# Patient Record
Sex: Female | Born: 1950 | Race: White | Hispanic: No | Marital: Single | Smoking: Never smoker
Health system: Southern US, Community
[De-identification: ages and names within clinical notes are randomized; demographics above are authoritative.]

## PROBLEM LIST (undated history)

## (undated) DIAGNOSIS — G43909 Migraine, unspecified, not intractable, without status migrainosus: Secondary | ICD-10-CM

## (undated) DIAGNOSIS — IMO0002 Reserved for concepts with insufficient information to code with codable children: Secondary | ICD-10-CM

## (undated) DIAGNOSIS — M1990 Unspecified osteoarthritis, unspecified site: Secondary | ICD-10-CM

## (undated) DIAGNOSIS — E282 Polycystic ovarian syndrome: Secondary | ICD-10-CM

## (undated) DIAGNOSIS — T148XXA Other injury of unspecified body region, initial encounter: Secondary | ICD-10-CM

## (undated) DIAGNOSIS — I10 Essential (primary) hypertension: Secondary | ICD-10-CM

## (undated) DIAGNOSIS — M797 Fibromyalgia: Secondary | ICD-10-CM

## (undated) DIAGNOSIS — S022XXA Fracture of nasal bones, initial encounter for closed fracture: Secondary | ICD-10-CM

## (undated) DIAGNOSIS — A0472 Enterocolitis due to Clostridium difficile, not specified as recurrent: Secondary | ICD-10-CM

## (undated) DIAGNOSIS — K5792 Diverticulitis of intestine, part unspecified, without perforation or abscess without bleeding: Secondary | ICD-10-CM

## (undated) DIAGNOSIS — K589 Irritable bowel syndrome without diarrhea: Secondary | ICD-10-CM

## (undated) DIAGNOSIS — E7800 Pure hypercholesterolemia, unspecified: Secondary | ICD-10-CM

## (undated) HISTORY — DX: Reserved for concepts with insufficient information to code with codable children: IMO0002

## (undated) HISTORY — DX: Fibromyalgia: M79.7

## (undated) HISTORY — PX: ROTATOR CUFF REPAIR: SHX139

## (undated) HISTORY — PX: SHOULDER SURGERY: SHX246

## (undated) HISTORY — DX: Unspecified osteoarthritis, unspecified site: M19.90

## (undated) HISTORY — DX: Enterocolitis due to Clostridium difficile, not specified as recurrent: A04.72

## (undated) HISTORY — DX: Other injury of unspecified body region, initial encounter: T14.8XXA

## (undated) HISTORY — DX: Fracture of nasal bones, initial encounter for closed fracture: S02.2XXA

---
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
## 1996-02-05 HISTORY — PX: CHOLECYSTECTOMY: SHX55

## 2010-09-27 ENCOUNTER — Encounter (HOSPITAL_BASED_OUTPATIENT_CLINIC_OR_DEPARTMENT_OTHER)
Admission: RE | Admit: 2010-09-27 | Discharge: 2010-09-27 | Disposition: A | Discharge status: Home / Self Care | Payer: Self-pay | Source: Ambulatory Visit | Attending: Orthopedic Surgery | Admitting: Orthopedic Surgery

## 2010-09-27 DIAGNOSIS — Z01812 Encounter for preprocedural laboratory examination: Secondary | ICD-10-CM | POA: Insufficient documentation

## 2010-09-27 DIAGNOSIS — Z01818 Encounter for other preprocedural examination: Secondary | ICD-10-CM | POA: Insufficient documentation

## 2010-09-27 LAB — BASIC METABOLIC PANEL
BUN: 15 mg/dL (ref 6–23)
CO2: 30 mEq/L (ref 19–32)
Calcium: 10.2 mg/dL (ref 8.4–10.5)
GFR calc non Af Amer: >60 mL/min (ref >60)
Glucose, Bld: 81 mg/dL (ref 70–99)
Potassium: 3.9 mEq/L (ref 3.5–5.1)
Sodium: 139 mEq/L (ref 135–145)

## 2010-10-01 ENCOUNTER — Ambulatory Visit (HOSPITAL_BASED_OUTPATIENT_CLINIC_OR_DEPARTMENT_OTHER)
Admission: RE | Admit: 2010-10-01 | Discharge: 2010-10-01 | Disposition: A | Discharge status: Home / Self Care | Payer: Worker's Compensation | Source: Ambulatory Visit | Attending: Orthopedic Surgery | Admitting: Orthopedic Surgery

## 2010-10-01 DIAGNOSIS — M719 Bursopathy, unspecified: Secondary | ICD-10-CM | POA: Insufficient documentation

## 2010-10-01 DIAGNOSIS — G43909 Migraine, unspecified, not intractable, without status migrainosus: Secondary | ICD-10-CM | POA: Insufficient documentation

## 2010-10-01 DIAGNOSIS — Z01812 Encounter for preprocedural laboratory examination: Secondary | ICD-10-CM | POA: Insufficient documentation

## 2010-10-01 DIAGNOSIS — I10 Essential (primary) hypertension: Secondary | ICD-10-CM | POA: Insufficient documentation

## 2010-10-01 DIAGNOSIS — Z01810 Encounter for preprocedural cardiovascular examination: Secondary | ICD-10-CM | POA: Insufficient documentation

## 2010-10-01 DIAGNOSIS — M25819 Other specified joint disorders, unspecified shoulder: Secondary | ICD-10-CM | POA: Insufficient documentation

## 2010-10-01 DIAGNOSIS — M67919 Unspecified disorder of synovium and tendon, unspecified shoulder: Secondary | ICD-10-CM | POA: Insufficient documentation

## 2010-10-01 DIAGNOSIS — M19019 Primary osteoarthritis, unspecified shoulder: Secondary | ICD-10-CM | POA: Insufficient documentation

## 2010-10-01 DIAGNOSIS — E119 Type 2 diabetes mellitus without complications: Secondary | ICD-10-CM | POA: Insufficient documentation

## 2010-10-01 LAB — GLUCOSE, CAPILLARY: Glucose-Capillary: 120 mg/dL — ABNORMAL HIGH (ref 70–99)

## 2010-10-03 NOTE — Op Note (Signed)
NAME:  [name], [name]            ACCOUNT NO.:  [id number]  MEDICAL RECORD NO.:  [id number]  LOCATION:                                 FACILITY:  PHYSICIAN:  Feliberto Gottron. Turner Daniels, M.D.   DATE OF BIRTH:  [birth year]  DATE OF PROCEDURE:  10/01/2010 DATE OF DISCHARGE:                              OPERATIVE REPORT   PREOPERATIVE DIAGNOSIS:  Right shoulder impingement syndrome, acromioclavicular joint arthritis, and partial thickness rotator cuff tear.  POSTOPERATIVE DIAGNOSIS:  Right shoulder impingement syndrome, acromioclavicular joint arthritis, and partial thickness rotator cuff tear.  PROCEDURES:  Right shoulder arthroscopic bursectomy, distal clavicle excision, anterior-inferior acromioplasty, debridement of external leaflet partial thickness rotator cuff tear.  SURGEON:  Feliberto Gottron. Turner Daniels, MD  FIRST ASSISTANT:  Shirl Harris, PA-C.  ANESTHESIA:  Interscalene block and general LMA.  ESTIMATED BLOOD LOSS:  Minimal.  FLUID REPLACEMENT:  800 mL of crystalloid.  DRAINS PLACED:  None.  TOURNIQUET TIME:  None.  INDICATIONS FOR PROCEDURE:  This is a 61 year old woman with previous history of right shoulder pain after injury at work.  MRI scan is consistent with a partial-thickness rotator cuff tear, possible pinhole tear in the infraspinatus.  She also has subacromial spurring type 2 about 7 mm in size, AC joint arthritis with subclavicular spurring as well.  She desires elective arthroscopic evaluation and treatment of her right shoulder.  Because of severe diabetes, she cannot take a cortisone injection and when she attempted physical therapy it only caused severe pain.  Risks and benefits of surgery were discussed and questions answered.  DESCRIPTION OF PROCEDURE:  The patient was identified by armband, taken to the holding area at [facility], and received preoperative IV antibiotics.  She also got a right shoulder interscalene block and was  taken to operating room 5, appropriate anesthetic monitors were attached, and general endotracheal anesthesia was induced with the patient in supine position.  She was then placed in the beach-chair position and the right upper extremity prepped and draped in the usual sterile fashion from the wrist to the hemithorax.  Time-out procedure was performed.  We began the operation by making standard portals 1.5 cm to the anterior [facility] joint, lateral to the junction of middle and posterior thirds acromion, posterior to the posterolateral corner of the acromion process.  The inflow was placed anteriorly, the arthroscope laterally, and a 4.2 [place] sucker shaver posteriorly allowing subacromial bursectomy.  We alternated with the ArthroCare wand to get hemostasis.  We immediately outlined a type 3 subacromial spur of the acromion and a subclavicular spur that was also quite impressive. Looking at the external leaflets of the rotator cuff, there was some superficial abrasion and little bit of tearing at the insertion, none of this was full-thickness and this was debrided back to a stable margin removing the outer 5-10% of the fibers.  Directing our attention back to the subacromial arch, we then removed the distal inferior centimeter of the clavicle with a 4.5 hooded vortex bur as well as the distal inferior tip of the acromion again with a 4.5 hooded vortex bur.  This created a flat type 1 acromion.  The [facility] joint itself was  noted to be arthritic and we then switched portals bringing the inflow in laterally, the scope posteriorly, and the bur anteriorly completing the distal clavicle excision.  We took one more look at the rotator cuff along its insertion and found it to be intact.  The arthroscope was then repositioned in the glenohumeral joint using the posterior portal where we documented normal glenohumeral cartilage, normal labrum, and normal biceps and biceps anchor.  The insertions of the  supraspinatus and infraspinatus were also noted to be intact.  At this point, the shoulder was irrigated out with normal saline solution.  The arthroscopic instruments were removed. Dressing of Xeroform, 4 x 4, dressing sponges, paper tape, and a sling was then applied.  The patient was awakened, extubated, and taken to the recovery room without difficulty.     Feliberto Gottron. Turner Daniels, M.D.     Ovid Curd  D:  10/01/2010  T:  10/01/2010  Job:  [id number]  Electronically Signed by Gean Birchwood M.D. on 10/03/2010 10:17:09 PM

## 2011-05-12 ENCOUNTER — Emergency Department (HOSPITAL_COMMUNITY): Payer: BC Managed Care – PPO

## 2011-05-12 ENCOUNTER — Encounter (HOSPITAL_COMMUNITY): Payer: Self-pay | Admitting: *Deleted

## 2011-05-12 ENCOUNTER — Emergency Department (HOSPITAL_COMMUNITY)
Admission: EM | Admit: 2011-05-12 | Discharge: 2011-05-12 | Disposition: A | Discharge status: Home / Self Care | Payer: BC Managed Care – PPO | Attending: Emergency Medicine | Admitting: Emergency Medicine

## 2011-05-12 DIAGNOSIS — R1031 Right lower quadrant pain: Secondary | ICD-10-CM | POA: Insufficient documentation

## 2011-05-12 DIAGNOSIS — E7800 Pure hypercholesterolemia, unspecified: Secondary | ICD-10-CM | POA: Insufficient documentation

## 2011-05-12 DIAGNOSIS — E119 Type 2 diabetes mellitus without complications: Secondary | ICD-10-CM | POA: Insufficient documentation

## 2011-05-12 DIAGNOSIS — R350 Frequency of micturition: Secondary | ICD-10-CM | POA: Insufficient documentation

## 2011-05-12 DIAGNOSIS — K5792 Diverticulitis of intestine, part unspecified, without perforation or abscess without bleeding: Secondary | ICD-10-CM

## 2011-05-12 DIAGNOSIS — K5732 Diverticulitis of large intestine without perforation or abscess without bleeding: Secondary | ICD-10-CM | POA: Insufficient documentation

## 2011-05-12 DIAGNOSIS — I10 Essential (primary) hypertension: Secondary | ICD-10-CM | POA: Insufficient documentation

## 2011-05-12 DIAGNOSIS — Z79899 Other long term (current) drug therapy: Secondary | ICD-10-CM | POA: Insufficient documentation

## 2011-05-12 HISTORY — DX: Pure hypercholesterolemia, unspecified: E78.00

## 2011-05-12 HISTORY — DX: Migraine, unspecified, not intractable, without status migrainosus: G43.909

## 2011-05-12 HISTORY — DX: Polycystic ovarian syndrome: E28.2

## 2011-05-12 HISTORY — DX: Essential (primary) hypertension: I10

## 2011-05-12 LAB — URINALYSIS, ROUTINE W REFLEX MICROSCOPIC
Glucose, UA: 250 mg/dL — AB
Ketones, ur: NEGATIVE mg/dL
Leukocytes, UA: NEGATIVE
Nitrite: NEGATIVE
Protein, ur: NEGATIVE mg/dL
Urobilinogen, UA: 0.2 mg/dL (ref 0.0–1.0)

## 2011-05-12 LAB — CBC
HCT: 41.2 % (ref 36.0–46.0)
Hemoglobin: 14.1 g/dL (ref 12.0–15.0)
MCV: 88.8 fL (ref 78.0–100.0)
WBC: 11.4 10*3/uL — ABNORMAL HIGH (ref 4.0–10.5)

## 2011-05-12 LAB — COMPREHENSIVE METABOLIC PANEL
BUN: 13 mg/dL (ref 6–23)
CO2: 30 mEq/L (ref 19–32)
Calcium: 9.8 mg/dL (ref 8.4–10.5)
Chloride: 101 mEq/L (ref 96–112)
Creatinine, Ser: 0.7 mg/dL (ref 0.50–1.10)
GFR calc Af Amer: >90 mL/min (ref >90)
GFR calc non Af Amer: >90 mL/min (ref >90)
Glucose, Bld: 114 mg/dL — ABNORMAL HIGH (ref 70–99)
Total Bilirubin: 0.5 mg/dL (ref 0.3–1.2)

## 2011-05-12 LAB — DIFFERENTIAL
Eosinophils Relative: 2 % (ref 0–5)
Lymphocytes Relative: 32 % (ref 12–46)
Monocytes Absolute: 1.1 10*3/uL — ABNORMAL HIGH (ref 0.1–1.0)
Monocytes Relative: 10 % (ref 3–12)
Neutro Abs: 6.4 10*3/uL (ref 1.7–7.7)

## 2011-05-12 LAB — LIPASE, BLOOD: Lipase: 34 U/L (ref 11–59)

## 2011-05-12 MED ORDER — HYDROCODONE-ACETAMINOPHEN 5-325 MG PO TABS: 2.0000 | ORAL_TABLET | ORAL | Status: AC | PRN

## 2011-05-12 MED ORDER — METRONIDAZOLE 500 MG PO TABS: 500.0000 mg | ORAL_TABLET | Freq: Three times a day (TID) | ORAL | Status: AC

## 2011-05-12 MED ORDER — CIPROFLOXACIN HCL 500 MG PO TABS: 500.0000 mg | ORAL_TABLET | Freq: Two times a day (BID) | ORAL | Status: AC

## 2011-05-12 NOTE — Discharge Instructions (Signed)
Your CT scan showed diverticulitis which is an infection of the colon mucosa. This is treated with antibiotics. You have been given a prescription for Cipro and Flagyl. Please take both of these until they are gone. The CT did show a "nonspecific lucency" of the L4 vertebra and that L4 is slipped slightly forward. Please call Dr. Tommas Olp office on Monday to schedule a follow up to recheck your abdomen and to discuss the CT findings. Return to the ER with worsening pain, high fever, abdominal swelling, or otherwise worsening condition.  RESOURCE GUIDE  Dental Problems  Patients with Medicaid: [facility] [phone] [address]                                           [phone] [address] Phone:  [phone]                                                  Phone:  [phone]  If unable to pay or uninsured, contact:  [facility] or [facility]. to become qualified for the adult dental clinic.  Chronic Pain Problems Contact [facility]  [phone] Patients need to be referred by their primary care doctor.  Insufficient Money for Medicine Contact [employer]:  call "211" or [facility] 419-785-4370.  No Primary Care Doctor Call [facility]  [phone] Other agencies that provide inexpensive medical care    [facility]  [phone]    [facility]  [phone]    [facility]  [phone]    [facility]  [phone]    [facility]  [phone]    [facility]  [phone]  Psychological Services [facility] Behavioral Health  [phone] [facility]  [phone] [facility] Mental [facility]   [phone] (emergency services [phone])  Substance Abuse Resources [facility]  [phone] [facility] 212-612-1260 [facility] 772-121-3000 Floydene Flock [phone] Residential & Outpatient Substance Abuse Program   ([phone]  Abuse/Neglect [facility] Child Abuse Hotline ([phone] 138-2081 [facility] Child Abuse Hotline 403-100-2100 (After Hours)  Emergency Shelter [facility] 5046425330  Maternity Homes [facility] [phone] [facility] [phone]  MRSA Hotline #:   434-175-5144    [facility] Resources  [facility]     [employer]                          [facility] Dept. [address] [place]                       [address]      [address]  Patrecia Pace  Michell Heinrich Phone:  [phone]                                   Phone:  [phone]                 Phone:  [phone]  [facility] Mental Health Phone:  [phone]  [facility] Child Abuse Hotline 819-460-3041 (867)061-8091 (After Hours)  Diverticulitis A diverticulum is a small pouch or sac on the colon. Diverticulosis is the presence of these diverticula on the colon. Diverticulitis is the irritation (inflammation) or infection of diverticula. CAUSES  The colon and its diverticula contain bacteria. If food particles block the tiny opening to a diverticulum, the bacteria inside can grow and cause an increase in pressure. This leads to infection and inflammation and is called diverticulitis. SYMPTOMS   Abdominal pain and tenderness. Usually, the pain is located on the left side of your abdomen. However, it could be located elsewhere.   Fever.   Bloating.   Feeling sick to your stomach (nausea).   Throwing up (vomiting).   Abnormal stools.  DIAGNOSIS  Your caregiver will take a history and perform a physical exam. Since many things can cause abdominal pain, other tests may be necessary. Tests may include:  Blood tests.   Urine tests.   X-ray of the abdomen.   CT scan of the abdomen.  Sometimes, surgery is  needed to determine if diverticulitis or other conditions are causing your symptoms. TREATMENT  Most of the time, you can be treated without surgery. Treatment includes:  Resting the bowels by only having liquids for a few days. As you improve, you will need to eat a low-fiber diet.   Intravenous (IV) fluids if you are losing body fluids (dehydrated).   Antibiotic medicines that treat infections may be given.   Pain and nausea medicine, if needed.   Surgery if the inflamed diverticulum has burst.  HOME CARE INSTRUCTIONS   Try a clear liquid diet (broth, tea, or water for as long as directed by your caregiver). You may then gradually begin a low-fiber diet as tolerated. A low-fiber diet is a diet with less than 10 grams of fiber. Choose the foods below to reduce fiber in the diet:   White breads, cereals, rice, and pasta.   Cooked fruits and vegetables or soft fresh fruits and vegetables without the skin.   Ground or well-cooked tender beef, ham, veal, lamb, pork, or poultry.   Eggs and seafood.   After your diverticulitis symptoms have improved, your caregiver may put you on a high-fiber diet. A high-fiber diet includes 14 grams of fiber for every 1000 calories consumed. For a standard 2000 calorie diet, you would need 28 grams of fiber. Follow these diet guidelines to help you increase the fiber in your diet. It is important to slowly increase the amount fiber in your diet to avoid gas, constipation, and bloating.   Choose whole-grain breads, cereals, pasta, and brown rice.   Choose fresh fruits and vegetables with the skin on. Do not overcook vegetables because the more vegetables are cooked, the more fiber is lost.   Choose more nuts, seeds, legumes, dried peas, beans, and lentils.   Look for food products that have greater than 3 grams of fiber per serving on the Nutrition Facts label.   Take all medicine as directed by your caregiver.   If your caregiver has given you  a  follow-up appointment, it is very important that you go. Not going could result in lasting (chronic) or permanent injury, pain, and disability. If there is any problem keeping the appointment, call to reschedule.  SEEK MEDICAL CARE IF:   Your pain does not improve.   You have a hard time advancing your diet beyond clear liquids.   Your bowel movements do not return to normal.  SEEK IMMEDIATE MEDICAL CARE IF:   Your pain becomes worse.   You have an oral temperature above 102 F (38.9 C), not controlled by medicine.   You have repeated vomiting.   You have bloody or black, tarry stools.   Symptoms that brought you to your caregiver become worse or are not getting better.  MAKE SURE YOU:   Understand these instructions.   Will watch your condition.   Will get help right away if you are not doing well or get worse.  Document Released: 10/31/2004 Document Revised: 01/10/2011 Document Reviewed: 02/26/2010 [facility] Patient Information 2012 [place], [place].

## 2011-05-12 NOTE — ED Notes (Signed)
MD/PA at bedside. 

## 2011-05-12 NOTE — ED Notes (Signed)
Pt reports she was sent her from [facility]. Pt thought she had a UTI and was seen at same Urgent Care on 3/29.  Pt diagnosed with UTI and took antibiotic and finished Friday. Pt reports symptoms returned. Pt had also stopped muscle relaxer on Wednesday. Pt reports bladder spasm and lower bilateral back pain. Pt sent here for evaluation of her spine. Pt has pictures on CD from urgent care and MD was concerned that patient may have compression in her lower vertebrae causing frequent urination.

## 2011-05-12 NOTE — ED Provider Notes (Signed)
8:00 PM  Signout from [place], [place]. Pt presents with abd cramping. Went to UC earlier today where XR was performed of LS which was of poor quality but showed ?lucency of the lower lumbar vertebrae and she was told that she may have a compression fx. She is awaiting CT without contrast of abd/pel to further eval abd and LS findings.  9:58 PM CT abd/pel resulted. Evidence of diverticulitis without abscess. Sm lucencies in T12 and L4, unclear etiology. I discussed findings with patient. She has a PCP, Dr. Zachery Dauer with [facility]. She is to call tomorrow to make an appt with her to discuss further workup. Will start on cipro/flagyl for abx coverage for diverticulitis. Pt comfortable currently, abd soft. She verbalized understanding and agreed to plan. Return precautions discussed.  Results for orders placed during the hospital encounter of 05/12/11  CBC      Component Value Range   WBC 11.4 (*) 4.0 - 10.5 (K/uL)   RBC 4.64  3.87 - 5.11 (MIL/uL)   Hemoglobin 14.1  12.0 - 15.0 (g/dL)   HCT 16.1  09.6 - 04.5 (%)   MCV 88.8  78.0 - 100.0 (fL)   MCH 30.4  26.0 - 34.0 (pg)   MCHC 34.2  30.0 - 36.0 (g/dL)   RDW 40.9  81.1 - 91.4 (%)   Platelets 203  150 - 400 (K/uL)  DIFFERENTIAL      Component Value Range   Neutrophils Relative 56  43 - 77 (%)   Neutro Abs 6.4  1.7 - 7.7 (K/uL)   Lymphocytes Relative 32  12 - 46 (%)   Lymphs Abs 3.6  0.7 - 4.0 (K/uL)   Monocytes Relative 10  3 - 12 (%)   Monocytes Absolute 1.1 (*) 0.1 - 1.0 (K/uL)   Eosinophils Relative 2  0 - 5 (%)   Eosinophils Absolute 0.2  0.0 - 0.7 (K/uL)   Basophils Relative 0  0 - 1 (%)   Basophils Absolute 0.0  0.0 - 0.1 (K/uL)  COMPREHENSIVE METABOLIC PANEL      Component Value Range   Sodium 140  135 - 145 (mEq/L)   Potassium 3.6  3.5 - 5.1 (mEq/L)   Chloride 101  96 - 112 (mEq/L)   CO2 30  19 - 32 (mEq/L)   Glucose, Bld 114 (*) 70 - 99 (mg/dL)   BUN 13  6 - 23 (mg/dL)   Creatinine, Ser 7.82  0.50 - 1.10 (mg/dL)   Calcium 9.8  8.4 -  10.5 (mg/dL)   Total Protein 7.5  6.0 - 8.3 (g/dL)   Albumin 3.7  3.5 - 5.2 (g/dL)   AST 12  0 - 37 (U/L)   ALT 10  0 - 35 (U/L)   Alkaline Phosphatase 105  39 - 117 (U/L)   Total Bilirubin 0.5  0.3 - 1.2 (mg/dL)   GFR calc non Af Amer >90  >90 (mL/min)   GFR calc Af Amer >90  >90 (mL/min)  LIPASE, BLOOD      Component Value Range   Lipase 34  11 - 59 (U/L)  URINALYSIS, ROUTINE W REFLEX MICROSCOPIC      Component Value Range   Color, Urine YELLOW  YELLOW    APPearance CLEAR  CLEAR    Specific Gravity, Urine 1.025  1.005 - 1.030    pH 6.0  5.0 - 8.0    Glucose, UA 250 (*) NEGATIVE (mg/dL)   Hgb urine dipstick NEGATIVE  NEGATIVE    Bilirubin Urine  NEGATIVE  NEGATIVE    Ketones, ur NEGATIVE  NEGATIVE (mg/dL)   Protein, ur NEGATIVE  NEGATIVE (mg/dL)   Urobilinogen, UA 0.2  0.0 - 1.0 (mg/dL)   Nitrite NEGATIVE  NEGATIVE    Leukocytes, UA NEGATIVE  NEGATIVE    Ct Abdomen Pelvis Wo Contrast  05/12/2011  *RADIOLOGY REPORT*  Clinical Data: Abdominal pain right lower quadrant and suprapubic region.  Outside film raised question of lucency L4 and L5.  CT ABDOMEN AND PELVIS WITHOUT CONTRAST  Technique:  Multidetector CT imaging of the abdomen and pelvis was performed following the standard protocol without intravenous contrast.  Comparison: Outside plain film examination not available.  Findings: Small lucencies within the T12 and L4 vertebral body. Etiology indeterminate.  In the proper clinical setting, metastatic disease or myeloma not excluded.  L4 bilateral facet joint degenerative changes with the elongated pars.  6 mm anterior slip of L4.  Sigmoid diverticula with inflammation medially consistent with diverticulitis without drainable abscess.  Post cholecystectomy.  Evaluation of solid abdominal viscera is limited by lack of IV contrast.  Taking this limitation into account no focal hepatic, splenic, pancreatic, adrenal or renal lesion.  Atherosclerotic type changes of the aorta with mild  ectasia without aneurysmal dilation.  Lung bases clear.  Urinary bladder, uterus and ovaries without gross abnormality.  No pelvic/abdominal adenopathy.  IMPRESSION: Sigmoid diverticulitis without drainable abscess.  Nonspecific subtle  lucencies within T12 and L4.  Please see above.  Original Report Authenticated By: Fuller Canada, M.D.      Grant Font[place], [place] 05/12/11 2202

## 2011-05-12 NOTE — ED Notes (Signed)
Patient transported to CT and returned 

## 2011-05-12 NOTE — ED Notes (Signed)
Pt sent to ED today from urgent care with complaint of lower abdominal and lower back pain. Pt reports pain currently a 3 out of 10, with it being a 6 out of 10 in the waiting room.  Pt denies N/V/D.  Pt was treated 1 week ago for UTI, finished antibiotics on Friday (05/10/11). Pt reports that pain began to increase on Friday. Pain is cramping, intermittent, and onset is frequently associated with need to have a bowel movement. Pt reports that walking around seems to ease but not eliminate the pain.

## 2011-05-12 NOTE — ED Provider Notes (Signed)
History     CSN: [id number]  Arrival date & time 05/12/11  1425  5:54 PM HPI Patient reports since Friday she thought she had a urinary tract infection. States symptoms include abdominal cramping. Went to [facility] urgent care and received Macrobid and an antispasmodic. Reports he finished his Antibiotics but still has severe abdominal cramping. States she went back to urgent care and was recommended to come to the emergency department after having lumbar x-rays. States she was told she may have a compression fracture in her last 2 vertebrae.  Patient is a 61 y.o. female presenting with cramps.  Abdominal Cramping The primary symptoms of the illness include abdominal pain. The primary symptoms of the illness do not include fever, nausea, vomiting, diarrhea, hematemesis, hematochezia, dysuria, vaginal discharge or vaginal bleeding. The onset of the illness was gradual. The problem has been gradually worsening.  The abdominal pain is located in the RLQ and suprapubic region. The abdominal pain radiates to the left flank, right flank and back. The severity of the abdominal pain is 10/10. The abdominal pain is relieved by nothing. The abdominal pain is exacerbated by movement.  Additional symptoms associated with the illness include urgency and back pain. Symptoms associated with the illness do not include chills, anorexia, diaphoresis, heartburn, constipation, hematuria or frequency.    Past Medical History  Diagnosis Date  . Hypertension   . Diabetes mellitus   . Hypercholesteremia   . 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 Migraines     Past Surgical History  Procedure Date  . Shoulder surgery   . Cholecystectomy   . Tonsillectomy     No family history on file.  History  Substance Use Topics  . Smoking status: Never Smoker   . Smokeless tobacco: Not on file  . Alcohol Use: No    OB History    Grav Para Term Preterm Abortions TAB SAB Ect Mult Living                  Review of Systems    Constitutional: Negative for fever, chills and diaphoresis.  HENT: Negative for neck pain and neck stiffness.   Cardiovascular: Negative for chest pain.  Gastrointestinal: Positive for abdominal pain. Negative for heartburn, nausea, vomiting, diarrhea, constipation, hematochezia, anorexia and hematemesis.  Genitourinary: Positive for urgency. Negative for dysuria, frequency, hematuria, flank pain, vaginal bleeding, vaginal discharge and pelvic pain.  Musculoskeletal: Positive for back pain.       Denies saddle anesthesias, or perineal numbness, urinary or bowel incontinence  Neurological: Negative for weakness, numbness and headaches.  All other systems reviewed and are negative.    Allergies  Azithromycin; Contrast media; and Prednisone  Home Medications   Current Outpatient Rx  Name Route Sig Dispense Refill  . ACETAMINOPHEN-CODEINE #3 300-30 MG PO TABS Oral Take 1 tablet by mouth every 4 (four) hours as needed. pain    . VITAMIN D 1000 UNITS PO TABS Oral Take 1,000 Units by mouth daily.    . CYCLOBENZAPRINE HCL 10 MG PO TABS Oral Take 5 mg by mouth 3 (three) times daily as needed. Muscle spasm    . GLIPIZIDE-METFORMIN HCL 2.5-500 MG PO TABS Oral Take 0.5 tablets by mouth 2 (two) times daily before a meal.    . HYDROCHLOROTHIAZIDE 25 MG PO TABS Oral Take 12.5 mg by mouth daily.    Marland Kitchen HYDROCODONE-ACETAMINOPHEN 7.5-500 MG PO TABS Oral Take 1 tablet by mouth every 6 (six) hours as needed. migraines    . LOVASTATIN 40  MG PO TABS Oral Take 60 mg by mouth daily.    Marland Kitchen NADOLOL 40 MG PO TABS Oral Take 40 mg by mouth daily.      BP 155/78  Pulse 80  Temp(Src) 98.7 F (37.1 C) (Oral)  Resp 16  SpO2 100%  Physical Exam  Vitals reviewed. Constitutional: She is oriented to person, place, and time. Vital signs are normal. She appears well-developed and well-nourished.  HENT:  Head: Normocephalic and atraumatic.  Eyes: Conjunctivae are normal. Pupils are equal, round, and reactive to  light.  Neck: Normal range of motion. Neck supple.  Cardiovascular: Normal rate, regular rhythm and normal heart sounds.  Exam reveals no friction rub.   No murmur heard. Pulmonary/Chest: Effort normal and breath sounds normal. She has no wheezes. She has no rhonchi. She has no rales. She exhibits no tenderness.  Abdominal: Soft. Bowel sounds are normal. She exhibits no distension and no mass. There is tenderness (lower abdominal pain). There is no rebound, no guarding and no CVA tenderness.  Musculoskeletal: Normal range of motion.       Lumbar back: She exhibits normal range of motion, no tenderness, no bony tenderness and no spasm.  Neurological: She is alert and oriented to person, place, and time. Coordination normal.  Skin: Skin is warm and dry. No rash noted. No erythema. No pallor.    ED Course  Procedures   Results for orders placed during the hospital encounter of 05/12/11  CBC      Component Value Range   WBC 11.4 (*) 4.0 - 10.5 (K/uL)   RBC 4.64  3.87 - 5.11 (MIL/uL)   Hemoglobin 14.1  12.0 - 15.0 (g/dL)   HCT 16.1  09.6 - 04.5 (%)   MCV 88.8  78.0 - 100.0 (fL)   MCH 30.4  26.0 - 34.0 (pg)   MCHC 34.2  30.0 - 36.0 (g/dL)   RDW 40.9  81.1 - 91.4 (%)   Platelets 203  150 - 400 (K/uL)  DIFFERENTIAL      Component Value Range   Neutrophils Relative 56  43 - 77 (%)   Neutro Abs 6.4  1.7 - 7.7 (K/uL)   Lymphocytes Relative 32  12 - 46 (%)   Lymphs Abs 3.6  0.7 - 4.0 (K/uL)   Monocytes Relative 10  3 - 12 (%)   Monocytes Absolute 1.1 (*) 0.1 - 1.0 (K/uL)   Eosinophils Relative 2  0 - 5 (%)   Eosinophils Absolute 0.2  0.0 - 0.7 (K/uL)   Basophils Relative 0  0 - 1 (%)   Basophils Absolute 0.0  0.0 - 0.1 (K/uL)  COMPREHENSIVE METABOLIC PANEL      Component Value Range   Sodium 140  135 - 145 (mEq/L)   Potassium 3.6  3.5 - 5.1 (mEq/L)   Chloride 101  96 - 112 (mEq/L)   CO2 30  19 - 32 (mEq/L)   Glucose, Bld 114 (*) 70 - 99 (mg/dL)   BUN 13  6 - 23 (mg/dL)    Creatinine, Ser 7.82  0.50 - 1.10 (mg/dL)   Calcium 9.8  8.4 - 95.6 (mg/dL)   Total Protein 7.5  6.0 - 8.3 (g/dL)   Albumin 3.7  3.5 - 5.2 (g/dL)   AST 12  0 - 37 (U/L)   ALT 10  0 - 35 (U/L)   Alkaline Phosphatase 105  39 - 117 (U/L)   Total Bilirubin 0.5  0.3 - 1.2 (mg/dL)   GFR calc  non Af Amer >90  >90 (mL/min)   GFR calc Af Amer >90  >90 (mL/min)  LIPASE, BLOOD      Component Value Range   Lipase 34  11 - 59 (U/L)  URINALYSIS, ROUTINE W REFLEX MICROSCOPIC      Component Value Range   Color, Urine YELLOW  YELLOW    APPearance CLEAR  CLEAR    Specific Gravity, Urine 1.025  1.005 - 1.030    pH 6.0  5.0 - 8.0    Glucose, UA 250 (*) NEGATIVE (mg/dL)   Hgb urine dipstick NEGATIVE  NEGATIVE    Bilirubin Urine NEGATIVE  NEGATIVE    Ketones, ur NEGATIVE  NEGATIVE (mg/dL)   Protein, ur NEGATIVE  NEGATIVE (mg/dL)   Urobilinogen, UA 0.2  0.0 - 1.0 (mg/dL)   Nitrite NEGATIVE  NEGATIVE    Leukocytes, UA NEGATIVE  NEGATIVE     MDM  Patient to [facility]. Report Dr. Eugenia Pancoast stated potential lytic lesion L4 and L5. Unable to determine this from CD imaging sent from urgent care. We'll further evaluate with CT abdomen/pelvis to evaluate for abdominal pathology and lumbar spine pathology. Discussed plan with Dr. Burns Spain, PA-C 05/12/11 2003

## 2011-05-12 NOTE — ED Notes (Signed)
Pt reports that her CBG this morning was 184. Pt refused to have CBG checked today in ED.

## 2011-05-13 NOTE — ED Provider Notes (Signed)
Medical screening examination/treatment/procedure(s) were performed by non-physician practitioner and as supervising physician I was immediately available for consultation/collaboration.   Loren Racer, MD 05/13/11 [phone]

## 2011-05-13 NOTE — ED Provider Notes (Signed)
Medical screening examination/treatment/procedure(s) were performed by non-physician practitioner and as supervising physician I was immediately available for consultation/collaboration.   Loren Racer, MD 05/13/11 [phone]

## 2011-05-14 LAB — URINE CULTURE: Colony Count: 30000

## 2011-05-24 ENCOUNTER — Other Ambulatory Visit: Payer: Self-pay | Admitting: Family Medicine

## 2011-05-24 DIAGNOSIS — R9389 Abnormal findings on diagnostic imaging of other specified body structures: Secondary | ICD-10-CM

## 2011-05-28 ENCOUNTER — Other Ambulatory Visit: Payer: Self-pay | Admitting: Family Medicine

## 2011-05-28 ENCOUNTER — Ambulatory Visit
Admission: RE | Admit: 2011-05-28 | Discharge: 2011-05-28 | Disposition: A | Discharge status: Home / Self Care | Payer: BC Managed Care – PPO | Source: Ambulatory Visit | Attending: Family Medicine | Admitting: Family Medicine

## 2011-05-28 DIAGNOSIS — R9389 Abnormal findings on diagnostic imaging of other specified body structures: Secondary | ICD-10-CM

## 2011-12-18 ENCOUNTER — Other Ambulatory Visit: Payer: Self-pay | Admitting: Cardiology

## 2011-12-18 MED ORDER — SODIUM CHLORIDE 0.9 % IV SOLN: 125.0000 mg | Freq: Once | INTRAVENOUS | Status: DC

## 2011-12-18 NOTE — H&P (Signed)
Patient: [name], [name] Provider: Donato Schultz, MD  DOB: Aug 08, 1950 Age: 61 Y Sex: Female Date: 12/18/2011  Phone: [phone]   Address: [address] [place], [place], [place]  Pcp: ELIZABETH BARNES       Subjective:     CC:    1. MS/stress test f/u.        HPI:  General:  61 year old here for evaluation of chest pain with medical history of diabetes, hypertension, hyperlipidemia. She's had a few episodes of substernal chest discomfort with associated shortness of breath. She had some episodes while walking uphill, on vacation. She had pressure while mowing the lawn a few years ago 2003 and because of the stopped mowing. Pressure like running into a wall. She was in [place]. A week after getting back felt mild discomfort when carry groceries. Hemoglobin A1c 7.4. EKG on 11/15/11 shows sinus rhythm rate 60 with no other abnormalities. LDL cholesterol 94.  Underwent stress test which demonstrated low to intermediate risk with distal inferolateral wall ischemia, normal EF. We discussed cardiac catheterization today. She was quite emotional..        ROS:  No bleeding, no syncope. Positive muscle twitches. All others negative.       Medical History: Diabetes, Hyperlipidemia, Hypertension, Fibromyalgia, History of Vitamin D deficiency, [place] syndrome.        Surgical History: right shoulder surgery 10/01/10 .        Family History: Father: CHF  Adopted.       Social History:  General:  History of smoking  cigarettes: Never smoked no Smoking, never.  no Alcohol.  no Recreational drug use.  Occupation: Music, worship, enjoys Archivist.        Medications: Vitamin D 2000 IU Tablet 1 tablet Once a day, Accu-Chek Aviva Not Specified Strip TEST AS DIRECTED DAILY , Flexeril 10 MG Tablet 1/4 to 1 pill up to three times per day, GlipiZIDE-Metformin HCl 2.5-500 MG Tablet 1/2 tab twice a day, Hydrochlorothiazide 25 MG Tablet 1/2 tablet Once a day, Nadolol 40 MG Not  Specified Tablet TAKE ONE TABLET BY MOUTH ONE TIME DAILY , Lovastatin 40 MG Tablet 1 1/2 tablet with a meal qd, Lortab 7.5 7.5-500 MG Tablet 1 tablet as needed for pain every 6 hrs prn, Tylenol/Codeine #3 300-30 MG Tablet 1 tablet bid prn pain, Medication List reviewed and reconciled with the patient       Allergies: steroids, aspirin, Sulfa drugs (for allergy), Erythromycin: Hives, Naprosyn: nose bleeds and stomach upset, Augmentin: headaches and nausea, All Nsaids, IV DYE: hives, difficulty breathing.       Objective:     Vitals: Wt Refused, Pulse sitting 80, BP sitting 144/90.       Examination:  General Examination:  GENERAL APPEARANCE alert, oriented, NAD, pleasant, anxious.  SKIN: normal, no rash.  HEENT: normal.  HEAD: Downs/AT.  EYES: EOMI, Conjunctiva clear.  NECK: supple, FROM, without evidence of thyromegaly, adenopathy, or bruits, no jugular venous distention (JVD).  LUNGS: clear to auscultation bilaterally, no wheezes, rhonchi, rales, regular breathing rate and effort.  HEART: regular rate and rhythm, no S3, S4, murmur or rub, point of maximul impulse (PMI) normal.  ABDOMEN: soft, non-tender/non-distended, bowel sounds present, no masses palpated, no bruit.  EXTREMITIES: no clubbing, no edema, pulses 2 plus bilaterally.  NEUROLOGIC EXAM: non-focal exam, alert and oriented x 3.  PERIPHERAL PULSES: normal (2+) bilaterally.  LYMPH NODES: no cervical adenopathy.  PSYCH affect normal.  PE done at last visit. Radial pulse 2+, Prior  EKG, lab work, medical records reviewed.       Assessment:     Assessment:  1. Other nonspecific abnormal cardiovascular system function study - 794.39 (Primary)  2. Chest heaviness - 786.59  3. Hypercholesterolemia, Mixed - 272.2  4. Essential hypertension, benign - 401.1  5. 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 Other nonspecific abnormal cardiovascular system function study  LAB: Basic Metabolic    GLUCOSE 224  70-99 - mg/dL  H   BUN 16  1-61 - mg/dL    CREATININE 0.96  0.45-4.09 - mg/dl    eGFR (NON-AFRICAN AMERICAN) 76 >60 - calc    eGFR (AFRICAN AMERICAN) 92 >60 - calc    SODIUM 139  136-145 - mmol/L    POTASSIUM 3.9  3.5-5.5 - mmol/L    CHLORIDE 103  98-107 - mmol/L    C02 30  22-32 - mmol/L    ANION GAP 10.0 6.0-20.0 - mmol/L    CALCIUM 9.4  8.6-10.3 - mg/dL     [facility] 81/19/1478 12:38:56 PM > ok for cath    LAB: CBC with Diff    WBC 7.8 4.0-11.0 - K/ul    RBC 4.65 4.20-5.40 - M/uL    HGB 14.3 12.0-16.0 - g/dL    HCT 29.5 62.1-30.8 - %    MCH 30.8 27.0-33.0 - pg    MPV 6.8 7.5-10.7 - fL L   MCV 87.4 81.0-99.0 - fL    MCHC 35.2 32.0-36.0 - g/dL    RDW 65.7 84.6-96.2 - %    NRBC# 0.00 -    PLT 209 150-400 - K/uL    NEUT % 53.4 43.3-71.9 - %    NRBC% 0.00 - %    LYMPH% 35.6 16.8-43.5 - %    MONO % 8.0 4.6-12.4 - %    EOS % 2.0 0.0-7.8 - %    BASO % 1.0 0.0-1.0 - %    NEUT # 4.1 1.9-7.2 - K/uL    LYMPH# 2.80 1.10-2.70 - K/uL H   MONO # 0.6 0.3-0.8 - K/uL    EOS # 0.2 0.0-0.6 - K/uL    BASO # 0.1 0.0-0.1 - K/uL     Reverie Vaquera 12/18/2011 12:38:56 PM > ok for cath    LAB: PT (Prothrombin Time) (952841)    Prothrombin Time 9.5 9.1-12.0 - SEC    INR 0.9 0.8-1.2 -     Baelynn Schmuhl 12/18/2011 12:38:56 PM > ok for cath    Low to intermediate risk study with inferolateral distal ischemia. Anginal class II. Diabetic. We will proceed with catheterization. Risks and benefits of cardiac catheterization have been reviewed including risk of stroke, heart attack, death, bleeding, renal impariment and arterial damage. There was ample oppurtuny to answer questions. Alternatives were discussed. Patient understands and wishes to proceed.  Radial access. Will give Solumedrol, in 1980's had hives with CT scan.        Procedures:  Venipuncture:  Venipuncture: performed in right arm, performed in right arm, Howell,Kathleen 12/18/2011 10:45:22 AM > .        Immunizations:        Labs:         Procedure Codes: 32440 ECL BMP, 85025 ECL CBC PLATELET DIFF, 10272 BLOOD COLLECTION ROUTINE VENIPUNCTURE       Preventive:         Follow Up: post cath      Provider: Donato Schultz, MD  Patient: [name], [name] DOB: [birth year] Date: 12/18/2011

## 2011-12-19 ENCOUNTER — Encounter (HOSPITAL_COMMUNITY)
Admission: RE | Disposition: A | Discharge status: Home / Self Care | Payer: Self-pay | Source: Ambulatory Visit | Attending: Cardiology

## 2011-12-19 ENCOUNTER — Ambulatory Visit (HOSPITAL_COMMUNITY)
Admission: RE | Admit: 2011-12-19 | Discharge: 2011-12-19 | Disposition: A | Discharge status: Home / Self Care | Payer: BC Managed Care – PPO | Source: Ambulatory Visit | Attending: Cardiology | Admitting: Cardiology

## 2011-12-19 DIAGNOSIS — E1165 Type 2 diabetes mellitus with hyperglycemia: Secondary | ICD-10-CM | POA: Diagnosis present

## 2011-12-19 DIAGNOSIS — I10 Essential (primary) hypertension: Secondary | ICD-10-CM | POA: Insufficient documentation

## 2011-12-19 DIAGNOSIS — R9439 Abnormal result of other cardiovascular function study: Secondary | ICD-10-CM | POA: Diagnosis present

## 2011-12-19 DIAGNOSIS — R079 Chest pain, unspecified: Secondary | ICD-10-CM | POA: Insufficient documentation

## 2011-12-19 DIAGNOSIS — E785 Hyperlipidemia, unspecified: Secondary | ICD-10-CM | POA: Insufficient documentation

## 2011-12-19 DIAGNOSIS — R0602 Shortness of breath: Secondary | ICD-10-CM | POA: Insufficient documentation

## 2011-12-19 DIAGNOSIS — E119 Type 2 diabetes mellitus without complications: Secondary | ICD-10-CM | POA: Insufficient documentation

## 2011-12-19 DIAGNOSIS — E282 Polycystic ovarian syndrome: Secondary | ICD-10-CM | POA: Insufficient documentation

## 2011-12-19 HISTORY — PX: LEFT HEART CATHETERIZATION WITH CORONARY ANGIOGRAM: SHX5451

## 2011-12-19 LAB — GLUCOSE, CAPILLARY: Glucose-Capillary: 110 mg/dL — ABNORMAL HIGH (ref 70–99)

## 2011-12-19 SURGERY — LEFT HEART CATHETERIZATION WITH CORONARY ANGIOGRAM
Anesthesia: LOCAL

## 2011-12-19 MED ORDER — SODIUM CHLORIDE 0.9 % IV SOLN: 1.0000 mL/kg/h | INTRAVENOUS | Status: AC

## 2011-12-19 MED ORDER — FAMOTIDINE IN NACL 20-0.9 MG/50ML-% IV SOLN
20.0000 mg | INTRAVENOUS | Status: AC
  Administered 2011-12-19: 20 mg via INTRAVENOUS
  Filled 2011-12-19: qty 50

## 2011-12-19 MED ORDER — ACETAMINOPHEN 325 MG PO TABS: 650.0000 mg | ORAL_TABLET | ORAL | Status: DC | PRN

## 2011-12-19 MED ORDER — METHYLPREDNISOLONE SODIUM SUCC 125 MG IJ SOLR
INTRAMUSCULAR | Status: AC
  Filled 2011-12-19: qty 2

## 2011-12-19 MED ORDER — HEPARIN (PORCINE) IN NACL 2-0.9 UNIT/ML-% IJ SOLN
INTRAMUSCULAR | Status: AC
  Filled 2011-12-19: qty 1000

## 2011-12-19 MED ORDER — LIDOCAINE HCL (PF) 1 % IJ SOLN
INTRAMUSCULAR | Status: AC
  Filled 2011-12-19: qty 30

## 2011-12-19 MED ORDER — DIPHENHYDRAMINE HCL 50 MG/ML IJ SOLN
25.0000 mg | INTRAMUSCULAR | Status: AC
  Administered 2011-12-19: 25 mg via INTRAVENOUS
  Filled 2011-12-19: qty 1

## 2011-12-19 MED ORDER — ASPIRIN 81 MG PO CHEW
324.0000 mg | CHEWABLE_TABLET | ORAL | Status: AC
  Administered 2011-12-19: 324 mg via ORAL
  Filled 2011-12-19: qty 4

## 2011-12-19 MED ORDER — NITROGLYCERIN 0.2 MG/ML ON CALL CATH LAB
INTRAVENOUS | Status: AC
  Filled 2011-12-19: qty 1

## 2011-12-19 MED ORDER — ONDANSETRON HCL 4 MG/2ML IJ SOLN: 4.0000 mg | Freq: Four times a day (QID) | INTRAMUSCULAR | Status: DC | PRN

## 2011-12-19 MED ORDER — SODIUM CHLORIDE 0.9 % IV SOLN
INTRAVENOUS | Status: DC
  Administered 2011-12-19: 09:00:00 via INTRAVENOUS

## 2011-12-19 MED ORDER — FENTANYL CITRATE 0.05 MG/ML IJ SOLN
INTRAMUSCULAR | Status: AC
  Filled 2011-12-19: qty 2

## 2011-12-19 MED ORDER — SODIUM CHLORIDE 0.9 % IV SOLN: 250.0000 mL | INTRAVENOUS | Status: DC | PRN

## 2011-12-19 MED ORDER — DIAZEPAM 5 MG PO TABS
5.0000 mg | ORAL_TABLET | ORAL | Status: AC
  Administered 2011-12-19: 5 mg via ORAL
  Filled 2011-12-19: qty 1

## 2011-12-19 MED ORDER — SODIUM CHLORIDE 0.9 % IJ SOLN: 3.0000 mL | Freq: Two times a day (BID) | INTRAMUSCULAR | Status: DC

## 2011-12-19 MED ORDER — SODIUM CHLORIDE 0.9 % IJ SOLN: 3.0000 mL | INTRAMUSCULAR | Status: DC | PRN

## 2011-12-19 MED ORDER — MIDAZOLAM HCL 2 MG/2ML IJ SOLN
INTRAMUSCULAR | Status: AC
  Filled 2011-12-19: qty 2

## 2011-12-19 NOTE — Interval H&P Note (Signed)
History and Physical Interval Note:  12/19/2011 11:13 AM  [name]  has presented today for surgery, with the diagnosis of abnormal stress test  The various methods of treatment have been discussed with the patient and family. After consideration of risks, benefits and other options for treatment, the patient has consented to  Procedure(s) (LRB) with comments: LEFT HEART CATHETERIZATION WITH CORONARY ANGIOGRAM (N/A) as a surgical intervention .  The patient's history has been reviewed, patient examined, no change in status, stable for surgery.  I have reviewed the patient's chart and labs.  Questions were answered to the patient's satisfaction.     SKAINS, MARK

## 2011-12-19 NOTE — CV Procedure (Signed)
CARDIAC CATHETERIZATION  PROCEDURE:  Left heart catheterization with selective coronary angiography, left ventriculogram.  INDICATIONS:  61 year old female with diabetes who recently was experiencing chest heaviness during heavy exertion on a trip to [place]. Nuclear stress test was performed and showed a distal anterolateral defect that was reversible. Breast attenuation was present however ischemia cannot be excluded. This test was low to intermediate risk. Anginal class 1-2.  The risks, benefits, and details of the procedure were explained to the patient.  The patient verbalized understanding and wanted to proceed.  Informed written consent was obtained.  PROCEDURE TECHNIQUE:  After Xylocaine anesthesia and visualization of the femoral head via fluoroscopy, a 17F sheath was placed in the right femoral artery with a single anterior needle wall stick.   Left coronary angiography was done using a Judkins L4 catheter.  Right coronary angiography was done using a Judkins R4 catheter.  Left ventriculography was done using a pigtail catheter.    CONTRAST:  Total of 55 ml.  FLOUROSCOPY TIME: 1.4 minutes.   COMPLICATIONS:  None.    HEMODYNAMICS:  Aortic pressure was 128/34mmHg; LV systolic pressure was ; LVEDP .  There was no gradient between the left ventricle and aorta.    ANGIOGRAPHIC DATA:    Left main: No angiographically significant disease, branches into LAD and circumflex  Left anterior descending (LAD): No angiographically significant disease, distal vessel is small in caliber one significant branching diagonal. LAD wraps around the apex.  Circumflex artery (CIRC): Large-caliber vessel with 2 distal obtuse marginal branches. One high obtuse marginal branch, small in caliber.  Right coronary artery (RCA): Dominant vessel giving rise to the posterior descending artery. The distal vasculature is small in caliber, PDA is small in caliber. There is minor calcification at the ostium  of the RCA. The main proximal mid and distal RCA is widely patent with no irregularities.  LEFT VENTRICULOGRAM:  Left ventricular angiogram was done in the 30 RAO projection and revealed normal left ventricular wall motion and systolic function with an estimated ejection fraction of 55%.   IMPRESSIONS:  1. No angiographically significant coronary artery disease. Distal vasculature is small in caliber, likely as a result of diabetes. 2. Normal left ventricular systolic function.  LVEDP 9 mmHg.  Ejection fraction 55%.  RECOMMENDATION:  Reassuring heart catheterization. Continue aggressive risk factor modification, diabetes, blood pressure, cholesterol control. It is certainly plausible that her chest discomfort was musculoskeletal in etiology. Nuclear stress test perfusion defect likely in large part from breast attenuation artifact.

## 2011-12-19 NOTE — H&P (View-Only) (Signed)
Patient: Sexton Sexton Provider: Maryl Blalock, MD  DOB: 06/15/1950 Age: 61 Y Sex: Female Date: 12/18/2011  Phone: [phone]   Address: [address], , [place]  Pcp: ELIZABETH BARNES       Subjective:     CC:    1. MS/stress test f/u.        HPI:  General:  61-year-old here for evaluation of chest pain with medical history of diabetes, hypertension, hyperlipidemia. She's had Sexton few episodes of substernal chest discomfort with associated shortness of breath. She had some episodes while walking uphill, on vacation. She had pressure while mowing the lawn Sexton few years ago 2003 and because of the stopped mowing. Pressure like running into Sexton wall. She was in [place]. Sexton week after getting back felt mild discomfort when carry groceries. Hemoglobin A1c 7.4. EKG on 11/15/11 shows sinus rhythm rate 60 with no other abnormalities. LDL cholesterol 94.  Underwent stress test which demonstrated low to intermediate risk with distal inferolateral wall ischemia, normal EF. We discussed cardiac catheterization today. She was quite emotional..        ROS:  No bleeding, no syncope. Positive muscle twitches. All others negative.       Medical History: Diabetes, Hyperlipidemia, Hypertension, Fibromyalgia, History of Vitamin D deficiency, Stein Leventhal syndrome.        Surgical History: right shoulder surgery 10/01/10 .        Family History: Father: CHF  Adopted.       Social History:  General:  History of smoking  cigarettes: Never smoked no Smoking, never.  no Alcohol.  no Recreational drug use.  Occupation: Music, worship, enjoys knitting.        Medications: Vitamin D 2000 IU Tablet 1 tablet Once Sexton day, Accu-Chek Aviva Not Specified Strip TEST AS DIRECTED DAILY , Flexeril 10 MG Tablet 1/4 to 1 pill up to three times per day, GlipiZIDE-Metformin HCl 2.5-500 MG Tablet 1/2 tab twice Sexton day, Hydrochlorothiazide 25 MG Tablet 1/2 tablet Once Sexton day, Nadolol 40 MG Not  Specified Tablet TAKE ONE TABLET BY MOUTH ONE TIME DAILY , Lovastatin 40 MG Tablet 1 1/2 tablet with Sexton meal qd, Lortab 7.5 7.5-500 MG Tablet 1 tablet as needed for pain every 6 hrs prn, Tylenol/Codeine #3 300-30 MG Tablet 1 tablet bid prn pain, Medication List reviewed and reconciled with the patient       Allergies: steroids, aspirin, Sulfa drugs (for allergy), Erythromycin: Hives, Naprosyn: nose bleeds and stomach upset, Augmentin: headaches and nausea, All Nsaids, IV DYE: hives, difficulty breathing.       Objective:     Vitals: Wt Refused, Pulse sitting 80, BP sitting 144/90.       Examination:  General Examination:  GENERAL APPEARANCE alert, oriented, NAD, pleasant, anxious.  SKIN: normal, no rash.  HEENT: normal.  HEAD: /AT.  EYES: EOMI, Conjunctiva clear.  NECK: supple, FROM, without evidence of thyromegaly, adenopathy, or bruits, no jugular venous distention (JVD).  LUNGS: clear to auscultation bilaterally, no wheezes, rhonchi, rales, regular breathing rate and effort.  HEART: regular rate and rhythm, no S3, S4, murmur or rub, point of maximul impulse (PMI) normal.  ABDOMEN: soft, non-tender/non-distended, bowel sounds present, no masses palpated, no bruit.  EXTREMITIES: no clubbing, no edema, pulses 2 plus bilaterally.  NEUROLOGIC EXAM: non-focal exam, alert and oriented x 3.  PERIPHERAL PULSES: normal (2+) bilaterally.  LYMPH NODES: no cervical adenopathy.  PSYCH affect normal.  PE done at last visit. Radial pulse 2+, Prior   EKG, lab work, medical records reviewed.       Assessment:     Assessment:  1. Other nonspecific abnormal cardiovascular system function study - 794.39 (Primary)  2. Chest heaviness - 786.59  3. Hypercholesterolemia, Mixed - 272.2  4. Essential hypertension, benign - 401.1  5. 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 Other nonspecific abnormal cardiovascular system function study  LAB: Basic Metabolic    GLUCOSE 224  70-99 - mg/dL  H   BUN 16  6-26 - mg/dL    CREATININE 0.77  0.60-1.30 - mg/dl    eGFR (NON-AFRICAN AMERICAN) 76 >60 - calc    eGFR (AFRICAN AMERICAN) 92 >60 - calc    SODIUM 139  136-145 - mmol/L    POTASSIUM 3.9  3.5-5.5 - mmol/L    CHLORIDE 103  98-107 - mmol/L    C02 30  22-32 - mmol/L    ANION GAP 10.0 6.0-20.0 - mmol/L    CALCIUM 9.4  8.6-10.3 - mg/dL     Sexton Sexton 12/18/2011 12:38:56 PM > ok for cath    LAB: CBC with Diff    WBC 7.8 4.0-11.0 - K/ul    RBC 4.65 4.20-5.40 - M/uL    HGB 14.3 12.0-16.0 - g/dL    HCT 40.7 37.0-47.0 - %    MCH 30.8 27.0-33.0 - pg    MPV 6.8 7.5-10.7 - fL L   MCV 87.4 81.0-99.0 - fL    MCHC 35.2 32.0-36.0 - g/dL    RDW 13.2 11.5-15.5 - %    NRBC# 0.00 -    PLT 209 150-400 - K/uL    NEUT % 53.4 43.3-71.9 - %    NRBC% 0.00 - %    LYMPH% 35.6 16.8-43.5 - %    MONO % 8.0 4.6-12.4 - %    EOS % 2.0 0.0-7.8 - %    BASO % 1.0 0.0-1.0 - %    NEUT # 4.1 1.9-7.2 - K/uL    LYMPH# 2.80 1.10-2.70 - K/uL H   MONO # 0.6 0.3-0.8 - K/uL    EOS # 0.2 0.0-0.6 - K/uL    BASO # 0.1 0.0-0.1 - K/uL     Sexton Sexton 12/18/2011 12:38:56 PM > ok for cath    LAB: PT (Prothrombin Time) (005199)    Prothrombin Time 9.5 9.1-12.0 - SEC    INR 0.9 0.8-1.2 -     Sexton Sexton 12/18/2011 12:38:56 PM > ok for cath    Low to intermediate risk study with inferolateral distal ischemia. Anginal class II. Diabetic. We will proceed with catheterization. Risks and benefits of cardiac catheterization have been reviewed including risk of stroke, heart attack, death, bleeding, renal impariment and arterial damage. There was ample oppurtuny to answer questions. Alternatives were discussed. Patient understands and wishes to proceed.  Radial access. Will give Solumedrol, in 1980's had hives with CT scan.        Procedures:  Venipuncture:  Venipuncture: performed in right arm, performed in right arm, Howell,Kathleen 12/18/2011 10:45:22 AM > .        Immunizations:        Labs:         Procedure Codes: 80048 ECL BMP, 85025 ECL CBC PLATELET DIFF, 36415 BLOOD COLLECTION ROUTINE VENIPUNCTURE       Preventive:         Follow Up: post cath      Provider: Mialynn Shelvin, MD  Patient: Sexton Sexton Sexton DOB: 07/16/1950 Date: 12/18/2011    

## 2013-09-09 ENCOUNTER — Encounter: Payer: Self-pay | Admitting: *Deleted

## 2014-01-13 ENCOUNTER — Encounter (HOSPITAL_COMMUNITY): Payer: Self-pay | Admitting: Cardiology

## 2014-03-07 ENCOUNTER — Other Ambulatory Visit: Payer: Self-pay | Admitting: Family Medicine

## 2014-03-07 ENCOUNTER — Ambulatory Visit
Admission: RE | Admit: 2014-03-07 | Discharge: 2014-03-07 | Disposition: A | Discharge status: Home / Self Care | Payer: BLUE CROSS/BLUE SHIELD | Source: Ambulatory Visit | Attending: Family Medicine | Admitting: Family Medicine

## 2014-03-07 DIAGNOSIS — R1032 Left lower quadrant pain: Secondary | ICD-10-CM

## 2014-04-07 DIAGNOSIS — M542 Cervicalgia: Secondary | ICD-10-CM | POA: Diagnosis not present

## 2014-04-11 DIAGNOSIS — M542 Cervicalgia: Secondary | ICD-10-CM | POA: Diagnosis not present

## 2014-04-13 DIAGNOSIS — M542 Cervicalgia: Secondary | ICD-10-CM | POA: Diagnosis not present

## 2014-04-18 DIAGNOSIS — M542 Cervicalgia: Secondary | ICD-10-CM | POA: Diagnosis not present

## 2014-04-20 DIAGNOSIS — M542 Cervicalgia: Secondary | ICD-10-CM | POA: Diagnosis not present

## 2014-04-22 DIAGNOSIS — M542 Cervicalgia: Secondary | ICD-10-CM | POA: Diagnosis not present

## 2014-04-25 DIAGNOSIS — M542 Cervicalgia: Secondary | ICD-10-CM | POA: Diagnosis not present

## 2014-05-02 DIAGNOSIS — M542 Cervicalgia: Secondary | ICD-10-CM | POA: Diagnosis not present

## 2014-05-09 DIAGNOSIS — M542 Cervicalgia: Secondary | ICD-10-CM | POA: Diagnosis not present

## 2014-05-11 DIAGNOSIS — M542 Cervicalgia: Secondary | ICD-10-CM | POA: Diagnosis not present

## 2014-05-16 DIAGNOSIS — M542 Cervicalgia: Secondary | ICD-10-CM | POA: Diagnosis not present

## 2014-05-19 DIAGNOSIS — M542 Cervicalgia: Secondary | ICD-10-CM | POA: Diagnosis not present

## 2014-06-07 DIAGNOSIS — M542 Cervicalgia: Secondary | ICD-10-CM | POA: Diagnosis not present

## 2014-06-07 DIAGNOSIS — M4712 Other spondylosis with myelopathy, cervical region: Secondary | ICD-10-CM | POA: Diagnosis not present

## 2014-06-18 DIAGNOSIS — M542 Cervicalgia: Secondary | ICD-10-CM | POA: Diagnosis not present

## 2014-06-21 DIAGNOSIS — M542 Cervicalgia: Secondary | ICD-10-CM | POA: Diagnosis not present

## 2014-06-22 DIAGNOSIS — M47812 Spondylosis without myelopathy or radiculopathy, cervical region: Secondary | ICD-10-CM | POA: Diagnosis not present

## 2014-07-06 DIAGNOSIS — E119 Type 2 diabetes mellitus without complications: Secondary | ICD-10-CM | POA: Diagnosis not present

## 2014-07-06 DIAGNOSIS — R1030 Lower abdominal pain, unspecified: Secondary | ICD-10-CM | POA: Diagnosis not present

## 2014-07-06 DIAGNOSIS — E780 Pure hypercholesterolemia: Secondary | ICD-10-CM | POA: Diagnosis not present

## 2014-07-06 DIAGNOSIS — G43909 Migraine, unspecified, not intractable, without status migrainosus: Secondary | ICD-10-CM | POA: Diagnosis not present

## 2014-07-06 DIAGNOSIS — E559 Vitamin D deficiency, unspecified: Secondary | ICD-10-CM | POA: Diagnosis not present

## 2014-07-06 DIAGNOSIS — I10 Essential (primary) hypertension: Secondary | ICD-10-CM | POA: Diagnosis not present

## 2014-07-13 DIAGNOSIS — M47812 Spondylosis without myelopathy or radiculopathy, cervical region: Secondary | ICD-10-CM | POA: Diagnosis not present

## 2014-08-15 DIAGNOSIS — M47812 Spondylosis without myelopathy or radiculopathy, cervical region: Secondary | ICD-10-CM | POA: Diagnosis not present

## 2014-09-03 DIAGNOSIS — M25561 Pain in right knee: Secondary | ICD-10-CM | POA: Diagnosis not present

## 2014-09-05 DIAGNOSIS — M25561 Pain in right knee: Secondary | ICD-10-CM | POA: Diagnosis not present

## 2014-09-13 ENCOUNTER — Ambulatory Visit
Admission: RE | Admit: 2014-09-13 | Discharge: 2014-09-13 | Disposition: A | Discharge status: Home / Self Care | Payer: Medicare Other | Source: Ambulatory Visit | Attending: Orthopaedic Surgery | Admitting: Orthopaedic Surgery

## 2014-09-13 ENCOUNTER — Other Ambulatory Visit: Payer: Self-pay | Admitting: Orthopaedic Surgery

## 2014-09-13 DIAGNOSIS — M25561 Pain in right knee: Secondary | ICD-10-CM

## 2014-09-19 DIAGNOSIS — K5792 Diverticulitis of intestine, part unspecified, without perforation or abscess without bleeding: Secondary | ICD-10-CM | POA: Diagnosis not present

## 2014-09-19 DIAGNOSIS — R509 Fever, unspecified: Secondary | ICD-10-CM | POA: Diagnosis not present

## 2014-09-19 DIAGNOSIS — J329 Chronic sinusitis, unspecified: Secondary | ICD-10-CM | POA: Diagnosis not present

## 2014-09-20 DIAGNOSIS — M25561 Pain in right knee: Secondary | ICD-10-CM | POA: Diagnosis not present

## 2014-09-28 DIAGNOSIS — M25561 Pain in right knee: Secondary | ICD-10-CM | POA: Diagnosis not present

## 2014-09-28 DIAGNOSIS — M25571 Pain in right ankle and joints of right foot: Secondary | ICD-10-CM | POA: Diagnosis not present

## 2014-10-03 DIAGNOSIS — M25561 Pain in right knee: Secondary | ICD-10-CM | POA: Diagnosis not present

## 2014-10-03 DIAGNOSIS — M25571 Pain in right ankle and joints of right foot: Secondary | ICD-10-CM | POA: Diagnosis not present

## 2014-10-06 DIAGNOSIS — M25561 Pain in right knee: Secondary | ICD-10-CM | POA: Diagnosis not present

## 2014-10-06 DIAGNOSIS — M25571 Pain in right ankle and joints of right foot: Secondary | ICD-10-CM | POA: Diagnosis not present

## 2014-10-11 DIAGNOSIS — M25571 Pain in right ankle and joints of right foot: Secondary | ICD-10-CM | POA: Diagnosis not present

## 2014-10-11 DIAGNOSIS — M25561 Pain in right knee: Secondary | ICD-10-CM | POA: Diagnosis not present

## 2014-10-13 DIAGNOSIS — M25571 Pain in right ankle and joints of right foot: Secondary | ICD-10-CM | POA: Diagnosis not present

## 2014-10-13 DIAGNOSIS — M25561 Pain in right knee: Secondary | ICD-10-CM | POA: Diagnosis not present

## 2014-10-18 DIAGNOSIS — M25561 Pain in right knee: Secondary | ICD-10-CM | POA: Diagnosis not present

## 2014-10-18 DIAGNOSIS — M25571 Pain in right ankle and joints of right foot: Secondary | ICD-10-CM | POA: Diagnosis not present

## 2014-10-19 DIAGNOSIS — M25561 Pain in right knee: Secondary | ICD-10-CM | POA: Diagnosis not present

## 2014-10-19 DIAGNOSIS — M25571 Pain in right ankle and joints of right foot: Secondary | ICD-10-CM | POA: Diagnosis not present

## 2014-10-20 DIAGNOSIS — M25571 Pain in right ankle and joints of right foot: Secondary | ICD-10-CM | POA: Diagnosis not present

## 2014-10-20 DIAGNOSIS — M25561 Pain in right knee: Secondary | ICD-10-CM | POA: Diagnosis not present

## 2014-10-25 DIAGNOSIS — M25571 Pain in right ankle and joints of right foot: Secondary | ICD-10-CM | POA: Diagnosis not present

## 2014-10-25 DIAGNOSIS — M25561 Pain in right knee: Secondary | ICD-10-CM | POA: Diagnosis not present

## 2014-10-27 DIAGNOSIS — M25571 Pain in right ankle and joints of right foot: Secondary | ICD-10-CM | POA: Diagnosis not present

## 2014-10-27 DIAGNOSIS — M25561 Pain in right knee: Secondary | ICD-10-CM | POA: Diagnosis not present

## 2014-11-03 DIAGNOSIS — R509 Fever, unspecified: Secondary | ICD-10-CM | POA: Diagnosis not present

## 2014-11-03 DIAGNOSIS — E780 Pure hypercholesterolemia: Secondary | ICD-10-CM | POA: Diagnosis not present

## 2014-11-03 DIAGNOSIS — E119 Type 2 diabetes mellitus without complications: Secondary | ICD-10-CM | POA: Diagnosis not present

## 2014-12-27 DIAGNOSIS — W1809XA Striking against other object with subsequent fall, initial encounter: Secondary | ICD-10-CM | POA: Diagnosis not present

## 2014-12-27 DIAGNOSIS — S60211A Contusion of right wrist, initial encounter: Secondary | ICD-10-CM | POA: Diagnosis not present

## 2015-01-17 DIAGNOSIS — M79661 Pain in right lower leg: Secondary | ICD-10-CM | POA: Diagnosis not present

## 2015-01-18 ENCOUNTER — Ambulatory Visit (HOSPITAL_COMMUNITY)
Admission: RE | Admit: 2015-01-18 | Discharge: 2015-01-18 | Disposition: A | Discharge status: Home / Self Care | Payer: Medicare Other | Source: Ambulatory Visit | Attending: Cardiology | Admitting: Cardiology

## 2015-01-18 ENCOUNTER — Other Ambulatory Visit (HOSPITAL_COMMUNITY): Payer: Self-pay | Admitting: Orthopedic Surgery

## 2015-01-18 DIAGNOSIS — E119 Type 2 diabetes mellitus without complications: Secondary | ICD-10-CM | POA: Diagnosis not present

## 2015-01-18 DIAGNOSIS — M7989 Other specified soft tissue disorders: Secondary | ICD-10-CM

## 2015-01-18 DIAGNOSIS — M79661 Pain in right lower leg: Secondary | ICD-10-CM

## 2015-01-18 DIAGNOSIS — E7800 Pure hypercholesterolemia, unspecified: Secondary | ICD-10-CM | POA: Insufficient documentation

## 2015-01-18 DIAGNOSIS — I10 Essential (primary) hypertension: Secondary | ICD-10-CM | POA: Insufficient documentation

## 2015-01-23 DIAGNOSIS — M25561 Pain in right knee: Secondary | ICD-10-CM | POA: Diagnosis not present

## 2015-02-05 HISTORY — PX: KNEE SURGERY: SHX244

## 2015-02-05 HISTORY — PX: KNEE ARTHROSCOPY: SUR90

## 2015-02-20 DIAGNOSIS — E559 Vitamin D deficiency, unspecified: Secondary | ICD-10-CM | POA: Diagnosis not present

## 2015-02-20 DIAGNOSIS — E11319 Type 2 diabetes mellitus with unspecified diabetic retinopathy without macular edema: Secondary | ICD-10-CM | POA: Diagnosis not present

## 2015-02-20 DIAGNOSIS — Z1389 Encounter for screening for other disorder: Secondary | ICD-10-CM | POA: Diagnosis not present

## 2015-02-20 DIAGNOSIS — E7800 Pure hypercholesterolemia, unspecified: Secondary | ICD-10-CM | POA: Diagnosis not present

## 2015-02-20 DIAGNOSIS — I10 Essential (primary) hypertension: Secondary | ICD-10-CM | POA: Diagnosis not present

## 2015-02-20 DIAGNOSIS — E119 Type 2 diabetes mellitus without complications: Secondary | ICD-10-CM | POA: Diagnosis not present

## 2015-03-03 DIAGNOSIS — H52223 Regular astigmatism, bilateral: Secondary | ICD-10-CM | POA: Diagnosis not present

## 2015-03-03 DIAGNOSIS — E119 Type 2 diabetes mellitus without complications: Secondary | ICD-10-CM | POA: Diagnosis not present

## 2015-03-03 DIAGNOSIS — H2513 Age-related nuclear cataract, bilateral: Secondary | ICD-10-CM | POA: Diagnosis not present

## 2015-03-03 DIAGNOSIS — H5213 Myopia, bilateral: Secondary | ICD-10-CM | POA: Diagnosis not present

## 2015-03-03 DIAGNOSIS — H524 Presbyopia: Secondary | ICD-10-CM | POA: Diagnosis not present

## 2015-03-06 DIAGNOSIS — S83206A Unspecified tear of unspecified meniscus, current injury, right knee, initial encounter: Secondary | ICD-10-CM | POA: Diagnosis not present

## 2015-03-13 DIAGNOSIS — M25561 Pain in right knee: Secondary | ICD-10-CM | POA: Diagnosis not present

## 2015-03-15 DIAGNOSIS — S83206D Unspecified tear of unspecified meniscus, current injury, right knee, subsequent encounter: Secondary | ICD-10-CM | POA: Diagnosis not present

## 2015-03-23 DIAGNOSIS — M23321 Other meniscus derangements, posterior horn of medial meniscus, right knee: Secondary | ICD-10-CM | POA: Diagnosis not present

## 2015-03-23 DIAGNOSIS — G8918 Other acute postprocedural pain: Secondary | ICD-10-CM | POA: Diagnosis not present

## 2015-03-23 DIAGNOSIS — M94261 Chondromalacia, right knee: Secondary | ICD-10-CM | POA: Diagnosis not present

## 2015-03-23 DIAGNOSIS — M23221 Derangement of posterior horn of medial meniscus due to old tear or injury, right knee: Secondary | ICD-10-CM | POA: Diagnosis not present

## 2015-04-05 DIAGNOSIS — M25561 Pain in right knee: Secondary | ICD-10-CM | POA: Diagnosis not present

## 2015-04-05 DIAGNOSIS — S83206D Unspecified tear of unspecified meniscus, current injury, right knee, subsequent encounter: Secondary | ICD-10-CM | POA: Diagnosis not present

## 2015-04-11 DIAGNOSIS — M25561 Pain in right knee: Secondary | ICD-10-CM | POA: Diagnosis not present

## 2015-04-12 DIAGNOSIS — M25561 Pain in right knee: Secondary | ICD-10-CM | POA: Diagnosis not present

## 2015-04-17 DIAGNOSIS — M25561 Pain in right knee: Secondary | ICD-10-CM | POA: Diagnosis not present

## 2015-04-19 DIAGNOSIS — M25561 Pain in right knee: Secondary | ICD-10-CM | POA: Diagnosis not present

## 2015-04-24 DIAGNOSIS — M25561 Pain in right knee: Secondary | ICD-10-CM | POA: Diagnosis not present

## 2015-04-26 DIAGNOSIS — M25561 Pain in right knee: Secondary | ICD-10-CM | POA: Diagnosis not present

## 2015-05-01 DIAGNOSIS — M25561 Pain in right knee: Secondary | ICD-10-CM | POA: Diagnosis not present

## 2015-05-03 DIAGNOSIS — M25561 Pain in right knee: Secondary | ICD-10-CM | POA: Diagnosis not present

## 2015-05-22 DIAGNOSIS — M25561 Pain in right knee: Secondary | ICD-10-CM | POA: Diagnosis not present

## 2015-05-29 DIAGNOSIS — M47812 Spondylosis without myelopathy or radiculopathy, cervical region: Secondary | ICD-10-CM | POA: Diagnosis not present

## 2015-05-29 DIAGNOSIS — M791 Myalgia: Secondary | ICD-10-CM | POA: Diagnosis not present

## 2015-06-08 DIAGNOSIS — E119 Type 2 diabetes mellitus without complications: Secondary | ICD-10-CM | POA: Diagnosis not present

## 2015-06-08 DIAGNOSIS — E7800 Pure hypercholesterolemia, unspecified: Secondary | ICD-10-CM | POA: Diagnosis not present

## 2015-06-08 DIAGNOSIS — Z7984 Long term (current) use of oral hypoglycemic drugs: Secondary | ICD-10-CM | POA: Diagnosis not present

## 2015-06-21 DIAGNOSIS — M47812 Spondylosis without myelopathy or radiculopathy, cervical region: Secondary | ICD-10-CM | POA: Diagnosis not present

## 2015-07-07 ENCOUNTER — Encounter (HOSPITAL_COMMUNITY): Payer: Self-pay | Admitting: *Deleted

## 2015-07-07 ENCOUNTER — Emergency Department (HOSPITAL_COMMUNITY)
Admission: EM | Admit: 2015-07-07 | Discharge: 2015-07-07 | Disposition: A | Discharge status: Home / Self Care | Payer: Medicare Other | Attending: Emergency Medicine | Admitting: Emergency Medicine

## 2015-07-07 ENCOUNTER — Emergency Department (HOSPITAL_COMMUNITY): Payer: Medicare Other

## 2015-07-07 DIAGNOSIS — N132 Hydronephrosis with renal and ureteral calculous obstruction: Secondary | ICD-10-CM | POA: Diagnosis not present

## 2015-07-07 DIAGNOSIS — Z7984 Long term (current) use of oral hypoglycemic drugs: Secondary | ICD-10-CM | POA: Insufficient documentation

## 2015-07-07 DIAGNOSIS — R112 Nausea with vomiting, unspecified: Secondary | ICD-10-CM | POA: Diagnosis not present

## 2015-07-07 DIAGNOSIS — E119 Type 2 diabetes mellitus without complications: Secondary | ICD-10-CM | POA: Insufficient documentation

## 2015-07-07 DIAGNOSIS — N1330 Unspecified hydronephrosis: Secondary | ICD-10-CM | POA: Diagnosis not present

## 2015-07-07 DIAGNOSIS — I10 Essential (primary) hypertension: Secondary | ICD-10-CM | POA: Insufficient documentation

## 2015-07-07 DIAGNOSIS — N200 Calculus of kidney: Secondary | ICD-10-CM | POA: Insufficient documentation

## 2015-07-07 DIAGNOSIS — E7800 Pure hypercholesterolemia, unspecified: Secondary | ICD-10-CM | POA: Insufficient documentation

## 2015-07-07 DIAGNOSIS — R1032 Left lower quadrant pain: Secondary | ICD-10-CM | POA: Diagnosis not present

## 2015-07-07 HISTORY — DX: Irritable bowel syndrome, unspecified: K58.9

## 2015-07-07 HISTORY — DX: Diverticulitis of intestine, part unspecified, without perforation or abscess without bleeding: K57.92

## 2015-07-07 LAB — URINALYSIS, ROUTINE W REFLEX MICROSCOPIC
GLUCOSE, UA: 250 mg/dL — AB
KETONES UR: 40 mg/dL — AB
Nitrite: POSITIVE — AB
PH: 5 (ref 5.0–8.0)
Protein, ur: 100 mg/dL — AB
Specific Gravity, Urine: 1.024 (ref 1.005–1.030)

## 2015-07-07 LAB — URINE MICROSCOPIC-ADD ON

## 2015-07-07 LAB — COMPREHENSIVE METABOLIC PANEL
ALBUMIN: 4.1 g/dL (ref 3.5–5.0)
ALT: 17 U/L (ref 14–54)
AST: 18 U/L (ref 15–41)
Alkaline Phosphatase: 75 U/L (ref 38–126)
Anion gap: 9 (ref 5–15)
BILIRUBIN TOTAL: 0.8 mg/dL (ref 0.3–1.2)
BUN: 23 mg/dL — AB (ref 6–20)
CHLORIDE: 105 mmol/L (ref 101–111)
CO2: 25 mmol/L (ref 22–32)
Calcium: 9 mg/dL (ref 8.9–10.3)
Creatinine, Ser: 1.01 mg/dL — ABNORMAL HIGH (ref 0.44–1.00)
GFR calc Af Amer: >60 mL/min (ref >60)
GFR calc non Af Amer: 57 mL/min — ABNORMAL LOW (ref >60)
GLUCOSE: 189 mg/dL — AB (ref 65–99)
POTASSIUM: 3.6 mmol/L (ref 3.5–5.1)
Sodium: 139 mmol/L (ref 135–145)
TOTAL PROTEIN: 7.7 g/dL (ref 6.5–8.1)

## 2015-07-07 LAB — CBC
HEMATOCRIT: 39.7 % (ref 36.0–46.0)
Hemoglobin: 13.4 g/dL (ref 12.0–15.0)
MCH: 30 pg (ref 26.0–34.0)
MCHC: 33.8 g/dL (ref 30.0–36.0)
MCV: 88.8 fL (ref 78.0–100.0)
Platelets: 246 10*3/uL (ref 150–400)
RBC: 4.47 MIL/uL (ref 3.87–5.11)
RDW: 13.2 % (ref 11.5–15.5)
WBC: 11.8 10*3/uL — ABNORMAL HIGH (ref 4.0–10.5)

## 2015-07-07 LAB — LIPASE, BLOOD: Lipase: 44 U/L (ref 11–51)

## 2015-07-07 MED ORDER — TAMSULOSIN HCL 0.4 MG PO CAPS: 0.4000 mg | ORAL_CAPSULE | Freq: Every day | ORAL | Status: DC

## 2015-07-07 MED ORDER — ONDANSETRON HCL 4 MG/2ML IJ SOLN
4.0000 mg | Freq: Once | INTRAMUSCULAR | Status: AC
  Administered 2015-07-07: 4 mg via INTRAVENOUS
  Filled 2015-07-07: qty 2

## 2015-07-07 MED ORDER — ONDANSETRON HCL 4 MG PO TABS: 4.0000 mg | ORAL_TABLET | Freq: Four times a day (QID) | ORAL | Status: DC

## 2015-07-07 MED ORDER — KETOROLAC TROMETHAMINE 15 MG/ML IJ SOLN
15.0000 mg | Freq: Once | INTRAMUSCULAR | Status: AC
  Administered 2015-07-07: 15 mg via INTRAVENOUS
  Filled 2015-07-07: qty 1

## 2015-07-07 MED ORDER — HYDROCODONE-ACETAMINOPHEN 5-325 MG PO TABS: 1.0000 | ORAL_TABLET | ORAL | Status: DC | PRN

## 2015-07-07 MED ORDER — MORPHINE SULFATE (PF) 4 MG/ML IV SOLN
4.0000 mg | Freq: Once | INTRAVENOUS | Status: AC
  Administered 2015-07-07: 4 mg via INTRAVENOUS
  Filled 2015-07-07: qty 1

## 2015-07-07 NOTE — ED Notes (Signed)
Per EMS report: pt coming from home with lower left quadrant pain that began at 08:00 this morning.  Pain does not radiate. Pt had rotisserie chicken last night and her normal breakfast this morning.  Pt vomited twice today.  EMS gave pt 189mcg fentanyl and 4mg  zofran en route.  Hx IBS and diverticulitis. Currently rates pain 4 out of 10.

## 2015-07-07 NOTE — ED Notes (Signed)
Pt aware of urine sample but unable to provide one at this time.

## 2015-07-07 NOTE — ED Notes (Signed)
Bed: WA08 Expected date:  Expected time:  Means of arrival:  Comments: EMS - 60s Abd/Flank pain

## 2015-07-07 NOTE — Discharge Instructions (Signed)
Kidney Stones °Kidney stones (urolithiasis) are deposits that form inside your kidneys. The intense pain is caused by the stone moving through the urinary tract. When the stone moves, the ureter goes into spasm around the stone. The stone is usually passed in the urine.  °CAUSES  °· A disorder that makes certain neck glands produce too much parathyroid hormone (primary hyperparathyroidism). °· A buildup of uric acid crystals, similar to gout in your joints. °· Narrowing (stricture) of the ureter. °· A kidney obstruction present at birth (congenital obstruction). °· Previous surgery on the kidney or ureters. °· Numerous kidney infections. °SYMPTOMS  °· Feeling sick to your stomach (nauseous). °· Throwing up (vomiting). °· Blood in the urine (hematuria). °· Pain that usually spreads (radiates) to the groin. °· Frequency or urgency of urination. °DIAGNOSIS  °· Taking a history and physical exam. °· Blood or urine tests. °· CT scan. °· Occasionally, an examination of the inside of the urinary bladder (cystoscopy) is performed. °TREATMENT  °· Observation. °· Increasing your fluid intake. °· Extracorporeal shock wave lithotripsy--This is a noninvasive procedure that uses shock waves to break up kidney stones. °· Surgery may be needed if you have severe pain or persistent obstruction. There are various surgical procedures. Most of the procedures are performed with the use of small instruments. Only small incisions are needed to accommodate these instruments, so recovery time is minimized. °The size, location, and chemical composition are all important variables that will determine the proper choice of action for you. Talk to your health care provider to better understand your situation so that you will minimize the risk of injury to yourself and your kidney.  °HOME CARE INSTRUCTIONS  °· Drink enough water and fluids to keep your urine clear or pale yellow. This will help you to pass the stone or stone fragments. °· Strain  all urine through the provided strainer. Keep all particulate matter and stones for your health care provider to see. The stone causing the pain may be as small as a grain of salt. It is very important to use the strainer each and every time you pass your urine. The collection of your stone will allow your health care provider to analyze it and verify that a stone has actually passed. The stone analysis will often identify what you can do to reduce the incidence of recurrences. °· Only take over-the-counter or prescription medicines for pain, discomfort, or fever as directed by your health care provider. °· Keep all follow-up visits as told by your health care provider. This is important. °· Get follow-up X-rays if required. The absence of pain does not always mean that the stone has passed. It may have only stopped moving. If the urine remains completely obstructed, it can cause loss of kidney function or even complete destruction of the kidney. It is your responsibility to make sure X-rays and follow-ups are completed. Ultrasounds of the kidney can show blockages and the status of the kidney. Ultrasounds are not associated with any radiation and can be performed easily in a matter of minutes. °· Make changes to your daily diet as told by your health care provider. You may be told to: °¨ Limit the amount of salt that you eat. °¨ Eat 5 or more servings of fruits and vegetables each day. °¨ Limit the amount of meat, poultry, fish, and eggs that you eat. °· Collect a 24-hour urine sample as told by your health care provider. You may need to collect another urine sample every 6-12   months. °SEEK MEDICAL CARE IF: °· You experience pain that is progressive and unresponsive to any pain medicine you have been prescribed. °SEEK IMMEDIATE MEDICAL CARE IF:  °· Pain cannot be controlled with the prescribed medicine. °· You have a fever or shaking chills. °· The severity or intensity of pain increases over 18 hours and is not  relieved by pain medicine. °· You develop a new onset of abdominal pain. °· You feel faint or pass out. °· You are unable to urinate. °  °This information is not intended to replace advice given to you by your health care provider. Make sure you discuss any questions you have with your health care provider. °  °Document Released: 01/21/2005 Document Revised: 10/12/2014 Document Reviewed: 06/24/2012 °Elsevier Interactive Patient Education ©2016 [employer] ° °

## 2015-07-07 NOTE — ED Provider Notes (Signed)
CSN: [id number]     Arrival date & time 07/07/15  1039 History   First MD Initiated Contact with Patient 07/07/15 1102     Chief Complaint  Patient presents with  . Abdominal Pain     (Consider location/radiation/quality/duration/timing/severity/associated sxs/prior Treatment) Patient is a 65 y.o. female presenting with abdominal pain. The history is provided by the patient.  Abdominal Pain Pain location:  L flank and LLQ Pain quality: sharp, shooting and stabbing   Pain radiates to:  Does not radiate Pain severity:  Severe Onset quality:  Sudden Duration:  3 hours Timing:  Constant Progression:  Unchanged Chronicity:  New Context comment:  She woke up this morning her normal self. She went for a walk and came back and ate breakfast. Then approximately 30 minutes after eating she developed sudden onset of left-sided abdominal pain Relieved by:  Nothing Worsened by:  Nothing tried Ineffective treatments:  None tried Associated symptoms: diarrhea, nausea and vomiting   Associated symptoms: no chest pain, no dysuria, no fever, no vaginal bleeding and no vaginal discharge   Risk factors comment:  Prior history of diverticulitis and IBS but states the pain today does not feel like that   Past Medical History  Diagnosis Date  . Hypertension   . Diabetes mellitus   . Hypercholesteremia   . 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 Migraines   . IBS (irritable bowel syndrome)   . Diverticulitis    Past Surgical History  Procedure Laterality Date  . Shoulder surgery    . Cholecystectomy    . Tonsillectomy    . Left heart catheterization with coronary angiogram N/A 12/19/2011    Procedure: LEFT HEART CATHETERIZATION WITH CORONARY ANGIOGRAM;  Surgeon: Candee Furbish, MD;  Location: [facility] CATH LAB;  Service: Cardiovascular;  Laterality: N/A;   Family History  Problem Relation Age of Onset  . Family history unknown: Yes   Social History  Substance Use Topics  . Smoking status: Never Smoker   .  Smokeless tobacco: None  . Alcohol Use: No   OB History    No data available     Review of Systems  Constitutional: Negative for fever.  Cardiovascular: Negative for chest pain.  Gastrointestinal: Positive for nausea, vomiting, abdominal pain and diarrhea.  Genitourinary: Negative for dysuria, vaginal bleeding and vaginal discharge.  All other systems reviewed and are negative.     Allergies  Azithromycin; Contrast media; Augmentin; Prednisone; and Sulfa antibiotics  Home Medications   Prior to Admission medications   Medication Sig Start Date End Date Taking? Authorizing Provider  acetaminophen-codeine (TYLENOL #3) 300-30 MG per tablet Take 1 tablet by mouth every 4 (four) hours as needed. pain    Historical Provider, MD  cholecalciferol (VITAMIN D) 1000 UNITS tablet Take 1,000 Units by mouth daily.    Historical Provider, MD  cyclobenzaprine (FLEXERIL) 10 MG tablet Take 5 mg by mouth 3 (three) times daily as needed. Muscle spasm    Historical Provider, MD  glipiZIDE-metformin (METAGLIP) 2.5-500 MG per tablet Take 0.5 tablets by mouth 2 (two) times daily before a meal.    Historical Provider, MD  hydrochlorothiazide (HYDRODIURIL) 25 MG tablet Take 12.5 mg by mouth daily.    Historical Provider, MD  HYDROcodone-acetaminophen (LORTAB) 7.5-500 MG per tablet Take 1 tablet by mouth every 6 (six) hours as needed. migraines    Historical Provider, MD  lovastatin (MEVACOR) 40 MG tablet Take 60 mg by mouth daily.    Historical Provider, MD  nadolol (CORGARD) 40 MG  tablet Take 40 mg by mouth daily.    Historical Provider, MD   BP 162/61 mmHg  Pulse 85  Temp(Src) 98.4 F (36.9 C) (Oral)  Resp 18  SpO2 100% Physical Exam  Constitutional: She is oriented to person, place, and time. She appears well-developed and well-nourished. No distress.  HENT:  Head: Normocephalic and atraumatic.  Mouth/Throat: Oropharynx is clear and moist.  Eyes: Conjunctivae and EOM are normal. Pupils are  equal, round, and reactive to light.  Neck: Normal range of motion. Neck supple.  Cardiovascular: Normal rate, regular rhythm and intact distal pulses.   No murmur heard. Pulmonary/Chest: Effort normal and breath sounds normal. No respiratory distress. She has no wheezes. She has no rales.  Abdominal: Soft. She exhibits no distension. There is no tenderness. There is CVA tenderness. There is no rebound and no guarding.  Left CVA tenderness  Musculoskeletal: Normal range of motion. She exhibits no edema or tenderness.  Neurological: She is alert and oriented to person, place, and time.  Skin: Skin is warm and dry. No rash noted. No erythema.  Psychiatric: She has a normal mood and affect. Her behavior is normal.  Nursing note and vitals reviewed.   ED Course  Procedures (including critical care time) Labs Review Labs Reviewed  COMPREHENSIVE METABOLIC PANEL - Abnormal; Notable for the following:    Glucose, Bld 189 (*)    BUN 23 (*)    Creatinine, Ser 1.01 (*)    GFR calc non Af Amer 57 (*)    All other components within normal limits  CBC - Abnormal; Notable for the following:    WBC 11.8 (*)    All other components within normal limits  URINALYSIS, ROUTINE W REFLEX MICROSCOPIC (NOT AT [facility]) - Abnormal; Notable for the following:    Color, Urine RED (*)    APPearance TURBID (*)    Glucose, UA 250 (*)    Hgb urine dipstick LARGE (*)    Bilirubin Urine MODERATE (*)    Ketones, ur 40 (*)    Protein, ur 100 (*)    Nitrite POSITIVE (*)    Leukocytes, UA MODERATE (*)    All other components within normal limits  URINE MICROSCOPIC-ADD ON - Abnormal; Notable for the following:    Squamous Epithelial / LPF 0-5 (*)    Bacteria, UA MANY (*)    All other components within normal limits  LIPASE, BLOOD    Imaging Review Ct Abdomen Pelvis Wo Contrast  07/07/2015  CLINICAL DATA:  Left lower quadrant pain. EXAM: CT ABDOMEN AND PELVIS WITHOUT CONTRAST TECHNIQUE: Multidetector CT imaging of  the abdomen and pelvis was performed following the standard protocol without IV contrast. COMPARISON:  March 07, 2014 FINDINGS: Normal lung bases. No free air or free fluid. No stones are seen in either kidney. No perinephric stranding or hydronephrosis on the right. There is mild hydronephrosis associated with the left kidney in addition to mild perinephric stranding. These findings were not seen previously. The mild stranding is also adjacent to the most proximal aspect of the left ureter. There is no discrete stone seen within the left ureter. However, there is subtle high attenuation in the proximal left ureter on axial image 35 and coronal image 60, possibly a tiny punctate stone. The remainder of the left ureter is normal in appearance. The patient is status postcholecystectomy. There is hepatic steatosis. The liver is otherwise normal. The spleen, adrenal glands, and pancreas are normal. Mild atherosclerotic change is seen in the  non aneurysmal aorta. No adenopathy. The stomach and small bowel are normal. Diverticulosis is seen. There is slight stranding adjacent to the sigmoid colon on axial image 70 and coronal image 57. Remainder of the colon is normal in appearance. The appendix is normal as well. No adenopathy or mass in the pelvis. The bladder is poorly distended but unremarkable. The uterus and adnexae are normal. No other abnormalities identified in the pelvis. Degenerative changes are seen in the lower lumbar facets. No other acute bony abnormalities are identified. IMPRESSION: 1. New mild left hydronephrosis and perinephric stranding. While a discrete stone is not seen, there is subtle high attenuation in the proximal left ureter which could represent a tiny punctate stone. The more distal ureter is normal in appearance. Recommend correlation with urinalysis for hematuria. 2. There is slight stranding adjacent to the sigmoid colon, suggesting subtle low-grade diverticulitis. Electronically Signed    By: Dorise Bullion III M.D   On: 07/07/2015 12:48   I have personally reviewed and evaluated these images and lab results as part of my medical decision-making.   EKG Interpretation None      MDM   Final diagnoses:  Kidney stone    Pt with symptoms consistent with kidney stone with abrupt pain starting this morning.  Denies infectious sx, or GI symptoms. States that it is not similar to her IBS or diverticulitis pain. Low concern for diverticulitis and no risk factors or history suggestive of AAA.  No hx suggestive of GU source (discharge).  Pain is not reproducible on exam but she does have some flank tenderness. She does have a history of ovarian cysts but has never had a ruptured cyst but this could be torsion.  UA was very dark and concern for hematuria. CBC, CMP, lipase, UA, CT to rule out stone pending. Patient received fentanyl by EMS with improvement of pain.  1:49 PM Symptoms and skin are consistent with a kidney stone with mild left hydronephrosis and perinephric stranding. Urine with too numerous to count white blood cells. No infectious symptoms at this time. Patient's pain is controlled and will discharge home to follow-up with urology  Blanchie Dessert, MD 07/07/15 1434

## 2015-07-11 DIAGNOSIS — M47812 Spondylosis without myelopathy or radiculopathy, cervical region: Secondary | ICD-10-CM | POA: Diagnosis not present

## 2015-07-17 DIAGNOSIS — M791 Myalgia: Secondary | ICD-10-CM | POA: Diagnosis not present

## 2015-07-17 DIAGNOSIS — Z8639 Personal history of other endocrine, nutritional and metabolic disease: Secondary | ICD-10-CM | POA: Diagnosis not present

## 2015-07-17 DIAGNOSIS — Z7984 Long term (current) use of oral hypoglycemic drugs: Secondary | ICD-10-CM | POA: Diagnosis not present

## 2015-07-17 DIAGNOSIS — R197 Diarrhea, unspecified: Secondary | ICD-10-CM | POA: Diagnosis not present

## 2015-07-17 DIAGNOSIS — R609 Edema, unspecified: Secondary | ICD-10-CM | POA: Diagnosis not present

## 2015-07-17 DIAGNOSIS — E1165 Type 2 diabetes mellitus with hyperglycemia: Secondary | ICD-10-CM | POA: Diagnosis not present

## 2015-07-17 DIAGNOSIS — R109 Unspecified abdominal pain: Secondary | ICD-10-CM | POA: Diagnosis not present

## 2015-07-21 DIAGNOSIS — E11319 Type 2 diabetes mellitus with unspecified diabetic retinopathy without macular edema: Secondary | ICD-10-CM | POA: Diagnosis not present

## 2015-07-21 DIAGNOSIS — E119 Type 2 diabetes mellitus without complications: Secondary | ICD-10-CM | POA: Diagnosis not present

## 2015-07-21 DIAGNOSIS — Z7984 Long term (current) use of oral hypoglycemic drugs: Secondary | ICD-10-CM | POA: Diagnosis not present

## 2015-07-21 DIAGNOSIS — E559 Vitamin D deficiency, unspecified: Secondary | ICD-10-CM | POA: Diagnosis not present

## 2015-07-21 DIAGNOSIS — I10 Essential (primary) hypertension: Secondary | ICD-10-CM | POA: Diagnosis not present

## 2015-07-21 DIAGNOSIS — E7800 Pure hypercholesterolemia, unspecified: Secondary | ICD-10-CM | POA: Diagnosis not present

## 2015-08-18 DIAGNOSIS — M791 Myalgia: Secondary | ICD-10-CM | POA: Diagnosis not present

## 2015-09-08 DIAGNOSIS — M2550 Pain in unspecified joint: Secondary | ICD-10-CM | POA: Diagnosis not present

## 2015-09-08 DIAGNOSIS — E559 Vitamin D deficiency, unspecified: Secondary | ICD-10-CM | POA: Diagnosis not present

## 2015-09-08 DIAGNOSIS — E7800 Pure hypercholesterolemia, unspecified: Secondary | ICD-10-CM | POA: Diagnosis not present

## 2015-09-08 DIAGNOSIS — M791 Myalgia: Secondary | ICD-10-CM | POA: Diagnosis not present

## 2015-09-13 DIAGNOSIS — R609 Edema, unspecified: Secondary | ICD-10-CM | POA: Diagnosis not present

## 2015-09-13 DIAGNOSIS — E1165 Type 2 diabetes mellitus with hyperglycemia: Secondary | ICD-10-CM | POA: Diagnosis not present

## 2015-10-23 DIAGNOSIS — T23231A Burn of second degree of multiple right fingers (nail), not including thumb, initial encounter: Secondary | ICD-10-CM | POA: Diagnosis not present

## 2015-10-27 DIAGNOSIS — T23221S Burn of second degree of single right finger (nail) except thumb, sequela: Secondary | ICD-10-CM | POA: Diagnosis not present

## 2015-11-12 DIAGNOSIS — R200 Anesthesia of skin: Secondary | ICD-10-CM | POA: Diagnosis not present

## 2015-11-12 DIAGNOSIS — J3489 Other specified disorders of nose and nasal sinuses: Secondary | ICD-10-CM | POA: Diagnosis not present

## 2015-11-12 DIAGNOSIS — S022XXA Fracture of nasal bones, initial encounter for closed fracture: Secondary | ICD-10-CM | POA: Diagnosis not present

## 2015-11-16 DIAGNOSIS — S022XXD Fracture of nasal bones, subsequent encounter for fracture with routine healing: Secondary | ICD-10-CM | POA: Diagnosis not present

## 2015-11-16 DIAGNOSIS — S0083XD Contusion of other part of head, subsequent encounter: Secondary | ICD-10-CM | POA: Diagnosis not present

## 2015-11-16 DIAGNOSIS — R21 Rash and other nonspecific skin eruption: Secondary | ICD-10-CM | POA: Diagnosis not present

## 2015-11-30 DIAGNOSIS — S022XXB Fracture of nasal bones, initial encounter for open fracture: Secondary | ICD-10-CM | POA: Diagnosis not present

## 2015-12-05 DIAGNOSIS — M25571 Pain in right ankle and joints of right foot: Secondary | ICD-10-CM | POA: Diagnosis not present

## 2015-12-05 DIAGNOSIS — M25511 Pain in right shoulder: Secondary | ICD-10-CM | POA: Diagnosis not present

## 2015-12-07 DIAGNOSIS — M25511 Pain in right shoulder: Secondary | ICD-10-CM | POA: Diagnosis not present

## 2015-12-07 DIAGNOSIS — M542 Cervicalgia: Secondary | ICD-10-CM | POA: Diagnosis not present

## 2015-12-12 DIAGNOSIS — M542 Cervicalgia: Secondary | ICD-10-CM | POA: Diagnosis not present

## 2015-12-12 DIAGNOSIS — M25511 Pain in right shoulder: Secondary | ICD-10-CM | POA: Diagnosis not present

## 2015-12-14 DIAGNOSIS — M25511 Pain in right shoulder: Secondary | ICD-10-CM | POA: Diagnosis not present

## 2015-12-14 DIAGNOSIS — M542 Cervicalgia: Secondary | ICD-10-CM | POA: Diagnosis not present

## 2015-12-19 DIAGNOSIS — M542 Cervicalgia: Secondary | ICD-10-CM | POA: Diagnosis not present

## 2015-12-19 DIAGNOSIS — M25511 Pain in right shoulder: Secondary | ICD-10-CM | POA: Diagnosis not present

## 2015-12-26 DIAGNOSIS — M542 Cervicalgia: Secondary | ICD-10-CM | POA: Diagnosis not present

## 2015-12-26 DIAGNOSIS — M25511 Pain in right shoulder: Secondary | ICD-10-CM | POA: Diagnosis not present

## 2016-01-02 DIAGNOSIS — M542 Cervicalgia: Secondary | ICD-10-CM | POA: Diagnosis not present

## 2016-01-02 DIAGNOSIS — S022XXB Fracture of nasal bones, initial encounter for open fracture: Secondary | ICD-10-CM | POA: Diagnosis not present

## 2016-01-02 DIAGNOSIS — M25511 Pain in right shoulder: Secondary | ICD-10-CM | POA: Diagnosis not present

## 2016-01-04 DIAGNOSIS — M25511 Pain in right shoulder: Secondary | ICD-10-CM | POA: Diagnosis not present

## 2016-01-04 DIAGNOSIS — M542 Cervicalgia: Secondary | ICD-10-CM | POA: Diagnosis not present

## 2016-01-09 DIAGNOSIS — M25511 Pain in right shoulder: Secondary | ICD-10-CM | POA: Diagnosis not present

## 2016-01-09 DIAGNOSIS — M542 Cervicalgia: Secondary | ICD-10-CM | POA: Diagnosis not present

## 2016-01-11 DIAGNOSIS — M25511 Pain in right shoulder: Secondary | ICD-10-CM | POA: Diagnosis not present

## 2016-01-11 DIAGNOSIS — M79671 Pain in right foot: Secondary | ICD-10-CM | POA: Diagnosis not present

## 2016-01-11 DIAGNOSIS — M542 Cervicalgia: Secondary | ICD-10-CM | POA: Diagnosis not present

## 2016-01-18 DIAGNOSIS — M79671 Pain in right foot: Secondary | ICD-10-CM | POA: Diagnosis not present

## 2016-02-07 DIAGNOSIS — M722 Plantar fascial fibromatosis: Secondary | ICD-10-CM | POA: Diagnosis not present

## 2016-02-12 DIAGNOSIS — M722 Plantar fascial fibromatosis: Secondary | ICD-10-CM | POA: Diagnosis not present

## 2016-02-15 DIAGNOSIS — M722 Plantar fascial fibromatosis: Secondary | ICD-10-CM | POA: Diagnosis not present

## 2016-02-20 DIAGNOSIS — M722 Plantar fascial fibromatosis: Secondary | ICD-10-CM | POA: Diagnosis not present

## 2016-02-27 DIAGNOSIS — M722 Plantar fascial fibromatosis: Secondary | ICD-10-CM | POA: Diagnosis not present

## 2016-02-28 DIAGNOSIS — Z7984 Long term (current) use of oral hypoglycemic drugs: Secondary | ICD-10-CM | POA: Diagnosis not present

## 2016-02-28 DIAGNOSIS — R609 Edema, unspecified: Secondary | ICD-10-CM | POA: Diagnosis not present

## 2016-02-28 DIAGNOSIS — E1165 Type 2 diabetes mellitus with hyperglycemia: Secondary | ICD-10-CM | POA: Diagnosis not present

## 2016-02-29 DIAGNOSIS — M722 Plantar fascial fibromatosis: Secondary | ICD-10-CM | POA: Diagnosis not present

## 2016-03-05 DIAGNOSIS — M722 Plantar fascial fibromatosis: Secondary | ICD-10-CM | POA: Diagnosis not present

## 2016-03-07 DIAGNOSIS — M722 Plantar fascial fibromatosis: Secondary | ICD-10-CM | POA: Diagnosis not present

## 2016-03-12 DIAGNOSIS — M722 Plantar fascial fibromatosis: Secondary | ICD-10-CM | POA: Diagnosis not present

## 2016-03-15 DIAGNOSIS — J019 Acute sinusitis, unspecified: Secondary | ICD-10-CM | POA: Diagnosis not present

## 2016-03-15 DIAGNOSIS — E119 Type 2 diabetes mellitus without complications: Secondary | ICD-10-CM | POA: Diagnosis not present

## 2016-03-15 DIAGNOSIS — Z794 Long term (current) use of insulin: Secondary | ICD-10-CM | POA: Diagnosis not present

## 2016-03-19 DIAGNOSIS — M722 Plantar fascial fibromatosis: Secondary | ICD-10-CM | POA: Diagnosis not present

## 2016-04-02 DIAGNOSIS — E1165 Type 2 diabetes mellitus with hyperglycemia: Secondary | ICD-10-CM | POA: Diagnosis not present

## 2016-04-02 DIAGNOSIS — Z794 Long term (current) use of insulin: Secondary | ICD-10-CM | POA: Diagnosis not present

## 2016-04-12 DIAGNOSIS — H52223 Regular astigmatism, bilateral: Secondary | ICD-10-CM | POA: Diagnosis not present

## 2016-04-12 DIAGNOSIS — E119 Type 2 diabetes mellitus without complications: Secondary | ICD-10-CM | POA: Diagnosis not present

## 2016-04-12 DIAGNOSIS — H5213 Myopia, bilateral: Secondary | ICD-10-CM | POA: Diagnosis not present

## 2016-04-12 DIAGNOSIS — H524 Presbyopia: Secondary | ICD-10-CM | POA: Diagnosis not present

## 2016-04-12 DIAGNOSIS — H2513 Age-related nuclear cataract, bilateral: Secondary | ICD-10-CM | POA: Diagnosis not present

## 2016-06-04 ENCOUNTER — Ambulatory Visit (INDEPENDENT_AMBULATORY_CARE_PROVIDER_SITE_OTHER): Payer: Medicare Other | Admitting: Neurology

## 2016-06-04 ENCOUNTER — Encounter: Payer: Self-pay | Admitting: Neurology

## 2016-06-04 VITALS — BP 122/77 | HR 87 | Ht 65.0 in | Wt 177.0 lb

## 2016-06-04 DIAGNOSIS — G500 Trigeminal neuralgia: Secondary | ICD-10-CM | POA: Diagnosis not present

## 2016-06-04 DIAGNOSIS — S022XXB Fracture of nasal bones, initial encounter for open fracture: Secondary | ICD-10-CM | POA: Diagnosis not present

## 2016-06-04 MED ORDER — BACLOFEN 10 MG PO TABS: 10.0000 mg | ORAL_TABLET | Freq: Three times a day (TID) | ORAL | 11 refills | Status: DC | PRN

## 2016-06-04 MED ORDER — GABAPENTIN 100 MG PO CAPS: 100.0000 mg | ORAL_CAPSULE | Freq: Three times a day (TID) | ORAL | 12 refills | Status: DC

## 2016-06-04 NOTE — Patient Instructions (Signed)
Remember to drink plenty of fluid, eat healthy meals and do not skip any meals. Try to eat protein with a every meal and eat a healthy snack such as fruit or nuts in between meals. Try to keep a regular sleep-wake schedule and try to exercise daily, particularly in the form of walking, 20-30 minutes a day, if you can.   As far as your medications are concerned, I would like to suggest: Gabapentin 100mg  three times a day  As far as diagnostic testing:   I would like to see you back in 6 months, sooner if we need to. Please call us with any interim questions, concerns, problems, updates or refill requests.   Our phone number is [phone]. We also have an after hours call service for urgent matters and there is a physician on-call for urgent questions. For any emergencies you know to call 911 or go to the nearest emergency room  Gabapentin capsules or tablets What is this medicine? GABAPENTIN (GA ba pen tin) is used to control partial seizures in adults with epilepsy. It is also used to treat certain types of nerve pain. This medicine may be used for other purposes; ask your health care provider or pharmacist if you have questions. COMMON BRAND NAME(S): Active-PAC with Gabapentin, Gabarone, Neurontin What should I tell my health care provider before I take this medicine? They need to know if you have any of these conditions: -kidney disease -suicidal thoughts, plans, or attempt; a previous suicide attempt by you or a family member -an unusual or allergic reaction to gabapentin, other medicines, foods, dyes, or preservatives -pregnant or trying to get pregnant -breast-feeding How should I use this medicine? Take this medicine by mouth with a glass of water. Follow the directions on the prescription label. You can take it with or without food. If it upsets your stomach, take it with food.Take your medicine at regular intervals. Do not take it more often than directed. Do not stop taking except  on your doctor's advice. If you are directed to break the 600 or 800 mg tablets in half as part of your dose, the extra half tablet should be used for the next dose. If you have not used the extra half tablet within 28 days, it should be thrown away. A special MedGuide will be given to you by the pharmacist with each prescription and refill. Be sure to read this information carefully each time. Talk to your pediatrician regarding the use of this medicine in children. Special care may be needed. Overdosage: If you think you have taken too much of this medicine contact a poison control center or emergency room at once. NOTE: This medicine is only for you. Do not share this medicine with others. What if I miss a dose? If you miss a dose, take it as soon as you can. If it is almost time for your next dose, take only that dose. Do not take double or extra doses. What may interact with this medicine? Do not take this medicine with any of the following medications: -other gabapentin products This medicine may also interact with the following medications: -alcohol -antacids -antihistamines for allergy, cough and cold -certain medicines for anxiety or sleep -certain medicines for depression or psychotic disturbances -homatropine; hydrocodone -naproxen -narcotic medicines (opiates) for pain -phenothiazines like chlorpromazine, mesoridazine, prochlorperazine, thioridazine This list may not describe all possible interactions. Give your health care provider a list of all the medicines, herbs, non-prescription drugs, or dietary supplements you use. Also  tell them if you smoke, drink alcohol, or use illegal drugs. Some items may interact with your medicine. What should I watch for while using this medicine? Visit your doctor or health care professional for regular checks on your progress. You may want to keep a record at home of how you feel your condition is responding to treatment. You may want to share  this information with your doctor or health care professional at each visit. You should contact your doctor or health care professional if your seizures get worse or if you have any new types of seizures. Do not stop taking this medicine or any of your seizure medicines unless instructed by your doctor or health care professional. Stopping your medicine suddenly can increase your seizures or their severity. Wear a medical identification bracelet or chain if you are taking this medicine for seizures, and carry a card that lists all your medications. You may get drowsy, dizzy, or have blurred vision. Do not drive, use machinery, or do anything that needs mental alertness until you know how this medicine affects you. To reduce dizzy or fainting spells, do not sit or stand up quickly, especially if you are an older patient. Alcohol can increase drowsiness and dizziness. Avoid alcoholic drinks. Your mouth may get dry. Chewing sugarless gum or sucking hard candy, and drinking plenty of water will help. The use of this medicine may increase the chance of suicidal thoughts or actions. Pay special attention to how you are responding while on this medicine. Any worsening of mood, or thoughts of suicide or dying should be reported to your health care professional right away. Women who become pregnant while using this medicine may enroll in the [place] Pregnancy Registry by calling [phone]. This registry collects information about the safety of antiepileptic drug use during pregnancy. What side effects may I notice from receiving this medicine? Side effects that you should report to your doctor or health care professional as soon as possible: -allergic reactions like skin rash, itching or hives, swelling of the face, lips, or tongue -worsening of mood, thoughts or actions of suicide or dying Side effects that usually do not require medical attention (report to your doctor or health  care professional if they continue or are bothersome): -constipation -difficulty walking or controlling muscle movements -dizziness -nausea -slurred speech -tiredness -tremors -weight gain This list may not describe all possible side effects. Call your doctor for medical advice about side effects. You may report side effects to FDA at 1-800-FDA-1088. Where should I keep my medicine? Keep out of reach of children. This medicine may cause accidental overdose and death if it taken by other adults, children, or pets. Mix any unused medicine with a substance like cat litter or coffee grounds. Then throw the medicine away in a sealed container like a sealed bag or a coffee can with a lid. Do not use the medicine after the expiration date. Store at room temperature between 15 and 30 degrees C (59 and 86 degrees F). NOTE: This sheet is a summary. It may not cover all possible information. If you have questions about this medicine, talk to your doctor, pharmacist, or health care provider.  2018 Elsevier/Gold Standard (2013-03-19 15:26:50)

## 2016-06-04 NOTE — Progress Notes (Signed)
[facility]    Provider:  Dr Jaynee Eagles Referring Provider: Dr. Ernesto Rutherford Primary Care Physician:  Gerrit Heck, MD  CC:  Trigeminal neuralgia  HPI:  [name] is a 66 y.o. female here as a referral from Dr. Ernesto Rutherford for facial pain. She has a past medical history of fracture of the nasal bones, diabetes, hyperlipidemia, hypertension, fibromyalgia, migraine headaches, hypertensive retinopathy. She fell October 5th 2017, thy were at [facility] and she was [place] the [place], fell [place] a root and slammed into the hard sand, she fractured her nose. She had numbness for months and then she started feeling back and now she has tingling feeling, buzzing. She has pain and numbness in the right cheek area, sometimes hard to talk in the morning and she feels her face is pulling and the corner of her eye gets twitchy. Her eye twitches and closes and can cause vision changes. The twitching is often. She ha some right ptosis. Twitching is agravating and can occur on and off all day. Denies weakness but there is asymmetry, difficult to talk. The facial pain can be moderate in severity, the facial pain is continuous. No other focal neurologic deficits, associated symptoms, inciting events or modifiable factors.  Reviewed notes, labs and imaging from outside physicians, which showed:  Reviewed notes from Dr. Ernesto Rutherford. Patient was at [facility] felt tripped over a root and fell flat on her face and fracture her nose. She had a nondisplaced nasal bone fracture seen in radiology. Swelling is much improved but still continuing to have pain around the right cheek and some numbness in the right cheek. Evaluation showed ears to be clear, tympanic membranes look excellent, her nose is undisplaced but is tender, septum is also straight without evidence of hematoma,, dentition appears normal, trachea clear. Diagnosed with a nondisplaced nasal fracture with trauma to the right infraorbital  nerve with numbness but no evidence of fracture or displacement.   Reviewed labs CMP in June 2017 showed elevated glucose, BUN and creatinine: 189, 23, 1.01 respectively.  Review of Systems: Patient complains of symptoms per HPI as well as the following symptoms: feeling cold, joint pain, aching muscles. Pertinent negatives per HPI. All others negative.   Social History   Social History  . Marital status: Single    Spouse name: N/A  . Number of children: 0  . Years of education: Some college   Occupational History  . [employer]    Social History Main Topics  . Smoking status: Never Smoker  . Smokeless tobacco: Never Used  . Alcohol use No     Comment: 1/2 glass yearly  . Drug use: No  . Sexual activity: Not on file   Other Topics Concern  . Not on file   Social History Narrative   Lives alone w/ her 3 cats   Right-handed   Caffeine: 1 cup coffee/tea per day    Family History  Problem Relation Age of Onset  . Failure to thrive Mother   . Heart failure Father   . Neuropathy Neg Hx     Past Medical History:  Diagnosis Date  . Diabetes mellitus   . Diverticulitis   . Fibromyalgia   . Hypercholesteremia   . Hypertension   . IBS (irritable bowel syndrome)   . Migraines   . Nasal fracture   . Stein-Leventhal ovaries     Past Surgical History:  Procedure Laterality Date  . CHOLECYSTECTOMY  1998  . KNEE SURGERY Right  2017  . LEFT HEART CATHETERIZATION WITH CORONARY ANGIOGRAM N/A 12/19/2011   Procedure: LEFT HEART CATHETERIZATION WITH CORONARY ANGIOGRAM;  Surgeon: Candee Furbish, MD;  Location: [facility] CATH LAB;  Service: Cardiovascular;  Laterality: N/A;  . SHOULDER SURGERY    . TONSILLECTOMY  1959    Current Outpatient Prescriptions  Medication Sig Dispense Refill  . acetaminophen-codeine (TYLENOL #3) 300-30 MG per tablet Take 1 tablet by mouth every 4 (four) hours as needed. pain    . baclofen (LIORESAL) 10 MG tablet Take 1 tablet (10 mg total) by mouth  3 (three) times daily as needed. Muscle spasms 30 each 11  . cholecalciferol (VITAMIN D) 1000 UNITS tablet Take 1,000 Units by mouth daily.    . Cyanocobalamin (VITAMIN B-12 PO) Take 1 tablet by mouth every evening.    . hydrochlorothiazide (HYDRODIURIL) 25 MG tablet Take 12.5 mg by mouth daily.    Marland Kitchen HYDROcodone-acetaminophen (NORCO/VICODIN) 5-325 MG tablet Take 1-2 tablets by mouth every 4 (four) hours as needed. 15 tablet 0  . Insulin NPH Human, Isophane, (NOVOLIN N [place]) Inject 12 Units into the skin at bedtime.    . metoprolol (LOPRESSOR) 50 MG tablet Take 25 mg by mouth 2 (two) times daily.  0  . pioglitazone (ACTOS) 15 MG tablet Take 15 mg by mouth daily.     Marland Kitchen PRESCRIPTION MEDICATION Patient receives a Steroid injection in the neck once a year with Dr. Mina Marble at [facility] pt is unable to recall the exact name and i have been unable to verify name from the Dr's office. Patient also states " it shoots up my blood sugars really high, but i still get it because i can't take anything else for my neck pain"    . promethazine (PHENERGAN) 25 MG suppository Place 25 mg rectally every 6 (six) hours as needed for nausea or vomiting.    . gabapentin (NEURONTIN) 100 MG capsule Take 1 capsule (100 mg total) by mouth 3 (three) times daily. 90 capsule 12   No current facility-administered medications for this visit.    Facility-Administered Medications Ordered in Other Visits  Medication Dose Route Frequency Provider Last Rate Last Dose  . methylPREDNISolone sodium succinate (SOLU-MEDROL) 130 mg in sodium chloride 0.9 % 50 mL IVPB  130 mg Intravenous Once Jerline Pain, MD        Allergies as of 06/04/2016 - Review Complete 06/04/2016  Allergen Reaction Noted  . Contrast media [iodinated diagnostic agents] Hives 05/12/2011  . Augmentin [amoxicillin-pot clavulanate] Nausea And Vomiting 05/12/2011  . Erythromycin  06/04/2016  . Metformin and related Diarrhea and Nausea And Vomiting 06/04/2016  .  Nsaids Other (See Comments) 07/07/2015  . Prednisone Other (See Comments) 05/12/2011  . Statins Other (See Comments) 07/07/2015  . Sulfa antibiotics Other (See Comments) 09/09/2013  . Invokana [canagliflozin] Rash and Other (See Comments) 06/04/2016    Vitals: BP 122/77   Pulse 87   Ht 5\' 5"  (1.651 m)   Wt 177 lb (80.3 kg)   BMI 29.45 kg/m  Last Weight:  Wt Readings from Last 1 Encounters:  06/04/16 177 lb (80.3 kg)   Last Height:   Ht Readings from Last 1 Encounters:  06/04/16 5\' 5"  (1.651 m)   Physical exam: Exam: Gen: NAD, conversant, well nourised, overweight, well groomed                     CV: RRR, no MRG. No Carotid Bruits. No peripheral edema, warm, nontender Eyes: Conjunctivae clear  without exudates or hemorrhage  Neuro: Detailed Neurologic Exam  Speech:    Speech is normal; fluent and spontaneous with normal comprehension.  Cognition:    The patient is oriented to person, place, and time;     recent and remote memory intact;     language fluent;     normal attention, concentration,     fund of knowledge Cranial Nerves:    The pupils are equal, round, and reactive to light. The fundi are normal and spontaneous venous pulsations are present. Visual fields are full to finger confrontation. Extraocular movements are intact. Trigeminal sensation is impaired on the left and the muscles of mastication are normal. Right ptosis, mild right NL flattening. . The palate elevates in the midline. Hearing intact. Voice is normal. Shoulder shrug is normal. The tongue has normal motion without fasciculations.   Coordination:    No dysmetria  Gait:    Not ataxic  Motor Observation:    No asymmetry, no atrophy, and no involuntary movements noted. Tone:    Normal muscle tone.    Posture:    Posture is normal. normal erect    Strength: mild weakness UE due to cervical pain. Otherwise strength is V/V in the upper and lower limbs.      Sensation: intact to LT       Reflex Exam:  DTR's: Hypo lowers (surgical). Deep tendon reflexes in the upper extremities are normal bilaterally.   Toes:    The toes are downgoing bilaterally.   Clonus:    Clonus is absent.       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 Had a long discussion with patient and showed her images of the distribution of the trigeminal nerve. 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 Treatment of trigeminal neuralgia includes medications, nerve blocks, Botox injections, radiofrequency nerve ablation. We will start with a low-dose of gabapentin. Asked her to email me with her progress.   Cc: Dr. Ernesto Rutherford and Dr. Marcy Siren, [facility] [address] [place], [place] [address]  Phone [phone] Fax [phone]

## 2016-06-05 ENCOUNTER — Telehealth: Payer: Self-pay

## 2016-06-05 NOTE — Telephone Encounter (Signed)
Received 06/04/16 OV notes from Dr. Ernesto Rutherford who saw pt for R infraorbital trauma secondary to nondisplaced nasal fracture and trauma to her R intraorbital region of 7 mos duration. No evidence of fx or displacement. Her further diagnoses are diabetic retinopathy but well under control, hypertensive retinopathy, well uncer control; migraine headache, fibromyalgia. Sent to med records for scanning, copy to Dr. Jaynee Eagles for review. Pt was seen in our office yesterday for neuro referral.

## 2016-07-09 DIAGNOSIS — E11319 Type 2 diabetes mellitus with unspecified diabetic retinopathy without macular edema: Secondary | ICD-10-CM | POA: Diagnosis not present

## 2016-07-09 DIAGNOSIS — E559 Vitamin D deficiency, unspecified: Secondary | ICD-10-CM | POA: Diagnosis not present

## 2016-07-09 DIAGNOSIS — M797 Fibromyalgia: Secondary | ICD-10-CM | POA: Diagnosis not present

## 2016-07-09 DIAGNOSIS — E7800 Pure hypercholesterolemia, unspecified: Secondary | ICD-10-CM | POA: Diagnosis not present

## 2016-07-09 DIAGNOSIS — F419 Anxiety disorder, unspecified: Secondary | ICD-10-CM | POA: Diagnosis not present

## 2016-07-09 DIAGNOSIS — I10 Essential (primary) hypertension: Secondary | ICD-10-CM | POA: Diagnosis not present

## 2016-07-09 DIAGNOSIS — Z794 Long term (current) use of insulin: Secondary | ICD-10-CM | POA: Diagnosis not present

## 2016-07-09 DIAGNOSIS — G43909 Migraine, unspecified, not intractable, without status migrainosus: Secondary | ICD-10-CM | POA: Diagnosis not present

## 2016-07-09 DIAGNOSIS — E1165 Type 2 diabetes mellitus with hyperglycemia: Secondary | ICD-10-CM | POA: Diagnosis not present

## 2016-10-11 DIAGNOSIS — M722 Plantar fascial fibromatosis: Secondary | ICD-10-CM | POA: Diagnosis not present

## 2016-10-15 DIAGNOSIS — M79671 Pain in right foot: Secondary | ICD-10-CM | POA: Diagnosis not present

## 2016-10-17 DIAGNOSIS — M79671 Pain in right foot: Secondary | ICD-10-CM | POA: Diagnosis not present

## 2016-10-29 DIAGNOSIS — M79671 Pain in right foot: Secondary | ICD-10-CM | POA: Diagnosis not present

## 2016-10-31 DIAGNOSIS — M79671 Pain in right foot: Secondary | ICD-10-CM | POA: Diagnosis not present

## 2016-11-06 DIAGNOSIS — M79671 Pain in right foot: Secondary | ICD-10-CM | POA: Diagnosis not present

## 2016-11-11 DIAGNOSIS — M79671 Pain in right foot: Secondary | ICD-10-CM | POA: Diagnosis not present

## 2016-11-22 DIAGNOSIS — Z23 Encounter for immunization: Secondary | ICD-10-CM | POA: Diagnosis not present

## 2016-12-05 ENCOUNTER — Ambulatory Visit: Payer: Medicare Other | Admitting: Neurology

## 2016-12-09 DIAGNOSIS — E119 Type 2 diabetes mellitus without complications: Secondary | ICD-10-CM | POA: Diagnosis not present

## 2016-12-09 DIAGNOSIS — E7800 Pure hypercholesterolemia, unspecified: Secondary | ICD-10-CM | POA: Diagnosis not present

## 2016-12-11 ENCOUNTER — Encounter: Payer: Self-pay | Admitting: Internal Medicine

## 2016-12-11 DIAGNOSIS — R05 Cough: Secondary | ICD-10-CM | POA: Diagnosis not present

## 2016-12-30 DIAGNOSIS — J209 Acute bronchitis, unspecified: Secondary | ICD-10-CM | POA: Diagnosis not present

## 2017-01-06 ENCOUNTER — Encounter: Payer: Self-pay | Admitting: Neurology

## 2017-01-06 ENCOUNTER — Ambulatory Visit (INDEPENDENT_AMBULATORY_CARE_PROVIDER_SITE_OTHER): Payer: Medicare Other | Admitting: Neurology

## 2017-01-06 ENCOUNTER — Encounter (INDEPENDENT_AMBULATORY_CARE_PROVIDER_SITE_OTHER): Payer: Self-pay

## 2017-01-06 VITALS — BP 132/83 | HR 67 | Ht 65.0 in | Wt 184.0 lb

## 2017-01-06 DIAGNOSIS — G500 Trigeminal neuralgia: Secondary | ICD-10-CM

## 2017-01-06 DIAGNOSIS — Z79899 Other long term (current) drug therapy: Secondary | ICD-10-CM | POA: Diagnosis not present

## 2017-01-06 MED ORDER — CARBAMAZEPINE ER 200 MG PO TB12: 200.0000 mg | ORAL_TABLET | Freq: Two times a day (BID) | ORAL | 6 refills | Status: DC

## 2017-01-06 NOTE — Patient Instructions (Signed)
Carbamazepine tablets What is this medicine? CARBAMAZEPINE (kar ba MAZ e peen) is used to control seizures caused by certain types of epilepsy. This medicine is also used to treat nerve related pain. It is not for common aches and pains. This medicine may be used for other purposes; ask your health care provider or pharmacist if you have questions. COMMON BRAND NAME(S): Epitol, Tegretol What should I tell my health care provider before I take this medicine? They need to know if you have any of these conditions: -Asian ancestry -bone marrow disease -glaucoma -heart disease or irregular heartbeat -kidney disease -liver disease -low blood counts, like low white cell, platelet, or red cell counts -porphyria -psychotic disorders -suicidal thoughts, plans, or attempt; a previous suicide attempt by you or a family member -an unusual or allergic reaction to carbamazepine, tricyclic antidepressants, phenytoin, phenobarbital or other medicines, foods, dyes, or preservatives -pregnant or trying to get pregnant -breast-feeding How should I use this medicine? Take this medicine by mouth with a glass of water. Follow the directions on the prescription label. Take this medicine with food. Take your doses at regular intervals. Do not take your medicine more often than directed. Do not stop taking this medicine except on the advice of your doctor or health care professional. A special MedGuide will be given to you by the pharmacist with each prescription and refill. Be sure to read this information carefully each time. Talk to your pediatrician regarding the use of this medicine in children. Special care may be needed. Overdosage: If you think you have taken too much of this medicine contact a poison control center or emergency room at once. NOTE: This medicine is only for you. Do not share this medicine with others. What if I miss a dose? If you miss a dose, take it as soon as you can. If it is almost  time for your next dose, take only that dose. Do not take double or extra doses. What may interact with this medicine? Do not take this medicine with any of the following medications: -certain medicines used to treat HIV infection or AIDS that are given in combination with cobicistat - delavirdine - MAOIs like Carbex, Eldepryl, Marplan, Nardil, and Parnate - nefazodone - oxcarbazepine This medicine may also interact with the following medications: - acetaminophen - acetazolamide - barbiturate medicines for inducing sleep or treating seizures, like phenobarbital - certain antibiotics like clarithromycin, erythromycin or troleandomycin - cimetidine - cyclosporine - danazol - dicumarol - doxycycline - female hormones, including estrogens and birth control pills - grapefruit juice - isoniazid, INH - levothyroxine and other thyroid hormones - lithium and other medicines to treat mood problems or psychotic disturbances - loratadine - medicines for angina or high blood pressure - medicines for cancer - medicines for depression or anxiety - medicines for sleep - medicines to treat fungal infections, like fluconazole, itraconazole or ketoconazole - medicines used to treat HIV infection or AIDS - methadone - niacinamide - praziquantel - propoxyphene - rifampin or rifabutin - seizure or epilepsy medicine - steroid medicines such as prednisone or cortisone - theophylline - tramadol - warfarin This list may not describe all possible interactions. Give your health care provider a list of all the medicines, herbs, non-prescription drugs, or dietary supplements you use. Also tell them if you smoke, drink alcohol, or use illegal drugs. Some items may interact with your medicine. What should I watch for while using this medicine? Visit your doctor or health care professional for a regular check  on your progress. Do not change brands or dosage forms of this  medicine without discussing the change with your doctor or health care professional. If you are taking this medicine for epilepsy (seizures) do not stop taking it suddenly. This increases the risk of seizures. Wear a Probation officer or necklace. Carry an identification card with information about your condition, medications, and doctor or health care professional. Dennis Bast may get drowsy, dizzy, or have blurred vision. Do not drive, use machinery, or do anything that needs mental alertness until you know how this medicine affects you. To reduce dizzy or fainting spells, do not sit or stand up quickly, especially if you are an older patient. Alcohol can increase drowsiness and dizziness. Avoid alcoholic drinks. Birth control pills may not work properly while you are taking this medicine. Talk to your doctor about using an extra method of birth control. This medicine can make you more sensitive to the sun. Keep out of the sun. If you cannot avoid being in the sun, wear protective clothing and use sunscreen. Do not use sun lamps or tanning beds/booths. The use of this medicine may increase the chance of suicidal thoughts or actions. Pay special attention to how you are responding while on this medicine. Any worsening of mood, or thoughts of suicide or dying should be reported to your health care professional right away. Women who become pregnant while using this medicine may enroll in the [place] Pregnancy Registry by calling [phone]. This registry collects information about the safety of antiepileptic drug use during pregnancy. What side effects may I notice from receiving this medicine? Side effects that you should report to your doctor or health care professional as soon as possible: -allergic reactions like skin rash, itching or hives, swelling of the face, lips, or tongue -breathing problems -changes in vision -confusion -dark urine -fast or irregular heartbeat -fever  or chills, sore throat -mouth ulcers -pain or difficulty passing urine -redness, blistering, peeling or loosening of the skin, including inside the mouth -ringing in the ears -seizures -stomach pain -swollen joints or muscle/joint aches and pains -unusual bleeding or bruising -unusually weak or tired -vomiting -worsening of mood, thoughts or actions of suicide or dying -yellowing of the eyes or skin Side effects that usually do not require medical attention (report to your doctor or health care professional if they continue or are bothersome): -clumsiness or unsteadiness -diarrhea or constipation -headache -increased sweating -nausea This list may not describe all possible side effects. Call your doctor for medical advice about side effects. You may report side effects to FDA at 1-800-FDA-1088. Where should I keep my medicine? Keep out of reach of children. Store at room temperature below 30 degrees C (86 degrees F). Keep container tightly closed. Protect from moisture. Throw away any unused medicine after the expiration date. NOTE: This sheet is a summary. It may not cover all possible information. If you have questions about this medicine, talk to your doctor, pharmacist, or health care provider.  2018 Elsevier/Gold Standard (2014-01-13 15:38:34)

## 2017-01-06 NOTE — Progress Notes (Signed)
[id number]    Provider:  Dr Jaynee Eagles Referring Provider: Leighton Ruff, MD Primary Care Physician:  Leighton Ruff, MD  CC:  Trigeminal neuralgia  Interval history 01/06/2017: Trigeminal neuralgia. 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 She can't tolerate the neurontin. She declines MRI trigeminal nerve. Will try tegretol. Also Discussed [facility] referral for radiofrequency ablation. Will check labs today, Baclofen helps with her neck pain and stiffness but not the trigeminal neuralgia. She has a lot of muscle spasms.    HPI:  [name] is a 66 y.o. female here as a referral from Dr. Ernesto Rutherford for facial pain. She has a past medical history of fracture of the nasal bones, diabetes, hyperlipidemia, hypertension, fibromyalgia, migraine headaches, hypertensive retinopathy. She fell October 5th 2017, thy were at [facility] and she was walking [place] the beach, fell over a root and slammed into the hard sand, she fractured her nose. She had numbness for months and then she started feeling back and now she has tingling feeling, buzzing. She has pain and numbness in the right cheek area, sometimes hard to talk in the morning and she feels her face is pulling and the corner of her eye gets twitchy. Her eye twitches and closes and can cause vision changes. The twitching is often. She ha some right ptosis. Twitching is agravating and can occur on and off all day. Denies weakness but there is asymmetry, difficult to talk. The facial pain can be moderate in severity, the facial pain is continuous. No other focal neurologic deficits, associated symptoms, inciting events or modifiable factors.  Reviewed notes, labs and imaging from outside physicians, which showed:  Reviewed notes from Dr. Ernesto Rutherford. Patient was at [facility] felt tripped over a root and fell flat on her face and fracture her nose. She had a nondisplaced nasal bone fracture seen in  radiology. Swelling is much improved but still continuing to have pain around the right cheek and some numbness in the right cheek. Evaluation showed ears to be clear, tympanic membranes look excellent, her nose is undisplaced but is tender, septum is also straight without evidence of hematoma,, dentition appears normal, trachea clear. Diagnosed with a nondisplaced nasal fracture with trauma to the right infraorbital nerve with numbness but no evidence of fracture or displacement.   Reviewed labs CMP in June 2017 showed elevated glucose, BUN and creatinine: 189, 23, 1.01 respectively.  Review of Systems: Patient complains of symptoms per HPI as well as the following symptoms: cold intolerance, aching muscles, neck pain, muscle stiffness. Pertinent negatives per HPI. All others negative.     Social History   Socioeconomic History  . Marital status: Single    Spouse name: Not on file  . Number of children: 0  . Years of education: Some college  . Highest education level: Not on file  Social Needs  . Financial resource strain: Not on file  . Food insecurity - worry: Not on file  . Food insecurity - inability: Not on file  . Transportation needs - medical: Not on file  . Transportation needs - non-medical: Not on file  Occupational History  . Occupation: accounting    Comment: [employer]  Tobacco Use  . Smoking status: Never Smoker  . Smokeless tobacco: Never Used  Substance and Sexual Activity  . Alcohol use: No    Comment: 1/2 glass yearly  . Drug use: No  . Sexual activity: Not on file  Other Topics Concern  .  Not on file  Social History Narrative   Lives alone w/ her 3 cats   Right-handed   Caffeine: 2 cups coffee/tea per day    Family History  Adopted: Yes  Problem Relation Age of Onset  . Failure to thrive Mother   . Heart failure Father   . Neuropathy Neg Hx     Past Medical History:  Diagnosis Date  . Diabetes mellitus   . Diverticulitis   .  Fibromyalgia   . Hypercholesteremia   . Hypertension   . IBS (irritable bowel syndrome)   . Migraines   . Nasal fracture   . Stein-Leventhal ovaries     Past Surgical History:  Procedure Laterality Date  . CHOLECYSTECTOMY  1998  . KNEE SURGERY Right 2017  . LEFT HEART CATHETERIZATION WITH CORONARY ANGIOGRAM N/A 12/19/2011   Procedure: LEFT HEART CATHETERIZATION WITH CORONARY ANGIOGRAM;  Surgeon: Candee Furbish, MD;  Location: [facility] CATH LAB;  Service: Cardiovascular;  Laterality: N/A;  . SHOULDER SURGERY    . TONSILLECTOMY  1959    Current Outpatient Medications  Medication Sig Dispense Refill  . acetaminophen-codeine (TYLENOL #3) 300-30 MG per tablet Take 1 tablet by mouth every 4 (four) hours as needed. pain    . aspirin EC 81 MG tablet Take 81 mg by mouth every evening.    . baclofen (LIORESAL) 10 MG tablet Take 1 tablet (10 mg total) by mouth 3 (three) times daily as needed. Muscle spasms 30 each 11  . benzonatate (TESSALON) 200 MG capsule Take 200 mg by mouth 3 (three) times daily as needed for cough.    . cholecalciferol (VITAMIN D) 1000 UNITS tablet Take 1,000 Units by mouth daily.    Marland Kitchen gabapentin (NEURONTIN) 100 MG capsule Take 1 capsule (100 mg total) by mouth 3 (three) times daily. 90 capsule 12  . GuaiFENesin (ROBAFEN PO) Take by mouth. 1 teaspoon at night as needed for cough    . hydrochlorothiazide (HYDRODIURIL) 25 MG tablet Take 12.5 mg by mouth daily.    Marland Kitchen HYDROcodone-acetaminophen (NORCO) 7.5-325 MG tablet Take 1 tablet by mouth every 6 (six) hours as needed for moderate pain.    . Insulin NPH Human, Isophane, (NOVOLIN N [place]) Inject 20 Units into the skin at bedtime.     . metoprolol (LOPRESSOR) 50 MG tablet Take 25 mg by mouth 2 (two) times daily.  0  . pioglitazone (ACTOS) 15 MG tablet Take 15 mg by mouth daily.     Marland Kitchen PRESCRIPTION MEDICATION Patient receives a Steroid injection in the neck once a year with Dr. Mina Marble at [facility] pt is unable to recall the exact  name and i have been unable to verify name from the Dr's office. Patient also states " it shoots up my blood sugars really high, but i still get it because i can't take anything else for my neck pain"    . promethazine (PHENERGAN) 25 MG suppository Place 25 mg rectally every 6 (six) hours as needed for nausea or vomiting.    . carbamazepine (TEGRETOL-XR) 200 MG 12 hr tablet Take 1 tablet (200 mg total) by mouth 2 (two) times daily. 60 tablet 6   No current facility-administered medications for this visit.    Facility-Administered Medications Ordered in Other Visits  Medication Dose Route Frequency Provider Last Rate Last Dose  . methylPREDNISolone sodium succinate (SOLU-MEDROL) 130 mg in sodium chloride 0.9 % 50 mL IVPB  130 mg Intravenous Once Jerline Pain, MD  Allergies as of 01/06/2017 - Review Complete 01/06/2017  Allergen Reaction Noted  . Contrast media [iodinated diagnostic agents] Hives 05/12/2011  . Augmentin [amoxicillin-pot clavulanate] Nausea And Vomiting 05/12/2011  . Erythromycin  06/04/2016  . Metformin and related Diarrhea and Nausea And Vomiting 06/04/2016  . Nsaids Other (See Comments) 07/07/2015  . Prednisone Other (See Comments) 05/12/2011  . Statins Other (See Comments) 07/07/2015  . Sulfa antibiotics Other (See Comments) 09/09/2013  . Invokana [canagliflozin] Rash and Other (See Comments) 06/04/2016    Vitals: BP 132/83 (BP Location: Right Arm, Patient Position: Sitting)   Pulse 67   Ht 5\' 5"  (1.651 m)   Wt 184 lb (83.5 kg) Comment: per pt report. she states she does not weigh at MD offices  BMI 30.62 kg/m  Last Weight:  Wt Readings from Last 1 Encounters:  01/06/17 184 lb (83.5 kg)   Last Height:   Ht Readings from Last 1 Encounters:  01/06/17 5\' 5"  (1.651 m)     Physical exam: Exam: Gen: NAD, conversant, well nourised, overweight, well groomed                     CV: RRR, no MRG. No Carotid Bruits. No peripheral edema, warm, nontender Eyes:  Conjunctivae clear without exudates or hemorrhage  Neuro: Detailed Neurologic Exam  Speech:    Speech is normal; fluent and spontaneous with normal comprehension.  Cognition:    The patient is oriented to person, place, and time;     recent and remote memory intact;     language fluent;     normal attention, concentration,     fund of knowledge Cranial Nerves:    The pupils are equal, round, and reactive to light. The fundi are normal and spontaneous venous pulsations are present. Visual fields are full to finger confrontation. Extraocular movements are intact. Trigeminal sensation is impaired on the left and the muscles of mastication are normal. Right ptosis, mild right NL flattening. . The palate elevates in the midline. Hearing intact. Voice is normal. Shoulder shrug is normal. The tongue has normal motion without fasciculations.   Coordination:    No dysmetria  Gait:    Not ataxic  Motor Observation:    No asymmetry, no atrophy, and no involuntary movements noted. Tone:    Normal muscle tone.    Posture:    Posture is normal. normal erect    Strength: mild weakness UE due to cervical pain. Otherwise strength is V/V in the upper and lower limbs.      Sensation: intact to LT     Reflex Exam:  DTR's: Hypo lowers (surgical). Deep tendon reflexes in the upper extremities are normal bilaterally.   Toes:    The toes are downgoing bilaterally.   Clonus:    Clonus is absent.       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 Had a long discussion with patient and showed her images of the distribution of the trigeminal nerve. 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 Treatment of trigeminal neuralgia includes medications, nerve blocks, Botox  injections, radiofrequency nerve ablation.   Started a a low-dose of gabapentin and baclofen. Asked her to email me with her progress which she did not. Can try Tegretol 200mg  bid, may need to increase.  Labs today and in 4 weeks. Cannot tolerate anymore increase in Gabapentin however is helping her muscles along with the baclofen MRI of the face trigeminal protocol, she declined    Cc: Dr. Ernesto Rutherford and Dr. Marcy Siren, [facility] [address] [place], [place] [address]  Phone [phone] Fax [phone]  A total of 25 minutes was spent face-to-face with this patient. Over half this time was spent on counseling patient on the Trigeminal neuralgia diagnosis and different diagnostic and therapeutic options available.

## 2017-01-07 ENCOUNTER — Telehealth: Payer: Self-pay | Admitting: *Deleted

## 2017-01-07 LAB — CBC
HEMATOCRIT: 41.3 % (ref 34.0–46.6)
Hemoglobin: 13.6 g/dL (ref 11.1–15.9)
MCH: 30.1 pg (ref 26.6–33.0)
MCHC: 32.9 g/dL (ref 31.5–35.7)
MCV: 91 fL (ref 79–97)
Platelets: 232 10*3/uL (ref 150–379)
RBC: 4.52 x10E6/uL (ref 3.77–5.28)
RDW: 14.3 % (ref 12.3–15.4)
WBC: 8.7 10*3/uL (ref 3.4–10.8)

## 2017-01-07 LAB — COMPREHENSIVE METABOLIC PANEL
A/G RATIO: 1.5 (ref 1.2–2.2)
ALBUMIN: 4 g/dL (ref 3.6–4.8)
ALT: 14 IU/L (ref 0–32)
AST: 14 IU/L (ref 0–40)
Alkaline Phosphatase: 105 IU/L (ref 39–117)
BILIRUBIN TOTAL: 0.4 mg/dL (ref 0.0–1.2)
BUN / CREAT RATIO: 17 (ref 12–28)
BUN: 14 mg/dL (ref 8–27)
CALCIUM: 9.4 mg/dL (ref 8.7–10.3)
CO2: 24 mmol/L (ref 20–29)
Chloride: 99 mmol/L (ref 96–106)
Creatinine, Ser: 0.81 mg/dL (ref 0.57–1.00)
GFR, EST AFRICAN AMERICAN: 87 mL/min/{1.73_m2} (ref >59)
GFR, EST NON AFRICAN AMERICAN: 75 mL/min/{1.73_m2} (ref >59)
GLOBULIN, TOTAL: 2.6 g/dL (ref 1.5–4.5)
Glucose: 263 mg/dL — ABNORMAL HIGH (ref 65–99)
POTASSIUM: 3.5 mmol/L (ref 3.5–5.2)
SODIUM: 141 mmol/L (ref 134–144)
TOTAL PROTEIN: 6.6 g/dL (ref 6.0–8.5)

## 2017-01-07 NOTE — Telephone Encounter (Signed)
-----   Message from Melvenia Beam, MD sent at 01/07/2017  9:07 AM EST ----- Glucose elevated at 263 otherwise normal thanks

## 2017-01-07 NOTE — Telephone Encounter (Signed)
Called and LVM (ok per DPR) informing patient of lab results: elevated Glucose 263 otherwise normal labs. Left office number for patient to call back if she has any questions.

## 2017-01-20 DIAGNOSIS — E119 Type 2 diabetes mellitus without complications: Secondary | ICD-10-CM | POA: Diagnosis not present

## 2017-01-20 DIAGNOSIS — E11319 Type 2 diabetes mellitus with unspecified diabetic retinopathy without macular edema: Secondary | ICD-10-CM | POA: Diagnosis not present

## 2017-01-20 DIAGNOSIS — E7800 Pure hypercholesterolemia, unspecified: Secondary | ICD-10-CM | POA: Diagnosis not present

## 2017-01-20 DIAGNOSIS — Z794 Long term (current) use of insulin: Secondary | ICD-10-CM | POA: Diagnosis not present

## 2017-01-20 DIAGNOSIS — E559 Vitamin D deficiency, unspecified: Secondary | ICD-10-CM | POA: Diagnosis not present

## 2017-01-20 DIAGNOSIS — E1165 Type 2 diabetes mellitus with hyperglycemia: Secondary | ICD-10-CM | POA: Diagnosis not present

## 2017-01-20 DIAGNOSIS — I10 Essential (primary) hypertension: Secondary | ICD-10-CM | POA: Diagnosis not present

## 2017-03-24 ENCOUNTER — Ambulatory Visit (INDEPENDENT_AMBULATORY_CARE_PROVIDER_SITE_OTHER): Payer: Medicare Other | Admitting: Internal Medicine

## 2017-03-24 ENCOUNTER — Encounter: Payer: Self-pay | Admitting: Internal Medicine

## 2017-03-24 VITALS — BP 116/72 | HR 58 | Ht 65.0 in

## 2017-03-24 DIAGNOSIS — E11319 Type 2 diabetes mellitus with unspecified diabetic retinopathy without macular edema: Secondary | ICD-10-CM

## 2017-03-24 DIAGNOSIS — Z794 Long term (current) use of insulin: Secondary | ICD-10-CM | POA: Diagnosis not present

## 2017-03-24 DIAGNOSIS — E1165 Type 2 diabetes mellitus with hyperglycemia: Secondary | ICD-10-CM

## 2017-03-24 LAB — POCT GLYCOSYLATED HEMOGLOBIN (HGB A1C): HEMOGLOBIN A1C: 10.9

## 2017-03-24 MED ORDER — DULAGLUTIDE 0.75 MG/0.5ML ~~LOC~~ SOAJ: 0.7500 mg | SUBCUTANEOUS | 1 refills | Status: DC

## 2017-03-24 MED ORDER — BASAGLAR KWIKPEN 100 UNIT/ML ~~LOC~~ SOPN: 30.0000 [IU] | PEN_INJECTOR | Freq: Every day | SUBCUTANEOUS | 11 refills | Status: DC

## 2017-03-24 MED ORDER — INSULIN PEN NEEDLE 32G X 4 MM MISC: 3 refills | Status: DC

## 2017-03-24 NOTE — Progress Notes (Signed)
Patient ID: [name], female   DOB: Jul 07, 1950, 67 y.o.   MRN: [id number]   HPI: [name] is a 67 y.o.-year-old female, referred by her PCP, Dr. Drema Dallas, for management of DM2, dx in 2006-2007, insulin-dependent since 2017, uncontrolled, without long term complications.  She saw Dr. Buddy Duty in the past.  Last hemoglobin A1c was: 12/09/2016: HbA1c 10.7% 07/10/2016: HbA1c 9.6% No results found for: HGBA1C  Pt is on a regimen of: - NPH 25 units at bedtime - Actos 15 mg in a.m.  She has tried:  Metformin >> stomach cramps, diarrhea  Invokana >> dry mouth, rash  Pt checks her sugars 3-4x a week and they are: - am: 168-188 - 2h after b'fast: n/c - before lunch: n/c - 2h after lunch: n/c - before dinner: n/c - 2h after dinner: n/c - bedtime: n/c - nighttime: n/c No lows. Lowest sugar was 128; unclear if she has hypoglycemia awareness Highest sugar was 263 on CMP 01/2017.  Glucometer: Accu-Chek  Pt's meals are: - Breakfast: oatmeal, Kashi cereal, English muffin, whole wheat bread - Lunch: sandwich, salad - Dinner: (late, 8 pm): veggies, meat or pasta or eggs + toast (largest) - Snacks: none + Coke 0, tea + Splenda  - no CKD, last BUN/creatinine:  Lab Results  Component Value Date   BUN 14 01/06/2017   BUN 23 (H) 07/07/2015   CREATININE 0.81 01/06/2017   CREATININE 1.01 (H) 07/07/2015  07/10/2016: ACR 5.5  - last set of lipids: 12/10/2016: 226/113/57/147. No results found for: CHOL, HDL, LDLCALC, LDLDIRECT, TRIG, CHOLHDL  Lovastatin and atorvastatin caused muscle aches.  She also could not tolerate rosuvastatin.  - last eye exam was in 03/2016. + DR dx'ed in the past >> now resolved.  - no numbness and tingling in her feet.   She is on Neurontin 100 mg 3 times a day for trigeminal neuralgia.  On ASA 81 intermittently.  Unclear FH of DM - pt is adopted.  She also has a history of HTN, fibromyalgia, vitamin D deficiency, PCO S, fatty liver, kidney stones,  trigeminal neuralgia, migraines.  ROS: Constitutional: + Fatigue, + weight gain,  no subjective hyperthermia/hypothermia Eyes: no blurry vision, no xerophthalmia ENT: no sore throat, no nodules palpated in throat, no dysphagia/odynophagia, no hoarseness, + decreased hearing Cardiovascular: no CP/SOB/palpitations/+ leg swelling Respiratory: no cough/SOB Gastrointestinal: no N/V/D/C Musculoskeletal: + Both: Muscle/joint aches Skin: no rashes Neurological: no tremors/numbness/tingling/dizziness, + migraines Psychiatric: no depression/+ anxiety for the past year  Past Medical History:  Diagnosis Date  . Diabetes mellitus   . Diverticulitis   . Fibromyalgia   . Hypercholesteremia   . Hypertension   . IBS (irritable bowel syndrome)   . Migraines   . Nasal fracture   . Stein-Leventhal ovaries    Past Surgical History:  Procedure Laterality Date  . CHOLECYSTECTOMY  1998  . KNEE SURGERY Right 2017  . LEFT HEART CATHETERIZATION WITH CORONARY ANGIOGRAM N/A 12/19/2011   Procedure: LEFT HEART CATHETERIZATION WITH CORONARY ANGIOGRAM;  Surgeon: Candee Furbish, MD;  Location[facility] C[facility] LAB;  Service: Cardiovascular;  Laterality: N/A;  . SHOULDER SURGERY    . TONSILLECTOMY  1959   Social History   Socioeconomic History  . Marital status: Single    Spouse name: Not on file  . Number of children: 0  . Years of education: Some college  . Highest education level: Not on file  Social Needs  . Financial resource strain: Not on file  . Food insecurity -  worry: Not on file  . Food insecurity - inability: Not on file  . Transportation needs - medical: Not on file  . Transportation needs - non-medical: Not on file  Occupational History  . Occupation: accounting    Comment: [employer]  Tobacco Use  . Smoking status: Never Smoker  . Smokeless tobacco: Never Used  Substance and Sexual Activity  . Alcohol use: No    Comment: 1/2 glass yearly  . Drug use: No  . Sexual activity: Not on  file  Other Topics Concern  . Not on file  Social History Narrative   Lives alone w/ her 3 cats   Right-handed   Caffeine: 2 cups coffee/tea per day   Current Outpatient Medications on File Prior to Visit  Medication Sig Dispense Refill  . acetaminophen-codeine (TYLENOL #3) 300-30 MG per tablet Take 1 tablet by mouth every 4 (four) hours as needed. pain    . aspirin EC 81 MG tablet Take 81 mg by mouth every evening.    . baclofen (LIORESAL) 10 MG tablet Take 1 tablet (10 mg total) by mouth 3 (three) times daily as needed. Muscle spasms 30 each 11  . benzonatate (TESSALON) 200 MG capsule Take 200 mg by mouth 3 (three) times daily as needed for cough.    . carbamazepine (TEGRETOL-XR) 200 MG 12 hr tablet Take 1 tablet (200 mg total) by mouth 2 (two) times daily. 60 tablet 6  . cholecalciferol (VITAMIN D) 1000 UNITS tablet Take 1,000 Units by mouth daily.    Marland Kitchen gabapentin (NEURONTIN) 100 MG capsule Take 1 capsule (100 mg total) by mouth 3 (three) times daily. 90 capsule 12  . GuaiFENesin (ROBAFEN PO) Take by mouth. 1 teaspoon at night as needed for cough    . hydrochlorothiazide (HYDRODIURIL) 25 MG tablet Take 12.5 mg by mouth daily.    Marland Kitchen HYDROcodone-acetaminophen (NORCO) 7.5-325 MG tablet Take 1 tablet by mouth every 6 (six) hours as needed for moderate pain.    . Insulin NPH Human, Isophane, (NOVOLIN N [place]) Inject 20 Units into the skin at bedtime.     . metoprolol (LOPRESSOR) 50 MG tablet Take 25 mg by mouth 2 (two) times daily.  0  . pioglitazone (ACTOS) 15 MG tablet Take 15 mg by mouth daily.     Marland Kitchen PRESCRIPTION MEDICATION Patient receives a Steroid injection in the neck once a year with Dr. Mina Marble at [facility] pt is unable to recall the exact name and i have been unable to verify name from the Dr's office. Patient also states " it shoots up my blood sugars really high, but i still get it because i can't take anything else for my neck pain"    . promethazine (PHENERGAN) 25 MG  suppository Place 25 mg rectally every 6 (six) hours as needed for nausea or vomiting.     Current Facility-Administered Medications on File Prior to Visit  Medication Dose Route Frequency Provider Last Rate Last Dose  . methylPREDNISolone sodium succinate (SOLU-MEDROL) 130 mg in sodium chloride 0.9 % 50 mL IVPB  130 mg Intravenous Once Jerline Pain, MD       Allergies  Allergen Reactions  . Contrast Media [Iodinated Diagnostic Agents] Hives  . Augmentin [Amoxicillin-Pot Clavulanate] Nausea And Vomiting    Nausea, vomiting, headache, dizziness   pt states she can take other Penicillins just not augmentin  . Erythromycin   . Metformin And Related Diarrhea and Nausea And Vomiting    Violently ill for 2  yrs  . Nsaids Other (See Comments)    Stomach problems  . Prednisone Other (See Comments)    Abnormal blood sugars..  . Statins Other (See Comments)    Muscle pain/ weekness  . Sulfa Antibiotics Other (See Comments)    blackout  . Invokana [Canagliflozin] Rash and Other (See Comments)    Dehydration   Family History  Adopted: Yes  Problem Relation Age of Onset  . Failure to thrive Mother   . Heart failure Father   . Neuropathy Neg Hx    PE: BP 116/72 (BP Location: Left Arm, Patient Position: Sitting, Cuff Size: Normal)   Pulse (!) 58   Ht 5\' 5"  (1.651 m)   SpO2 97%   BMI 30.62 kg/m  Wt Readings from Last 3 Encounters:  01/06/17 184 lb (83.5 kg)  06/04/16 177 lb (80.3 kg)  12/19/11 174 lb (78.9 kg)   Constitutional: overweight, in NAD Eyes: PERRLA, EOMI, no exophthalmos ENT: moist mucous membranes, no thyromegaly, no cervical lymphadenopathy Cardiovascular: RRR, No MRG, + bilateral leg swelling-wears compression hose this Respiratory: CTA B Gastrointestinal: abdomen soft, NT, ND, BS+ Musculoskeletal: no deformities, strength intact in all 4 Skin: moist, warm, no rashes Neurological: no tremor with outstretched hands, DTR normal in all 4  ASSESSMENT: 1. DM2,  insulin-dependent, uncontrolled, without long-term complications, but with hyperglycemia  + hypertensive retinopathy  PLAN:  1. Patient with long-standing, uncontrolled diabetes, on oral antidiabetic regimen (with Actos) + intermediate acting insulin (NPH) used once a day at bedtime, which became insufficient. HbA1c 10.9% (stable, high). - She only checks sugars in the morning and they are high, but much lower than the right from the HbA1c.  I explained that this means that her sugars greatly increased at night, to the 300s.  Therefore, we need to change her regimen to cover her meals.  Also, we discussed that NPH is an intermediate acting insulin, which will not last her for 24 hours, but only 12.  Her sugars greatly increased after taking it at night, but they start increasing during the day.  Therefore, I advised her to switch to Lantus which will cover her for 24 hours.  For mealtime coverage, I suggested Trulicity.  We will start with a low dose to see how she tolerates it and then increase to a target dose of 1.5 mg daily.  For now, we will continue Actos Low-dose, but I plan to stop this at the next visit,  If possible.  I explained that for now we are trying to avoid mealtime insulin.  She greatly agrees, as she does not like needles. - I suggested to:  Patient Instructions  Please stop NPH.  Please start: - Basaglar 30 units at bedtime  Please start: - Trulicity 1.32 mg weekly. Let me know when you are close to running out to call in the higher dose to your pharmacy (1.5 mg).  Please continue: - Actos 15 mg daily in am.  Please return in 1.5 months with your sugar log.   - Strongly advised her to start checking sugars at different times of the day - check 2 times a day, rotating checks - given sugar log and advised how to fill it and to bring it at next appt  - given foot care handout and explained the principles  - given instructions for hypoglycemia management "15-15 rule"  -  advised for yearly eye exams  - Return to clinic in 1.5 mo with sugar log   Philemon Kingdom, MD  PhD [facility]

## 2017-03-24 NOTE — Patient Instructions (Addendum)
Please stop NPH.  Please start: - Basaglar 30 units at bedtime  Please start: - Trulicity 6.78 mg weekly. Let me know when you are close to running out to call in the higher dose to your pharmacy (1.5 mg).  Please continue: - Actos 15 mg daily in am.  Please return in 1.5 months with your sugar log.   PATIENT INSTRUCTIONS FOR TYPE 2 DIABETES:  DIET AND EXERCISE Diet and exercise is an important part of diabetic treatment.  We recommended aerobic exercise in the form of brisk walking (working between 40-60% of maximal aerobic capacity, similar to brisk walking) for 150 minutes per week (such as 30 minutes five days per week) along with 3 times per week performing 'resistance' training (using various gauge rubber tubes with handles) 5-10 exercises involving the major muscle groups (upper body, lower body and core) performing 10-15 repetitions (or near fatigue) each exercise. Start at half the above goal but build slowly to reach the above goals. If limited by weight, joint pain, or disability, we recommend daily walking in a swimming pool with water up to waist to reduce pressure from joints while allow for adequate exercise.    BLOOD GLUCOSES Monitoring your blood glucoses is important for continued management of your diabetes. Please check your blood glucoses 2-4 times a day: fasting, before meals and at bedtime (you can rotate these measurements - e.g. one day check before the 3 meals, the next day check before 2 of the meals and before bedtime, etc.).   HYPOGLYCEMIA (low blood sugar) Hypoglycemia is usually a reaction to not eating, exercising, or taking too much insulin/ other diabetes drugs.  Symptoms include tremors, sweating, hunger, confusion, headache, etc. Treat IMMEDIATELY with 15 grams of Carbs: . 4 glucose tablets .  cup regular juice/soda . 2 tablespoons raisins . 4 teaspoons sugar . 1 tablespoon honey Recheck blood glucose in 15 mins and repeat above if still  symptomatic/blood glucose <100.  RECOMMENDATIONS TO REDUCE YOUR RISK OF DIABETIC COMPLICATIONS: * Take your prescribed MEDICATION(S) * Follow a DIABETIC diet: Complex carbs, fiber rich foods, (monounsaturated and polyunsaturated) fats * AVOID saturated/trans fats, high fat foods, >2,300 mg salt per day. * EXERCISE at least 5 times a week for 30 minutes or preferably daily.  * DO NOT SMOKE OR DRINK more than 1 drink a day. * Check your FEET every day. Do not wear tightfitting shoes. Contact us if you develop an ulcer * See your EYE doctor once a year or more if needed * Get a FLU shot once a year * Get a PNEUMONIA vaccine once before and once after age 12 years  GOALS:  * Your Hemoglobin A1c of <7%  * fasting sugars need to be <130 * after meals sugars need to be <180 (2h after you start eating) * Your Systolic BP should be 938 or lower  * Your Diastolic BP should be 80 or lower  * Your HDL (Good Cholesterol) should be 40 or higher  * Your LDL (Bad Cholesterol) should be 100 or lower. * Your Triglycerides should be 150 or lower  * Your Urine microalbumin (kidney function) should be <30 * Your Body Mass Index should be 25 or lower    Please consider the following ways to cut down carbs and fat and increase fiber and micronutrients in your diet: - substitute whole grain for white bread or pasta - substitute brown rice for white rice - substitute 90-calorie flat bread pieces for slices of bread when  possible - substitute sweet potatoes or yams for white potatoes - substitute humus for margarine - substitute tofu for cheese when possible - substitute almond or rice milk for regular milk (would not drink soy milk daily due to concern for soy estrogen influence on breast cancer risk) - substitute dark chocolate for other sweets when possible - substitute water - can add lemon or orange slices for taste - for diet sodas (artificial sweeteners will trick your body that you can eat sweets  without getting calories and will lead you to overeating and weight gain in the long run) - do not skip breakfast or other meals (this will slow down the metabolism and will result in more weight gain over time)  - can try smoothies made from fruit and almond/rice milk in am instead of regular breakfast - can also try old-fashioned (not instant) oatmeal made with almond/rice milk in am - order the dressing on the side when eating salad at a restaurant (pour less than half of the dressing on the salad) - eat as little meat as possible - can try juicing, but should not forget that juicing will get rid of the fiber, so would alternate with eating raw veg./fruits or drinking smoothies - use as little oil as possible, even when using olive oil - can dress a salad with a mix of balsamic vinegar and lemon juice, for e.g. - use agave nectar, stevia sugar, or regular sugar rather than artificial sweateners - steam or broil/roast veggies  - snack on veggies/fruit/nuts (unsalted, preferably) when possible, rather than processed foods - reduce or eliminate aspartame in diet (it is in diet sodas, chewing gum, etc) Read the labels!  Try to read Dr. Janene Harvey book: "Program for Reversing Diabetes" for other ideas for healthy eating.

## 2017-04-18 DIAGNOSIS — H5213 Myopia, bilateral: Secondary | ICD-10-CM | POA: Diagnosis not present

## 2017-04-18 DIAGNOSIS — H524 Presbyopia: Secondary | ICD-10-CM | POA: Diagnosis not present

## 2017-04-18 DIAGNOSIS — H52223 Regular astigmatism, bilateral: Secondary | ICD-10-CM | POA: Diagnosis not present

## 2017-04-18 DIAGNOSIS — H2513 Age-related nuclear cataract, bilateral: Secondary | ICD-10-CM | POA: Diagnosis not present

## 2017-04-18 DIAGNOSIS — E119 Type 2 diabetes mellitus without complications: Secondary | ICD-10-CM | POA: Diagnosis not present

## 2017-04-22 DIAGNOSIS — J302 Other seasonal allergic rhinitis: Secondary | ICD-10-CM | POA: Diagnosis not present

## 2017-04-22 DIAGNOSIS — M545 Low back pain: Secondary | ICD-10-CM | POA: Diagnosis not present

## 2017-05-12 ENCOUNTER — Ambulatory Visit (INDEPENDENT_AMBULATORY_CARE_PROVIDER_SITE_OTHER): Payer: Medicare Other | Admitting: Internal Medicine

## 2017-05-12 ENCOUNTER — Encounter: Payer: Self-pay | Admitting: Internal Medicine

## 2017-05-12 VITALS — BP 114/78 | HR 76

## 2017-05-12 DIAGNOSIS — Z794 Long term (current) use of insulin: Secondary | ICD-10-CM

## 2017-05-12 DIAGNOSIS — E1165 Type 2 diabetes mellitus with hyperglycemia: Secondary | ICD-10-CM

## 2017-05-12 DIAGNOSIS — E785 Hyperlipidemia, unspecified: Secondary | ICD-10-CM | POA: Insufficient documentation

## 2017-05-12 MED ORDER — INSULIN REGULAR HUMAN 100 UNIT/ML IJ SOLN: 8.0000 [IU] | Freq: Two times a day (BID) | INTRAMUSCULAR | 11 refills | Status: DC

## 2017-05-12 MED ORDER — INSULIN NPH (HUMAN) (ISOPHANE) 100 UNIT/ML ~~LOC~~ SUSP: SUBCUTANEOUS | 11 refills | Status: DC

## 2017-05-12 NOTE — Progress Notes (Signed)
Patient ID: [name], female   DOB: 1950/08/13, 67 y.o.   MRN: [id number]   HPI: [name] is a 67 y.o.-year-old female, returning for f/u for DM2, dx in 2006-2007, insulin-dependent since 2017, uncontrolled, without long term complications.  She saw Dr. Buddy Sexton in the past. Last visit with me 1.5 mo ago.  Last hemoglobin A1c was: Lab Results  Component Value Date   HGBA1C 10.9 03/24/2017  12/09/2016: HbA1c 10.7% 07/10/2016: HbA1c 9.6%  Pt was on a regimen of: - NPH 25 units at bedtime - Actos 15 mg in a.m.  At last visit, we changed to: - Basaglar 30 units at bedtime >> but had to change back to NPH 25 units at bedtime as she developed muscle aches and congestion and she attributed these to [employer]. - Actos 15 mg in am She could not afford Trulicity.  She has tried:  Metformin >> stomach cramps, diarrhea  Invokana >> dry mouth, rash  Basaglar >> back muscle spasms, congestion  Pt checks her sugars 1x a day -cannot afford strips to check twice a day: - am: 168-188 >> 103-171, 239 (midnight snack) - 2h after b'fast: n/c >> 273, 283 - before lunch: n/c - 2h after lunch: n/c - before dinner: n/c >> 182 - 2h after dinner: n/c - bedtime: n/c - nighttime: n/c Lowest sugar was 128 >> 103; unclear if she has hypoglycemia awareness Highest sugar was 263 >> 273  Glucometer: Accu-Chek  Pt's meals are: - Breakfast: oatmeal, Kashi cereal, English muffin, whole wheat bread - Lunch: sandwich, salad - Dinner: (late, 8 pm): veggies, meat or pasta or eggs + toast (largest) - Snacks: none + Coke 0, tea + Splenda  - No CKD, last BUN/creatinine:  Lab Results  Component Value Date   BUN 14 01/06/2017   BUN 23 (H) 07/07/2015   CREATININE 0.81 01/06/2017   CREATININE 1.01 (H) 07/07/2015  07/10/2016: ACR 5.5  - + HL; last set of lipids: 12/10/2016: 226/113/57/147. No results found for: CHOL, HDL, LDLCALC, LDLDIRECT, TRIG, CHOLHDL  She is not on a statin.  Lovastatin and  atorvastatin caused muscle aches.  She also could not tolerate rosuvastatin.  - last eye exam was in 04/2017: No DR  - no numbness and tingling in her feet.   She is on Neurontin 100 mg 3 times a day for trigeminal neuralgia.  On ASA 81.  Unclear FH of DM - pt is adopted.  She also has a history of HTN, fibromyalgia, vitamin D deficiency, PCOS, fatty liver, kidney stones, trigeminal neuralgia, migraines.  ROS: Constitutional: + weight gain/no weight loss, + fatigue, no subjective hyperthermia, + subjective hypothermia Eyes: no blurry vision, no xerophthalmia ENT: no sore throat, no nodules palpated in throat, no dysphagia, no odynophagia, no hoarseness Cardiovascular: no CP/no SOB/no palpitations/no leg swelling Respiratory: no cough/no SOB/no wheezing Gastrointestinal: no N/no V/no D/no C/no acid reflux Musculoskeletal: + muscle aches/no joint aches Skin: no rashes, no hair loss Neurological: no tremors/no numbness/no tingling/no dizziness  I reviewed pt's medications, allergies, PMH, social hx, family hx, and changes were documented in the history of present illness. Otherwise, unchanged from my initial visit note.   Past Medical History:  Diagnosis Date  . Diabetes mellitus   . Diverticulitis   . Fibromyalgia   . Hypercholesteremia   . Hypertension   . IBS (irritable bowel syndrome)   . Migraines   . Nasal fracture   . Stein-Leventhal ovaries    Past Surgical History:  Procedure  Laterality Date  . CHOLECYSTECTOMY  1998  . KNEE SURGERY Right 2017  . LEFT HEART CATHETERIZATION WITH CORONARY ANGIOGRAM N/A 12/19/2011   Procedure: LEFT HEART CATHETERIZATION WITH CORONARY ANGIOGRAM;  Surgeon: Candee Furbish, MD;  Location: [facility] CATH LAB;  Service: Cardiovascular;  Laterality: N/A;  . SHOULDER SURGERY    . TONSILLECTOMY  1959   Social History   Socioeconomic History  . Marital status: Single    Spouse name: Not on file  . Number of children: 0  . Years of education: Some  college  . Highest education level: Not on file  Occupational History  . 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 Financial resource strain: Not on file  . Food insecurity:    Worry: Not on file    Inability: Not on file  . Transportation needs:    Medical: Not on file    Non-medical: Not on file  Tobacco Use  . Smoking status: Never Smoker  . Smokeless tobacco: Never Used  Substance and Sexual Activity  . Alcohol use: No    Comment: 1/2 glass yearly  . Drug use: No  . Sexual activity: Not on file  Lifestyle  . Physical activity:    Days per week: Not on file    Minutes per session: Not on file  . Stress: Not on file  Relationships  . Social connections:    Talks on phone: Not on file    Gets together: Not on file    Attends religious service: Not on file    Active member of club or organization: Not on file    Attends meetings of clubs or organizations: Not on file    Relationship status: Not on file  . Intimate partner violence:    Fear of current or ex partner: Not on file    Emotionally abused: Not on file    Physically abused: Not on file    Forced sexual activity: Not on file  Other Topics Concern  . Not on file  Social History Narrative   Lives alone w/ her 3 cats   Right-handed   Caffeine: 2 cups coffee/tea per day   Current Outpatient Medications on File Prior to Visit  Medication Sig Dispense Refill  . acetaminophen-codeine (TYLENOL #3) 300-30 MG per tablet Take 1 tablet by mouth every 4 (four) hours as needed. pain    . aspirin EC 81 MG tablet Take 81 mg by mouth every evening.    . baclofen (LIORESAL) 10 MG tablet Take 1 tablet (10 mg total) by mouth 3 (three) times daily as needed. Muscle spasms 30 each 11  . benzonatate (TESSALON) 200 MG capsule Take 200 mg by mouth 3 (three) times daily as needed for cough.    . carbamazepine (TEGRETOL-XR) 200 MG 12 hr tablet Take 1 tablet (200 mg total) by mouth 2 (two) times daily. 60  tablet 6  . cholecalciferol (VITAMIN D) 1000 UNITS tablet Take 1,000 Units by mouth daily.    . Dulaglutide (TRULICITY) 4.03 KV/4.2VZ SOPN Inject 0.75 mg into the skin once a week. 4 pen 1  . gabapentin (NEURONTIN) 100 MG capsule Take 1 capsule (100 mg total) by mouth 3 (three) times daily. 90 capsule 12  . GuaiFENesin (ROBAFEN PO) Take by mouth. 1 teaspoon at night as needed for cough    . hydrochlorothiazide (HYDRODIURIL) 25 MG tablet Take 12.5 mg by mouth daily.    Marland Kitchen HYDROcodone-acetaminophen (NORCO) 7.5-325 MG tablet Take 1  tablet by mouth every 6 (six) hours as needed for moderate pain.    . Insulin Glargine (BASAGLAR KWIKPEN) 100 UNIT/ML SOPN Inject 0.3 mLs (30 Units total) into the skin at bedtime. 5 pen 11  . Insulin NPH Human, Isophane, (NOVOLIN N [place]) Inject 20 Units into the skin at bedtime.     . Insulin Pen Needle (CAREFINE PEN NEEDLES) 32G X 4 MM MISC Use 1x a day 100 each 3  . metoprolol (LOPRESSOR) 50 MG tablet Take 25 mg by mouth 2 (two) times daily.  0  . pioglitazone (ACTOS) 15 MG tablet Take 15 mg by mouth daily.     Marland Kitchen PRESCRIPTION MEDICATION Patient receives a Steroid injection in the neck once a year with Dr. Mina Marble at [facility] pt is unable to recall the exact name and i have been unable to verify name from the Dr's office. Patient also states " it shoots up my blood sugars really high, but i still get it because i can't take anything else for my neck pain"    . promethazine (PHENERGAN) 25 MG suppository Place 25 mg rectally every 6 (six) hours as needed for nausea or vomiting.     Current Facility-Administered Medications on File Prior to Visit  Medication Dose Route Frequency Provider Last Rate Last Dose  . methylPREDNISolone sodium succinate (SOLU-MEDROL) 130 mg in sodium chloride 0.9 % 50 mL IVPB  130 mg Intravenous Once Jerline Pain, MD       Allergies  Allergen Reactions  . Contrast Media [Iodinated Diagnostic Agents] Hives  . Augmentin [Amoxicillin-Pot  Clavulanate] Nausea And Vomiting    Nausea, vomiting, headache, dizziness   pt states she can take other Penicillins just not augmentin  . Erythromycin   . Metformin And Related Diarrhea and Nausea And Vomiting    Violently ill for 2 yrs  . Nsaids Other (See Comments)    Stomach problems  . Prednisone Other (See Comments)    Abnormal blood sugars..  . Statins Other (See Comments)    Muscle pain/ weekness  . Sulfa Antibiotics Other (See Comments)    blackout  . Invokana [Canagliflozin] Rash and Other (See Comments)    Dehydration   Family History  Adopted: Yes  Problem Relation Age of Onset  . Failure to thrive Mother   . Heart failure Father   . Neuropathy Neg Hx    PE: BP 114/78   Pulse 76   SpO2 97%  Refused being weighed. Wt Readings from Last 3 Encounters:  01/06/17 184 lb (83.5 kg)  06/04/16 177 lb (80.3 kg)  12/19/11 174 lb (78.9 kg)   Constitutional: overweight, in NAD Eyes: PERRLA, EOMI, no exophthalmos ENT: moist mucous membranes, no thyromegaly, no cervical lymphadenopathy Cardiovascular: RRR, No MRG, + B LE edema Respiratory: CTA B Gastrointestinal: abdomen soft, NT, ND, BS+ Musculoskeletal: no deformities, strength intact in all 4 Skin: moist, warm, no rashes Neurological: no tremor with outstretched hands, DTR normal in all 4   ASSESSMENT: 1. DM2, insulin-dependent, uncontrolled, without long-term complications, but with hyperglycemia  + hypertensive retinopathy  2. HL  PLAN:  1. Patient with long-standing, uncontrolled, type 2 diabetes, on oral antidiabetic regimen, GLP-1 receptor agonist and basal insulin.  At last visit, she was on intermediate acting insulin NPH, once a day, which we switched to [facility] for 24-hour coverage.  I suggested Trulicity low-dose.  We did discuss at last visit about the possibility of stopping Actos if her sugars improved on the current regimen -  At this visit, she tells me that she unfortunately could not follow the  regimen that I suggested at last visit.  She could not tolerate Basaglar due to back pain and also congestion.  Both of these resolved after stopping Basaglar so she went back to her previous dose of NPH, 25 units once a day.  I also suggested Trulicity at last visit, but she could not afford this. - As sugars are still high, we discussed about increasing NPH to twice a day, since this is an intermediate acting insulin, which needs to be administered twice a day to cover the entire 24-hour.  However, since  her sugars are very high after meals, will need to add regular insulin.  We discussed about how to mix up the 2 insulins and to make sure that she injected a homogeneous solution. - I suggested to:  Patient Instructions  Please continue: - Actos 15 mg daily in am.  Please change: Insulin Before breakfast Before lunch Before dinner  Regular 8-10  8-10  NPH 20  25  Please inject the insulin 30 min before meals.  Please return in 1.5 months with your sugar log.   -Reviewed together her previous HbA1c, which was high, at 10.9%;we will recheck it at next visit.  - continue checking sugars at different times of the day - check 1x a day, rotating checks - advised for yearly eye exams >> she is UTD - Return to clinic in 1.5 mo with sugar log    2. HL -Reviewed latest lipid panel from 12/2016: LDL higher than goal -She is not on a statin due to previous side effects with several of them -Improving diabetes control will certainly help her cholesterol levels, also  Philemon Kingdom, MD PhD [facility] Endocrinology

## 2017-05-12 NOTE — Patient Instructions (Addendum)
Please continue: - Actos 15 mg daily in am.  Please change: Insulin Before breakfast Before lunch Before dinner  Regular 8-10  8-10  NPH 20  25  Please inject the insulin 30 min before meals.  Please return in 1.5 months with your sugar log.

## 2017-05-29 DIAGNOSIS — M542 Cervicalgia: Secondary | ICD-10-CM | POA: Diagnosis not present

## 2017-06-03 DIAGNOSIS — M4722 Other spondylosis with radiculopathy, cervical region: Secondary | ICD-10-CM | POA: Diagnosis not present

## 2017-06-09 DIAGNOSIS — M4722 Other spondylosis with radiculopathy, cervical region: Secondary | ICD-10-CM | POA: Diagnosis not present

## 2017-06-12 DIAGNOSIS — M4722 Other spondylosis with radiculopathy, cervical region: Secondary | ICD-10-CM | POA: Diagnosis not present

## 2017-06-16 DIAGNOSIS — M4722 Other spondylosis with radiculopathy, cervical region: Secondary | ICD-10-CM | POA: Diagnosis not present

## 2017-06-19 DIAGNOSIS — M5442 Lumbago with sciatica, left side: Secondary | ICD-10-CM | POA: Diagnosis not present

## 2017-06-19 DIAGNOSIS — M4722 Other spondylosis with radiculopathy, cervical region: Secondary | ICD-10-CM | POA: Diagnosis not present

## 2017-06-23 DIAGNOSIS — M4722 Other spondylosis with radiculopathy, cervical region: Secondary | ICD-10-CM | POA: Diagnosis not present

## 2017-06-25 DIAGNOSIS — I10 Essential (primary) hypertension: Secondary | ICD-10-CM | POA: Diagnosis not present

## 2017-06-25 DIAGNOSIS — M5030 Other cervical disc degeneration, unspecified cervical region: Secondary | ICD-10-CM | POA: Diagnosis not present

## 2017-06-26 DIAGNOSIS — M4722 Other spondylosis with radiculopathy, cervical region: Secondary | ICD-10-CM | POA: Diagnosis not present

## 2017-07-02 DIAGNOSIS — M545 Low back pain: Secondary | ICD-10-CM | POA: Diagnosis not present

## 2017-07-07 ENCOUNTER — Ambulatory Visit (INDEPENDENT_AMBULATORY_CARE_PROVIDER_SITE_OTHER): Payer: Medicare Other | Admitting: Neurology

## 2017-07-07 ENCOUNTER — Encounter: Payer: Self-pay | Admitting: Neurology

## 2017-07-07 ENCOUNTER — Other Ambulatory Visit: Payer: Self-pay | Admitting: Neurology

## 2017-07-07 VITALS — BP 148/81 | HR 79 | Ht 65.0 in

## 2017-07-07 DIAGNOSIS — M5412 Radiculopathy, cervical region: Secondary | ICD-10-CM | POA: Diagnosis not present

## 2017-07-07 DIAGNOSIS — G500 Trigeminal neuralgia: Secondary | ICD-10-CM | POA: Diagnosis not present

## 2017-07-07 MED ORDER — GABAPENTIN 100 MG PO CAPS: 100.0000 mg | ORAL_CAPSULE | Freq: Three times a day (TID) | ORAL | 12 refills | Status: DC

## 2017-07-07 NOTE — Progress Notes (Signed)
[facility]    Provider:  Dr Jaynee Eagles Referring Provider: Leighton Ruff, MD Primary Care Physician:  Leighton Ruff, MD  CC:  Trigeminal neuralgia  Interval history 07/07/2017: She has a lot of neck, arm and shoulder pain and radiation down th left arm and muscle spasms. Reviewed MRI c-spine images with patient which shows a new left C7 impingement, she had a shot2 years ago, symptoms started easter weekend, she had severe left arm spasms likely due to C7 nerve root impimgement.   Interval history 01/06/2017: Trigeminal neuralgia. 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 She can't tolerate the neurontin. She declines MRI trigeminal nerve. Will try tegretol. Also Discussed [facility] referral for radiofrequency ablation. Will check labs today, Baclofen helps with her neck pain and stiffness but not the trigeminal neuralgia. She has a lot of muscle spasms.    HPI:  [name] is a 67 y.o. female here as a referral from Dr. Ernesto Rutherford for facial pain. She has a past medical history of fracture of the nasal bones, diabetes, hyperlipidemia, hypertension, fibromyalgia, migraine headaches, hypertensive retinopathy. She fell October 5th 2017, thy were at [facility] and she was walking [place] the [place], fell over a root and slammed into the hard sand, she fractured her nose. She had numbness for months and then she started feeling back and now she has tingling feeling, buzzing. She has pain and numbness in the right cheek area, sometimes hard to talk in the morning and she feels her face is pulling and the corner of her eye gets twitchy. Her eye twitches and closes and can cause vision changes. The twitching is often. She ha some right ptosis. Twitching is agravating and can occur on and off all day. Denies weakness but there is asymmetry, difficult to talk. The facial pain can be moderate in severity, the facial pain is continuous. No other focal neurologic  deficits, associated symptoms, inciting events or modifiable factors.  Reviewed notes, labs and imaging from outside physicians, which showed:  Reviewed notes from Dr. Ernesto Rutherford. Patient was at [facility] felt tripped over a root and fell flat on her face and fracture her nose. She had a nondisplaced nasal bone fracture seen in radiology. Swelling is much improved but still continuing to have pain around the right cheek and some numbness in the right cheek. Evaluation showed ears to be clear, tympanic membranes look excellent, her nose is undisplaced but is tender, septum is also straight without evidence of hematoma,, dentition appears normal, trachea clear. Diagnosed with a nondisplaced nasal fracture with trauma to the right infraorbital nerve with numbness but no evidence of fracture or displacement.   Reviewed labs CMP in June 2017 showed elevated glucose, BUN and creatinine: 189, 23, 1.01 respectively.  Review of Systems: Patient complains of symptoms per HPI as well as the following symptoms: cold intolerance, aching muscles, neck pain, muscle stiffness. Pertinent negatives per HPI. All others negative.     Social History   Socioeconomic History  . Marital status: Single    Spouse name: Not on file  . Number of children: 0  . Years of education: Some college  . Highest education level: Not on file  Occupational History  . 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 Financial resource strain: Not on file  . Food insecurity:    Worry: Not on file    Inability: Not on file  . Transportation needs:    Medical:  Not on file    Non-medical: Not on file  Tobacco Use  . Smoking status: Never Smoker  . Smokeless tobacco: Never Used  Substance and Sexual Activity  . Alcohol use: No    Comment: 1/2 glass yearly  . Drug use: No  . Sexual activity: Not on file  Lifestyle  . Physical activity:    Days per week: Not on file    Minutes per session:  Not on file  . Stress: Not on file  Relationships  . Social connections:    Talks on phone: Not on file    Gets together: Not on file    Attends religious service: Not on file    Active member of club or organization: Not on file    Attends meetings of clubs or organizations: Not on file    Relationship status: Not on file  . Intimate partner violence:    Fear of current or ex partner: Not on file    Emotionally abused: Not on file    Physically abused: Not on file    Forced sexual activity: Not on file  Other Topics Concern  . Not on file  Social History Narrative   Lives alone w/ her 3 cats   Right-handed   Caffeine: 2 cups coffee/tea per day    Family History  Adopted: Yes  Problem Relation Age of Onset  . Failure to thrive Mother   . Heart failure Father   . Neuropathy Neg Hx     Past Medical History:  Diagnosis Date  . Diabetes mellitus   . Diverticulitis   . Fibromyalgia   . Hypercholesteremia   . Hypertension   . IBS (irritable bowel syndrome)   . Migraines   . Nasal fracture   . Stein-Leventhal ovaries     Past Surgical History:  Procedure Laterality Date  . CHOLECYSTECTOMY  1998  . KNEE SURGERY Right 2017  . LEFT HEART CATHETERIZATION WITH CORONARY ANGIOGRAM N/A 12/19/2011   Procedure: LEFT HEART CATHETERIZATION WITH CORONARY ANGIOGRAM;  Surgeon: Candee Furbish, MD;  Location: [facility] CATH LAB;  Service: Cardiovascular;  Laterality: N/A;  . SHOULDER SURGERY    . TONSILLECTOMY  1959    Current Outpatient Medications  Medication Sig Dispense Refill  . acetaminophen-codeine (TYLENOL #3) 300-30 MG per tablet Take 1 tablet by mouth every 4 (four) hours as needed. pain    . aspirin EC 81 MG tablet Take 81 mg by mouth every evening.    . cholecalciferol (VITAMIN D) 1000 UNITS tablet Take 1,000 Units by mouth daily.    . hydrochlorothiazide (HYDRODIURIL) 25 MG tablet Take 12.5 mg by mouth daily.    Marland Kitchen HYDROcodone-acetaminophen (NORCO) 7.5-325 MG tablet Take 1  tablet by mouth every 6 (six) hours as needed for moderate pain.    Marland Kitchen insulin NPH Human (NOVOLIN N) 100 UNIT/ML injection Inject 20 units in am and 25 units in pm 10 mL 11  . insulin regular (NOVOLIN R RELION) 100 units/mL injection Inject 0.08-0.1 mLs (8-10 Units total) into the skin 2 (two) times daily before a meal. 10 mL 11  . metoprolol (LOPRESSOR) 50 MG tablet Take 25 mg by mouth 2 (two) times daily.  0  . pioglitazone (ACTOS) 15 MG tablet Take 15 mg by mouth daily.     Marland Kitchen PRESCRIPTION MEDICATION Patient receives a Steroid injection in the neck once a year with Dr. Mina Marble at [facility] pt is unable to recall the exact name and i have been unable to  verify name from the Dr's office. Patient also states " it shoots up my blood sugars really high, but i still get it because i can't take anything else for my neck pain"    . promethazine (PHENADOZ) 25 MG suppository Place 25 mg rectally once.    Marland Kitchen tiZANidine (ZANAFLEX) 4 MG tablet   0  . carbamazepine (TEGRETOL-XR) 200 MG 12 hr tablet Take 1 tablet (200 mg total) by mouth 2 (two) times daily. (Patient not taking: Reported on 05/12/2017) 60 tablet 6  . gabapentin (NEURONTIN) 100 MG capsule Take 1 capsule (100 mg total) by mouth 3 (three) times daily. 90 capsule 12   No current facility-administered medications for this visit.    Facility-Administered Medications Ordered in Other Visits  Medication Dose Route Frequency Provider Last Rate Last Dose  . methylPREDNISolone sodium succinate (SOLU-MEDROL) 130 mg in sodium chloride 0.9 % 50 mL IVPB  130 mg Intravenous Once Jerline Pain, MD        Allergies as of 07/07/2017 - Review Complete 07/07/2017  Allergen Reaction Noted  . Contrast media [iodinated diagnostic agents] Hives 05/12/2011  . Augmentin [amoxicillin-pot clavulanate] Nausea And Vomiting 05/12/2011  . Basaglar kwikpen [insulin glargine]  05/12/2017  . Erythromycin  06/04/2016  . Metformin and related Diarrhea and Nausea And  Vomiting 06/04/2016  . Nsaids Other (See Comments) 07/07/2015  . Prednisone Other (See Comments) 05/12/2011  . Statins Other (See Comments) 07/07/2015  . Sulfa antibiotics Other (See Comments) 09/09/2013  . Invokana [canagliflozin] Rash and Other (See Comments) 06/04/2016  . Tramadol Rash 07/07/2017    Vitals: BP (!) 148/81   Pulse 79   Ht 5\' 5"  (1.651 m)   BMI 30.62 kg/m  Last Weight:  Wt Readings from Last 1 Encounters:  01/06/17 184 lb (83.5 kg)   Last Height:   Ht Readings from Last 1 Encounters:  07/07/17 5\' 5"  (1.651 m)     Physical exam: Exam: Gen: NAD, conversant, well nourised, overweight, well groomed                     CV: RRR, no MRG. No Carotid Bruits. No peripheral edema, warm, nontender Eyes: Conjunctivae clear without exudates or hemorrhage  Neuro: Detailed Neurologic Exam  Speech:    Speech is normal; fluent and spontaneous with normal comprehension.  Cognition:    The patient is oriented to person, place, and time;     recent and remote memory intact;     language fluent;     normal attention, concentration,     fund of knowledge Cranial Nerves:    The pupils are equal, round, and reactive to light. The fundi are normal and spontaneous venous pulsations are present. Visual fields are full to finger confrontation. Extraocular movements are intact. Trigeminal sensation is impaired on the left and the muscles of mastication are normal. Right ptosis, mild right NL flattening. . The palate elevates in the midline. Hearing intact. Voice is normal. Shoulder shrug is normal. The tongue has normal motion without fasciculations.   Coordination:    No dysmetria  Gait:    Not ataxic  Motor Observation:    No asymmetry, no atrophy, and no involuntary movements noted. Tone:    Normal muscle tone.    Posture:    Posture is normal. normal erect    Strength: Left proximal arm weakness especially triceps and wrist flexion may be due to pain or c7  radic.      Sensation: intact to  LT     Reflex Exam:  DTR's: Hypo lowers (surgical). Deep tendon reflexes in the upper extremities are normal bilaterally.   Toes:    The toes are downgoing bilaterally.   Clonus:    Clonus is absent.       Assessment/Plan:  This is a lovely 67 year old female with facial pain after trauma to the face in the distribution of trigeminal nerve maxillary (V2) specifically infraorbital branch. Had a long discussion with patient and showed her images of the distribution of the trigeminal nerve. 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 Treatment of trigeminal neuralgia includes medications, nerve blocks, Botox injections, radiofrequency nerve ablation.  Also has C7 radic left.    Trigeminal neuralgia: Take gabapentin and tizanidine. - Will refer to [facility] for trigeminal neuralgia radiofrequency ablation. Has failed gabapentin, baclofen, tegretol cannot tolerate any of these medications does not want to try any other meds Left cervical radic: Went to PT, taking tizanidine and gabapentin MRI of the face trigeminal protocol, she declined Could not tolerate Gabapentin higher than 100mg  tid, will give for radic and trigemin neuralgia Recommend PT after Epidural Steroid Injections for left C7 radic   Cc: Dr. Ernesto Rutherford and Dr. Drema Dallas   Orders Placed This Encounter  Procedures  . Ambulatory referral to Interventional Radiology    Sarina Ill, MD  [facility] [address] [place], [place] [address]  Phone [phone] Fax [phone]  A total of 40 minutes was spent face-to-face with this patient. Over half this time was spent on counseling patient on the Trigeminal neuralgia diagnosis and different diagnostic and therapeutic options available.

## 2017-07-07 NOTE — Patient Instructions (Signed)
Gabapentin capsules or tablets What is this medicine? GABAPENTIN (GA ba pen tin) is used to control partial seizures in adults with epilepsy. It is also used to treat certain types of nerve pain. This medicine may be used for other purposes; ask your health care provider or pharmacist if you have questions. COMMON BRAND NAME(S): Active-PAC with Gabapentin, Gabarone, Neurontin What should I tell my health care provider before I take this medicine? They need to know if you have any of these conditions: -kidney disease -suicidal thoughts, plans, or attempt; a previous suicide attempt by you or a family member -an unusual or allergic reaction to gabapentin, other medicines, foods, dyes, or preservatives -pregnant or trying to get pregnant -breast-feeding How should I use this medicine? Take this medicine by mouth with a glass of water. Follow the directions on the prescription label. You can take it with or without food. If it upsets your stomach, take it with food.Take your medicine at regular intervals. Do not take it more often than directed. Do not stop taking except on your doctor's advice. If you are directed to break the 600 or 800 mg tablets in half as part of your dose, the extra half tablet should be used for the next dose. If you have not used the extra half tablet within 28 days, it should be thrown away. A special MedGuide will be given to you by the pharmacist with each prescription and refill. Be sure to read this information carefully each time. Talk to your pediatrician regarding the use of this medicine in children. Special care may be needed. Overdosage: If you think you have taken too much of this medicine contact a poison control center or emergency room at once. NOTE: This medicine is only for you. Do not share this medicine with others. What if I miss a dose? If you miss a dose, take it as soon as you can. If it is almost time for your next dose, take only that dose. Do not  take double or extra doses. What may interact with this medicine? Do not take this medicine with any of the following medications: -other gabapentin products This medicine may also interact with the following medications: -alcohol -antacids -antihistamines for allergy, cough and cold -certain medicines for anxiety or sleep -certain medicines for depression or psychotic disturbances -homatropine; hydrocodone -naproxen -narcotic medicines (opiates) for pain -phenothiazines like chlorpromazine, mesoridazine, prochlorperazine, thioridazine This list may not describe all possible interactions. Give your health care provider a list of all the medicines, herbs, non-prescription drugs, or dietary supplements you use. Also tell them if you smoke, drink alcohol, or use illegal drugs. Some items may interact with your medicine. What should I watch for while using this medicine? Visit your doctor or health care professional for regular checks on your progress. You may want to keep a record at home of how you feel your condition is responding to treatment. You may want to share this information with your doctor or health care professional at each visit. You should contact your doctor or health care professional if your seizures get worse or if you have any new types of seizures. Do not stop taking this medicine or any of your seizure medicines unless instructed by your doctor or health care professional. Stopping your medicine suddenly can increase your seizures or their severity. Wear a medical identification bracelet or chain if you are taking this medicine for seizures, and carry a card that lists all your medications. You may get drowsy, dizzy,   or have blurred vision. Do not drive, use machinery, or do anything that needs mental alertness until you know how this medicine affects you. To reduce dizzy or fainting spells, do not sit or stand up quickly, especially if you are an older patient. Alcohol can  increase drowsiness and dizziness. Avoid alcoholic drinks. Your mouth may get dry. Chewing sugarless gum or sucking hard candy, and drinking plenty of water will help. The use of this medicine may increase the chance of suicidal thoughts or actions. Pay special attention to how you are responding while on this medicine. Any worsening of mood, or thoughts of suicide or dying should be reported to your health care professional right away. Women who become pregnant while using this medicine may enroll in the [place] Antiepileptic Drug Pregnancy Registry by calling 1-888-233-2334. This registry collects information about the safety of antiepileptic drug use during pregnancy. What side effects may I notice from receiving this medicine? Side effects that you should report to your doctor or health care professional as soon as possible: -allergic reactions like skin rash, itching or hives, swelling of the face, lips, or tongue -worsening of mood, thoughts or actions of suicide or dying Side effects that usually do not require medical attention (report to your doctor or health care professional if they continue or are bothersome): -constipation -difficulty walking or controlling muscle movements -dizziness -nausea -slurred speech -tiredness -tremors -weight gain This list may not describe all possible side effects. Call your doctor for medical advice about side effects. You may report side effects to FDA at 1-800-FDA-1088. Where should I keep my medicine? Keep out of reach of children. This medicine may cause accidental overdose and death if it taken by other adults, children, or pets. Mix any unused medicine with a substance like cat litter or coffee grounds. Then throw the medicine away in a sealed container like a sealed bag or a coffee can with a lid. Do not use the medicine after the expiration date. Store at room temperature between 15 and 30 degrees C (59 and 86 degrees F). NOTE: This  sheet is a summary. It may not cover all possible information. If you have questions about this medicine, talk to your doctor, pharmacist, or health care provider.  2018 Elsevier/Gold Standard (2013-03-19 15:26:50)  

## 2017-07-16 DIAGNOSIS — M5412 Radiculopathy, cervical region: Secondary | ICD-10-CM | POA: Diagnosis not present

## 2017-07-18 ENCOUNTER — Ambulatory Visit: Payer: Self-pay | Admitting: Internal Medicine

## 2017-07-24 DIAGNOSIS — M47892 Other spondylosis, cervical region: Secondary | ICD-10-CM | POA: Diagnosis not present

## 2017-07-24 DIAGNOSIS — M542 Cervicalgia: Secondary | ICD-10-CM | POA: Diagnosis not present

## 2017-08-01 ENCOUNTER — Encounter: Payer: Self-pay | Admitting: Neurology

## 2017-08-11 ENCOUNTER — Telehealth: Payer: Self-pay | Admitting: Emergency Medicine

## 2017-08-11 ENCOUNTER — Other Ambulatory Visit: Payer: Self-pay

## 2017-08-11 DIAGNOSIS — M5412 Radiculopathy, cervical region: Secondary | ICD-10-CM | POA: Diagnosis not present

## 2017-08-11 MED ORDER — GLUCOSE BLOOD VI STRP: ORAL_STRIP | 12 refills | Status: DC

## 2017-08-11 MED ORDER — ACCU-CHEK MULTICLIX LANCETS MISC: 12 refills | Status: DC

## 2017-08-11 NOTE — Telephone Encounter (Signed)
Sent!

## 2017-08-11 NOTE — Telephone Encounter (Signed)
Pt called and stated she needs a refill on her Accu check lancets and Accu check smart view test strips. The previous provider that filled these stated her Endocrinologist now needs to refill these for her instead. [facility]. Thanks.

## 2017-08-15 ENCOUNTER — Other Ambulatory Visit: Payer: Self-pay

## 2017-08-15 MED ORDER — ACCU-CHEK SMARTVIEW VI STRP: ORAL_STRIP | 3 refills | Status: DC

## 2017-08-26 DIAGNOSIS — R51 Headache: Secondary | ICD-10-CM | POA: Diagnosis not present

## 2017-09-01 ENCOUNTER — Telehealth: Payer: Self-pay

## 2017-09-01 MED ORDER — PIOGLITAZONE HCL 15 MG PO TABS: 15.0000 mg | ORAL_TABLET | Freq: Every day | ORAL | 0 refills | Status: DC

## 2017-09-01 NOTE — Telephone Encounter (Signed)
Sent, per chart note she takes 15mg  daily.

## 2017-09-01 NOTE — Telephone Encounter (Signed)
Patient calling to get a refill on pioglitazone because her PCP will no longer fill for her

## 2017-09-30 DIAGNOSIS — M79641 Pain in right hand: Secondary | ICD-10-CM | POA: Diagnosis not present

## 2017-10-14 DIAGNOSIS — S0231XS Fracture of orbital floor, right side, sequela: Secondary | ICD-10-CM | POA: Diagnosis not present

## 2017-10-14 DIAGNOSIS — R51 Headache: Secondary | ICD-10-CM | POA: Diagnosis not present

## 2017-10-14 DIAGNOSIS — G508 Other disorders of trigeminal nerve: Secondary | ICD-10-CM | POA: Diagnosis not present

## 2017-10-14 DIAGNOSIS — G501 Atypical facial pain: Secondary | ICD-10-CM | POA: Diagnosis not present

## 2017-10-16 ENCOUNTER — Encounter: Payer: Self-pay | Admitting: Internal Medicine

## 2017-10-16 ENCOUNTER — Ambulatory Visit (INDEPENDENT_AMBULATORY_CARE_PROVIDER_SITE_OTHER): Payer: Medicare Other | Admitting: Internal Medicine

## 2017-10-16 VITALS — BP 142/90 | HR 80 | Ht 65.0 in | Wt 197.0 lb

## 2017-10-16 DIAGNOSIS — Z794 Long term (current) use of insulin: Secondary | ICD-10-CM | POA: Diagnosis not present

## 2017-10-16 DIAGNOSIS — R635 Abnormal weight gain: Secondary | ICD-10-CM

## 2017-10-16 DIAGNOSIS — E1165 Type 2 diabetes mellitus with hyperglycemia: Secondary | ICD-10-CM | POA: Diagnosis not present

## 2017-10-16 DIAGNOSIS — E785 Hyperlipidemia, unspecified: Secondary | ICD-10-CM

## 2017-10-16 LAB — POCT GLYCOSYLATED HEMOGLOBIN (HGB A1C): Hemoglobin A1C: 8 % — AB (ref 4.0–5.6)

## 2017-10-16 MED ORDER — INSULIN NPH (HUMAN) (ISOPHANE) 100 UNIT/ML ~~LOC~~ SUSP: SUBCUTANEOUS | 11 refills | Status: DC

## 2017-10-16 NOTE — Addendum Note (Signed)
Addended by: Cardell Peach I on: 10/16/2017 04:05 PM   Modules accepted: Orders

## 2017-10-16 NOTE — Progress Notes (Signed)
Patient ID: [name], female   DOB: November 25, 1950, 67 y.o.   MRN: [id number]   HPI: [name] is a 67 y.o.-year-old female, returning for f/u for DM2, dx in 2006-2007, insulin-dependent since 2017, uncontrolled, without long term complications.  She saw Dr. Buddy Duty in the past. Last visit with me 5 months ago.  She has neck pain >> had a steroid inj in neck in 07/2017. This week >> she was on Prednisone for an MRI.  Last hemoglobin A1c was: Lab Results  Component Value Date   HGBA1C 10.9 03/24/2017  12/09/2016: HbA1c 10.7% 07/10/2016: HbA1c 9.6%  Pt was on a regimen of: - NPH 25 units at bedtime - Actos 15 mg in a.m.  She is currently on: - Actos 15 mg daily in am. - Also: Insulin Before breakfast Before lunch Before dinner  Regular 8  8  NPH 20  22  Please inject the insulin 30 min before meals.  She could not afford Trulicity.  She has tried:  Metformin >> stomach cramps, diarrhea  Invokana >> dry mouth, rash  Basaglar >> back muscle spasms, congestion  Pt checks her sugars once a day: - am: 168-188 >> 103-171, 239 (midnight snack) >> 87-160, 198 (forgot insulin the night before) - 2h after b'fast: n/c >> 273, 283 >>140-205, 244 (steroids) - before lunch: n/c >> 61, 95 - 2h after lunch: n/c  - before dinner: n/c >> 182 >> 125, 187 - 2h after dinner: n/c >> 129, 189 - bedtime: n/c >> 95, 127-145 - nighttime: n/c >> 66 Lowest sugar was 128 >> 103 >> 61; it is unclear at which level she has hypoglycemia awareness Highest sugar was 263 >> 273 >> 424 (steroids)  Glucometer: Accu-Chek  Pt's meals are: - Breakfast: oatmeal, Kashi cereal, English muffin, whole wheat bread - Lunch: sandwich, salad - Dinner: (late, 8 pm): veggies, meat or pasta or eggs + toast (largest) - Snacks: none + Coke 0, tea + Splenda  -No CKD, last BUN/creatinine:  Lab Results  Component Value Date   BUN 14 01/06/2017   BUN 23 (H) 07/07/2015   CREATININE 0.81 01/06/2017   CREATININE  1.01 (H) 07/07/2015  07/10/2016: ACR 5.5  -+ HL; last set of lipids: 12/10/2016: 226/113/57/147. No results found for: CHOL, HDL, LDLCALC, LDLDIRECT, TRIG, CHOLHDL  She is not on a statin.  Lovastatin and atorvastatin caused her muscle aches.  She could not tolerate rosuvastatin, also.  - last eye exam was in 04/2017: No DR  - Denies numbness and tingling in her feet.   She is on Neurontin 100 mg 3 times a day for trigeminal neuralgia.  On ASA 81.  Unclear FH of DM - pt is adopted.  She also has a history of HTN, fibromyalgia, vitamin D deficiency, PCOS, fatty liver, kidney stones, trigeminal neuralgia, migraines.  ROS: Constitutional: + weight gain/no weight loss, no fatigue, no subjective hyperthermia, + occasional chills Eyes: no blurry vision, no xerophthalmia ENT: no sore throat, no nodules palpated in throat, no dysphagia, no odynophagia, no hoarseness Cardiovascular: no CP/no SOB/no palpitations/+ leg swelling Respiratory: no cough/no SOB/no wheezing Gastrointestinal: no N/no V/no D/no C/no acid reflux Musculoskeletal: + Muscle aches/+ joint aches Skin: no rashes, no hair loss Neurological: no tremors/no numbness/no tingling/no dizziness  I reviewed pt's medications, allergies, PMH, social hx, family hx, and changes were documented in the history of present illness. Otherwise, unchanged from my initial visit note.  Past Medical History:  Diagnosis Date  . Diabetes  mellitus   . Diverticulitis   . Fibromyalgia   . Hypercholesteremia   . Hypertension   . IBS (irritable bowel syndrome)   . Migraines   . Nasal fracture   . Stein-Leventhal ovaries    Past Surgical History:  Procedure Laterality Date  . CHOLECYSTECTOMY  1998  . KNEE SURGERY Right 2017  . LEFT HEART CATHETERIZATION WITH CORONARY ANGIOGRAM N/A 12/19/2011   Procedure: LEFT HEART CATHETERIZATION WITH CORONARY ANGIOGRAM;  Surgeon: Candee Furbish, MD;  Location: [facility] CATH LAB;  Service: Cardiovascular;  Laterality:  N/A;  . SHOULDER SURGERY    . TONSILLECTOMY  1959   Social History   Socioeconomic History  . Marital status: Single    Spouse name: Not on file  . Number of children: 0  . Years of education: Some college  . Highest education level: Not on file  Occupational History  . 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 Financial resource strain: Not on file  . Food insecurity:    Worry: Not on file    Inability: Not on file  . Transportation needs:    Medical: Not on file    Non-medical: Not on file  Tobacco Use  . Smoking status: Never Smoker  . Smokeless tobacco: Never Used  Substance and Sexual Activity  . Alcohol use: No    Comment: 1/2 glass yearly  . Drug use: No  . Sexual activity: Not on file  Lifestyle  . Physical activity:    Days per week: Not on file    Minutes per session: Not on file  . Stress: Not on file  Relationships  . Social connections:    Talks on phone: Not on file    Gets together: Not on file    Attends religious service: Not on file    Active member of club or organization: Not on file    Attends meetings of clubs or organizations: Not on file    Relationship status: Not on file  . Intimate partner violence:    Fear of current or ex partner: Not on file    Emotionally abused: Not on file    Physically abused: Not on file    Forced sexual activity: Not on file  Other Topics Concern  . Not on file  Social History Narrative   Lives alone w/ her 3 cats   Right-handed   Caffeine: 2 cups coffee/tea per day   Current Outpatient Medications on File Prior to Visit  Medication Sig Dispense Refill  . ACCU-CHEK SMARTVIEW test strip Use as instructed to test twice daily DX E11.65 200 each 3  . acetaminophen-codeine (TYLENOL #3) 300-30 MG per tablet Take 1 tablet by mouth every 4 (four) hours as needed. pain    . aspirin EC 81 MG tablet Take 81 mg by mouth every evening.    . cholecalciferol (VITAMIN D) 1000 UNITS tablet  Take 1,000 Units by mouth daily.    Marland Kitchen gabapentin (NEURONTIN) 100 MG capsule Take 1 capsule (100 mg total) by mouth 3 (three) times daily. 90 capsule 12  . hydrochlorothiazide (HYDRODIURIL) 25 MG tablet Take 12.5 mg by mouth daily.    Marland Kitchen HYDROcodone-acetaminophen (NORCO) 7.5-325 MG tablet Take 1 tablet by mouth every 6 (six) hours as needed for moderate pain.    Marland Kitchen insulin NPH Human (NOVOLIN N) 100 UNIT/ML injection Inject 20 units in am and 25 units in pm 10 mL 11  . insulin regular (NOVOLIN R RELION) 100  units/mL injection Inject 0.08-0.1 mLs (8-10 Units total) into the skin 2 (two) times daily before a meal. 10 mL 11  . Lancets (ACCU-CHEK MULTICLIX) lancets Use as instructed to test once daily DX E11.65 100 each 12  . metoprolol (LOPRESSOR) 50 MG tablet Take 25 mg by mouth 2 (two) times daily.  0  . pioglitazone (ACTOS) 15 MG tablet Take 1 tablet (15 mg total) by mouth daily. 90 tablet 0  . PRESCRIPTION MEDICATION Patient receives a Steroid injection in the neck once a year with Dr. Mina Marble at [facility] pt is unable to recall the exact name and i have been unable to verify name from the Dr's office. Patient also states " it shoots up my blood sugars really high, but i still get it because i can't take anything else for my neck pain"    . promethazine (PHENADOZ) 25 MG suppository Place 25 mg rectally once.    Marland Kitchen tiZANidine (ZANAFLEX) 4 MG tablet   0   Current Facility-Administered Medications on File Prior to Visit  Medication Dose Route Frequency Provider Last Rate Last Dose  . methylPREDNISolone sodium succinate (SOLU-MEDROL) 130 mg in sodium chloride 0.9 % 50 mL IVPB  130 mg Intravenous Once Jerline Pain, MD       Allergies  Allergen Reactions  . Contrast Media [Iodinated Diagnostic Agents] Hives  . Augmentin [Amoxicillin-Pot Clavulanate] Nausea And Vomiting    Nausea, vomiting, headache, dizziness   pt states she can take other Penicillins just not augmentin  . Basaglar Kwikpen  [Insulin [place]   . Erythromycin   . Metformin And Related Diarrhea and Nausea And Vomiting    Violently ill for 2 yrs  . Nsaids Other (See Comments)    Stomach problems  . Prednisone Other (See Comments)    Abnormal blood sugars..  . Statins Other (See Comments)    Muscle pain/ weekness  . Sulfa Antibiotics Other (See Comments)    blackout  . Invokana [Canagliflozin] Rash and Other (See Comments)    Dehydration  . Tramadol Rash   Family History  Adopted: Yes  Problem Relation Age of Onset  . Failure to thrive Mother   . Heart failure Father   . Neuropathy Neg Hx    PE: BP (!) 142/90   Pulse 80   Ht 5\' 5"  (1.651 m)   Wt 197 lb (89.4 kg)   SpO2 99%   BMI 32.78 kg/m  Refused being weighed. Wt Readings from Last 3 Encounters:  10/16/17 197 lb (89.4 kg)  01/06/17 184 lb (83.5 kg)  06/04/16 177 lb (80.3 kg)   Constitutional: overweight, in NAD Eyes: PERRLA, EOMI, no exophthalmos ENT: moist mucous membranes, no thyromegaly, no cervical lymphadenopathy Cardiovascular: RRR, No MRG, + lower extremity edema Respiratory: CTA B Gastrointestinal: abdomen soft, NT, ND, BS+ Musculoskeletal: no deformities, strength intact in all 4 Skin: moist, warm, no rashes Neurological: no tremor with outstretched hands, DTR normal in all 4  ASSESSMENT: 1. DM2, insulin-dependent, uncontrolled, without long-term complications, but with hyperglycemia  + hypertensive retinopathy  2. HL  3.  Weight gain  PLAN:  1. Patient with long-standing, uncontrolled, type 2 diabetes, on oral antidiabetic regimen (Actos), and basal-bolus insulin regimen with NPH and regular insulin.  She could not tolerate Basaglar in the past.  She could not afford a GLP-1 receptor agonist.  At last visit, we increase NPH to twice a day and we also added regular insulin since her sugars were also high after meals.  We discussed at length about how to mix the 2 insulins. -At this visit, sugars are much better, and she  is doing a good job taking both regular and NPH insulin before eating breakfast and dinner, but also skipping the regular if she skips that particular meal - Her sugars are at or around goal in the morning, only occasionally higher, many times, with a good reason, for example missing the NPH at night.  She also occasionally has dietary indiscretions and we discussed about needing more regular insulin for those meals -Her sugars are usually lower before lunch, while they are above target after breakfast.  Therefore, we will keep the regular insulin with breakfast at the same dose, but will decrease her NPH in the morning. -We discussed about the need to increase her regular insulin if she gets another steroid injection -Since her sugars improved significantly and she continues to gain weight and have peripheral edema, we will stop Actos at this point - I suggested to:  Patient Instructions   Please continue:  Insulin Before breakfast Before lunch Before dinner  Regular 8  8  NPH 18 (can decrease to 16 units if needed)  22  Please inject the insulin 30 min before meals.  If you need to get another steroid injection, increase regular insulin by 2-4 units at least.  Stop Actos.  Please return in 3 months with your sugar log.   - today, HbA1c is 8% (much improved) - continue checking sugars at different times of the day - check 3x a day, rotating checks - advised for yearly eye exams >> she is UTD - Return to clinic in 3 mo with sugar log    2. HL - Reviewed latest lipid panel from 12/2016: LDL higher than goal -She is not on a statin due to previous side effects with several of them  3.  Weight gain -Unfortunately, she gained 20 pounds since last visit -Discussed that this may be from steroid injections, advised her to only have them if absolutely necessary -Since sugars improved, will stop Actos.  Philemon Kingdom, MD PhD [facility] Endocrinology

## 2017-10-16 NOTE — Patient Instructions (Addendum)
Please continue:  Insulin Before breakfast Before lunch Before dinner  Regular 8  8  NPH 18 (can decrease to 16 units if needed)  22  Please inject the insulin 30 min before meals.  If you need to get another steroid injection, increase regular insulin by 2-4 units at least.  Stop Actos.  Please return in 3 months with your sugar log.

## 2017-10-20 DIAGNOSIS — M25631 Stiffness of right wrist, not elsewhere classified: Secondary | ICD-10-CM | POA: Diagnosis not present

## 2017-10-20 DIAGNOSIS — M25641 Stiffness of right hand, not elsewhere classified: Secondary | ICD-10-CM | POA: Diagnosis not present

## 2017-10-27 DIAGNOSIS — M25631 Stiffness of right wrist, not elsewhere classified: Secondary | ICD-10-CM | POA: Diagnosis not present

## 2017-10-27 DIAGNOSIS — M25641 Stiffness of right hand, not elsewhere classified: Secondary | ICD-10-CM | POA: Diagnosis not present

## 2017-11-03 DIAGNOSIS — M25631 Stiffness of right wrist, not elsewhere classified: Secondary | ICD-10-CM | POA: Diagnosis not present

## 2017-11-03 DIAGNOSIS — M25641 Stiffness of right hand, not elsewhere classified: Secondary | ICD-10-CM | POA: Diagnosis not present

## 2017-11-17 DIAGNOSIS — M25641 Stiffness of right hand, not elsewhere classified: Secondary | ICD-10-CM | POA: Diagnosis not present

## 2017-11-17 DIAGNOSIS — M25631 Stiffness of right wrist, not elsewhere classified: Secondary | ICD-10-CM | POA: Diagnosis not present

## 2017-11-24 DIAGNOSIS — M25641 Stiffness of right hand, not elsewhere classified: Secondary | ICD-10-CM | POA: Diagnosis not present

## 2017-11-24 DIAGNOSIS — M25631 Stiffness of right wrist, not elsewhere classified: Secondary | ICD-10-CM | POA: Diagnosis not present

## 2017-12-01 DIAGNOSIS — M76821 Posterior tibial tendinitis, right leg: Secondary | ICD-10-CM | POA: Diagnosis not present

## 2017-12-29 DIAGNOSIS — M76821 Posterior tibial tendinitis, right leg: Secondary | ICD-10-CM | POA: Diagnosis not present

## 2018-01-05 DIAGNOSIS — E11319 Type 2 diabetes mellitus with unspecified diabetic retinopathy without macular edema: Secondary | ICD-10-CM | POA: Diagnosis not present

## 2018-01-05 DIAGNOSIS — E7800 Pure hypercholesterolemia, unspecified: Secondary | ICD-10-CM | POA: Diagnosis not present

## 2018-01-05 DIAGNOSIS — Z794 Long term (current) use of insulin: Secondary | ICD-10-CM | POA: Diagnosis not present

## 2018-01-05 DIAGNOSIS — M797 Fibromyalgia: Secondary | ICD-10-CM | POA: Diagnosis not present

## 2018-01-05 DIAGNOSIS — I10 Essential (primary) hypertension: Secondary | ICD-10-CM | POA: Diagnosis not present

## 2018-01-05 DIAGNOSIS — M5030 Other cervical disc degeneration, unspecified cervical region: Secondary | ICD-10-CM | POA: Diagnosis not present

## 2018-01-05 DIAGNOSIS — G43909 Migraine, unspecified, not intractable, without status migrainosus: Secondary | ICD-10-CM | POA: Diagnosis not present

## 2018-01-05 DIAGNOSIS — E1165 Type 2 diabetes mellitus with hyperglycemia: Secondary | ICD-10-CM | POA: Diagnosis not present

## 2018-01-05 DIAGNOSIS — E559 Vitamin D deficiency, unspecified: Secondary | ICD-10-CM | POA: Diagnosis not present

## 2018-01-05 DIAGNOSIS — E119 Type 2 diabetes mellitus without complications: Secondary | ICD-10-CM | POA: Diagnosis not present

## 2018-01-06 DIAGNOSIS — I10 Essential (primary) hypertension: Secondary | ICD-10-CM | POA: Diagnosis not present

## 2018-01-06 DIAGNOSIS — E11319 Type 2 diabetes mellitus with unspecified diabetic retinopathy without macular edema: Secondary | ICD-10-CM | POA: Diagnosis not present

## 2018-01-06 DIAGNOSIS — E1165 Type 2 diabetes mellitus with hyperglycemia: Secondary | ICD-10-CM | POA: Diagnosis not present

## 2018-01-06 DIAGNOSIS — Z794 Long term (current) use of insulin: Secondary | ICD-10-CM | POA: Diagnosis not present

## 2018-01-06 DIAGNOSIS — E119 Type 2 diabetes mellitus without complications: Secondary | ICD-10-CM | POA: Diagnosis not present

## 2018-01-06 DIAGNOSIS — E559 Vitamin D deficiency, unspecified: Secondary | ICD-10-CM | POA: Diagnosis not present

## 2018-01-06 DIAGNOSIS — M76821 Posterior tibial tendinitis, right leg: Secondary | ICD-10-CM | POA: Diagnosis not present

## 2018-01-06 DIAGNOSIS — E7800 Pure hypercholesterolemia, unspecified: Secondary | ICD-10-CM | POA: Diagnosis not present

## 2018-01-08 ENCOUNTER — Encounter: Payer: Self-pay | Admitting: Internal Medicine

## 2018-01-08 NOTE — Progress Notes (Signed)
Received labs from PCP from 01/06/2018: CMP normal with the exception of a glucose of 239.  BUN/creatinine 18/0.91, GFR 61. Lipids: 204/115/53/129 Vitamin D 63.6

## 2018-01-13 DIAGNOSIS — M76821 Posterior tibial tendinitis, right leg: Secondary | ICD-10-CM | POA: Diagnosis not present

## 2018-01-14 DIAGNOSIS — J209 Acute bronchitis, unspecified: Secondary | ICD-10-CM | POA: Diagnosis not present

## 2018-01-19 ENCOUNTER — Ambulatory Visit: Payer: Self-pay | Admitting: Internal Medicine

## 2018-01-22 DIAGNOSIS — M5412 Radiculopathy, cervical region: Secondary | ICD-10-CM | POA: Diagnosis not present

## 2018-01-27 DIAGNOSIS — M76821 Posterior tibial tendinitis, right leg: Secondary | ICD-10-CM | POA: Diagnosis not present

## 2018-01-29 DIAGNOSIS — M76821 Posterior tibial tendinitis, right leg: Secondary | ICD-10-CM | POA: Diagnosis not present

## 2018-02-02 DIAGNOSIS — M76821 Posterior tibial tendinitis, right leg: Secondary | ICD-10-CM | POA: Diagnosis not present

## 2018-02-05 DIAGNOSIS — M76821 Posterior tibial tendinitis, right leg: Secondary | ICD-10-CM | POA: Diagnosis not present

## 2018-02-09 DIAGNOSIS — M76821 Posterior tibial tendinitis, right leg: Secondary | ICD-10-CM | POA: Diagnosis not present

## 2018-04-10 DIAGNOSIS — M5412 Radiculopathy, cervical region: Secondary | ICD-10-CM | POA: Diagnosis not present

## 2018-04-10 DIAGNOSIS — M25512 Pain in left shoulder: Secondary | ICD-10-CM | POA: Diagnosis not present

## 2018-04-10 DIAGNOSIS — M25511 Pain in right shoulder: Secondary | ICD-10-CM | POA: Diagnosis not present

## 2018-04-21 DIAGNOSIS — H04123 Dry eye syndrome of bilateral lacrimal glands: Secondary | ICD-10-CM | POA: Diagnosis not present

## 2018-04-21 DIAGNOSIS — H5213 Myopia, bilateral: Secondary | ICD-10-CM | POA: Diagnosis not present

## 2018-04-21 DIAGNOSIS — H524 Presbyopia: Secondary | ICD-10-CM | POA: Diagnosis not present

## 2018-04-21 DIAGNOSIS — H52223 Regular astigmatism, bilateral: Secondary | ICD-10-CM | POA: Diagnosis not present

## 2018-04-21 DIAGNOSIS — H2513 Age-related nuclear cataract, bilateral: Secondary | ICD-10-CM | POA: Diagnosis not present

## 2018-04-21 DIAGNOSIS — E119 Type 2 diabetes mellitus without complications: Secondary | ICD-10-CM | POA: Diagnosis not present

## 2018-04-21 DIAGNOSIS — H02052 Trichiasis without entropian right lower eyelid: Secondary | ICD-10-CM | POA: Diagnosis not present

## 2018-04-27 ENCOUNTER — Ambulatory Visit (INDEPENDENT_AMBULATORY_CARE_PROVIDER_SITE_OTHER): Payer: Medicare Other | Admitting: Internal Medicine

## 2018-04-27 ENCOUNTER — Encounter: Payer: Self-pay | Admitting: Internal Medicine

## 2018-04-27 ENCOUNTER — Other Ambulatory Visit: Payer: Self-pay

## 2018-04-27 VITALS — BP 120/60 | HR 68 | Temp 98.4°F | Ht 65.0 in | Wt 189.0 lb

## 2018-04-27 DIAGNOSIS — I10 Essential (primary) hypertension: Secondary | ICD-10-CM | POA: Diagnosis not present

## 2018-04-27 DIAGNOSIS — E785 Hyperlipidemia, unspecified: Secondary | ICD-10-CM | POA: Diagnosis not present

## 2018-04-27 DIAGNOSIS — M797 Fibromyalgia: Secondary | ICD-10-CM | POA: Diagnosis not present

## 2018-04-27 DIAGNOSIS — G43909 Migraine, unspecified, not intractable, without status migrainosus: Secondary | ICD-10-CM | POA: Diagnosis not present

## 2018-04-27 DIAGNOSIS — M5030 Other cervical disc degeneration, unspecified cervical region: Secondary | ICD-10-CM | POA: Diagnosis not present

## 2018-04-27 DIAGNOSIS — F419 Anxiety disorder, unspecified: Secondary | ICD-10-CM | POA: Diagnosis not present

## 2018-04-27 DIAGNOSIS — R635 Abnormal weight gain: Secondary | ICD-10-CM

## 2018-04-27 DIAGNOSIS — E11319 Type 2 diabetes mellitus with unspecified diabetic retinopathy without macular edema: Secondary | ICD-10-CM | POA: Diagnosis not present

## 2018-04-27 DIAGNOSIS — Z794 Long term (current) use of insulin: Secondary | ICD-10-CM | POA: Diagnosis not present

## 2018-04-27 DIAGNOSIS — E559 Vitamin D deficiency, unspecified: Secondary | ICD-10-CM | POA: Diagnosis not present

## 2018-04-27 DIAGNOSIS — J302 Other seasonal allergic rhinitis: Secondary | ICD-10-CM | POA: Diagnosis not present

## 2018-04-27 DIAGNOSIS — E7800 Pure hypercholesterolemia, unspecified: Secondary | ICD-10-CM | POA: Diagnosis not present

## 2018-04-27 DIAGNOSIS — E1165 Type 2 diabetes mellitus with hyperglycemia: Secondary | ICD-10-CM | POA: Diagnosis not present

## 2018-04-27 LAB — POCT GLYCOSYLATED HEMOGLOBIN (HGB A1C): Hemoglobin A1C: 9.7 % — AB (ref 4.0–5.6)

## 2018-04-27 NOTE — Patient Instructions (Addendum)
  Please change: Insulin Before breakfast Before lunch Before dinner  Regular 12-15 8-10 12-15  NPH 20-24  20  Please inject the insulin 30 min before meals.  If you need to get another steroid injection, increase regular insulin by 2-4 units at least.  Please return in 3 months with your sugar log.

## 2018-04-27 NOTE — Progress Notes (Signed)
qPatient ID: [name], female   DOB: 1950-10-26, 68 y.o.   MRN: [id number]   HPI: [name] is a 68 y.o.-year-old female, returning for f/u for DM2, dx in 2006-2007, insulin-dependent since 2017, uncontrolled, without long term complications.  She saw Dr. Buddy Duty in the past. Last visit with me 6 months ago.  At last visit, sugars are higher due to neck pain and steroid injections in neck.  She was in a L ankle cast for ankle joint pain. She had steroid inj's >> sugars higher: even in the 400s.  Last hemoglobin A1c was: Lab Results  Component Value Date   HGBA1C 8.0 (A) 10/16/2017   HGBA1C 10.9 03/24/2017  12/09/2016: HbA1c 10.7% 07/10/2016: HbA1c 9.6%  Pt was on a regimen of: - NPH 25 units at bedtime - Actos 15 mg in a.m.  She is currently on: Insulin Before breakfast Before lunch Before dinner  Regular 8 (12)  8 (12) - forgets doses  NPH 16-18 (20)  22 (20)  Please inject the insulin 30 min before meals.  She could not afford Trulicity. We stopped Actos 10/2017  She has tried:  Metformin >> stomach cramps, diarrhea  Invokana >> dry mouth, rash on face  Basaglar >> back muscle spasms, congestion  Pt checks her sugars 0-1 a day: - am: 168-188 >> 103-171, 239 >> 87-160, 198 >> 103, 142-248, 262 - 2h after b'fast: n/c >> 273, 283 >>140-205, 244  >> 190 - before lunch: n/c >> 61, 95 >> 235 - 2h after lunch: n/c  - before dinner: n/c >> 182 >> 125, 187 >> n/c - 2h after dinner: n/c >> 129, 189 >> n/c - bedtime: n/c >> 95, 127-145 >> n/c - nighttime: n/c >> 66 Lowest sugar was 128 >> 103 >> 61 >> 103; it is unclear at which level she has hypoglycemia awareness Highest sugar was 263 >> 273 >> 424 (steroids) >> 473 (steroids).  Glucometer: Accu-Chek  Pt's meals are: - Breakfast: oatmeal, Kashi cereal, English muffin, whole wheat bread - Lunch: sandwich, salad - Dinner: (late, 8 pm): veggies, meat or pasta or eggs + toast (largest) - Snacks: none + Coke 0, tea  + Splenda  -No CKD, last BUN/creatinine:  01/06/2018: CMP normal with the exception of a glucose of 239.  BUN/creatinine 18/0.91, GFR 61. Lab Results  Component Value Date   BUN 14 01/06/2017   BUN 23 (H) 07/07/2015   CREATININE 0.81 01/06/2017   CREATININE 1.01 (H) 07/07/2015  07/10/2016: ACR 5.5  -+ HL; last set of lipids: 01/06/2018: 204/115/53/129 12/10/2016: 226/113/57/147. No results found for: CHOL, HDL, LDLCALC, LDLDIRECT, TRIG, CHOLHDL  Not on a statin.  Lovastatin and atorvastatin caused her muscle aches.  She also did not tolerate rosuvastatin.  - last eye exam was on 04/19/2017: No DR  -Denies numbness and tingling in her feet.   She is on Neurontin 100 mg 3 times a day for trigeminal neuralgia.  On ASA 81.  Unclear FH of DM -patient is adopted.  She also has a history of HTN, fibromyalgia, vitamin D deficiency, PCOS, fatty liver, kidney stones, trigeminal neuralgia, migraines.  ROS: Constitutional: no weight gain/no weight loss, no fatigue, no subjective hyperthermia, no subjective hypothermia Eyes: no blurry vision, no xerophthalmia ENT: no sore throat, no nodules palpated in neck, no dysphagia, no odynophagia, no hoarseness Cardiovascular: no CP/no SOB/no palpitations/no leg swelling Respiratory: no cough/no SOB/no wheezing Gastrointestinal: no N/no V/no D/no C/no acid reflux Musculoskeletal: no muscle aches/no joint  aches Skin: no rashes, no hair loss Neurological: no tremors/no numbness/no tingling/no dizziness  I reviewed pt's medications, allergies, PMH, social hx, family hx, and changes were documented in the history of present illness. Otherwise, unchanged from my initial visit note.  Past Medical History:  Diagnosis Date  . Diabetes mellitus   . Diverticulitis   . Fibromyalgia   . Hypercholesteremia   . Hypertension   . IBS (irritable bowel syndrome)   . Migraines   . Nasal fracture   . Stein-Leventhal ovaries    Past Surgical History:   Procedure Laterality Date  . CHOLECYSTECTOMY  1998  . KNEE SURGERY Right 2017  . LEFT HEART CATHETERIZATION WITH CORONARY ANGIOGRAM N/A 12/19/2011   Procedure: LEFT HEART CATHETERIZATION WITH CORONARY ANGIOGRAM;  Surgeon: Candee Furbish, MD;  Location: [facility] CATH LAB;  Service: Cardiovascular;  Laterality: N/A;  . SHOULDER SURGERY    . TONSILLECTOMY  1959   Social History   Socioeconomic History  . Marital status: Single    Spouse name: Not on file  . Number of children: 0  . Years of education: Some college  . Highest education level: Not on file  Occupational History  . 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 Financial resource strain: Not on file  . Food insecurity:    Worry: Not on file    Inability: Not on file  . Transportation needs:    Medical: Not on file    Non-medical: Not on file  Tobacco Use  . Smoking status: Never Smoker  . Smokeless tobacco: Never Used  Substance and Sexual Activity  . Alcohol use: No    Comment: 1/2 glass yearly  . Drug use: No  . Sexual activity: Not on file  Lifestyle  . Physical activity:    Days per week: Not on file    Minutes per session: Not on file  . Stress: Not on file  Relationships  . Social connections:    Talks on phone: Not on file    Gets together: Not on file    Attends religious service: Not on file    Active member of club or organization: Not on file    Attends meetings of clubs or organizations: Not on file    Relationship status: Not on file  . Intimate partner violence:    Fear of current or ex partner: Not on file    Emotionally abused: Not on file    Physically abused: Not on file    Forced sexual activity: Not on file  Other Topics Concern  . Not on file  Social History Narrative   Lives alone w/ her 3 cats   Right-handed   Caffeine: 2 cups coffee/tea per day   Current Outpatient Medications on File Prior to Visit  Medication Sig Dispense Refill  . ACCU-CHEK SMARTVIEW  test strip Use as instructed to test twice daily DX E11.65 200 each 3  . acetaminophen-codeine (TYLENOL #3) 300-30 MG per tablet Take 1 tablet by mouth every 4 (four) hours as needed. pain    . aspirin EC 81 MG tablet Take 81 mg by mouth every evening.    . cholecalciferol (VITAMIN D) 1000 UNITS tablet Take 1,000 Units by mouth daily.    Marland Kitchen gabapentin (NEURONTIN) 100 MG capsule Take 1 capsule (100 mg total) by mouth 3 (three) times daily. 90 capsule 12  . hydrochlorothiazide (HYDRODIURIL) 25 MG tablet Take 12.5 mg by mouth daily.    Marland Kitchen HYDROcodone-acetaminophen (  NORCO) 7.5-325 MG tablet Take 1 tablet by mouth every 6 (six) hours as needed for moderate pain.    Marland Kitchen insulin NPH Human (NOVOLIN N) 100 UNIT/ML injection Inject 18 units in am and 25 units in pm 10 mL 11  . insulin regular (NOVOLIN R RELION) 100 units/mL injection Inject 0.08-0.1 mLs (8-10 Units total) into the skin 2 (two) times daily before a meal. 10 mL 11  . Lancets (ACCU-CHEK MULTICLIX) lancets Use as instructed to test once daily DX E11.65 100 each 12  . metoprolol (LOPRESSOR) 50 MG tablet Take 25 mg by mouth 2 (two) times daily.  0  . PRESCRIPTION MEDICATION Patient receives a Steroid injection in the neck once a year with Dr. Mina Marble at [facility] pt is unable to recall the exact name and i have been unable to verify name from the Dr's office. Patient also states " it shoots up my blood sugars really high, but i still get it because i can't take anything else for my neck pain"    . promethazine (PHENADOZ) 25 MG suppository Place 25 mg rectally once.    Marland Kitchen tiZANidine (ZANAFLEX) 4 MG tablet   0   Current Facility-Administered Medications on File Prior to Visit  Medication Dose Route Frequency Provider Last Rate Last Dose  . methylPREDNISolone sodium succinate (SOLU-MEDROL) 130 mg in sodium chloride 0.9 % 50 mL IVPB  130 mg Intravenous Once Jerline Pain, MD       Allergies  Allergen Reactions  . Contrast Media [Iodinated  Diagnostic Agents] Hives  . Augmentin [Amoxicillin-Pot Clavulanate] Nausea And Vomiting    Nausea, vomiting, headache, dizziness   pt states she can take other Penicillins just not augmentin  . Basaglar Kwikpen [Insulin [place]   . Erythromycin   . Metformin And Related Diarrhea and Nausea And Vomiting    Violently ill for 2 yrs  . Nsaids Other (See Comments)    Stomach problems  . Prednisone Other (See Comments)    Abnormal blood sugars..  . Statins Other (See Comments)    Muscle pain/ weekness  . Sulfa Antibiotics Other (See Comments)    blackout  . Invokana [Canagliflozin] Rash and Other (See Comments)    Dehydration  . Tramadol Rash   Family History  Adopted: Yes  Problem Relation Age of Onset  . Failure to thrive Mother   . Heart failure Father   . Neuropathy Neg Hx    PE: BP 120/60   Pulse 68   Temp 98.4 F (36.9 C)   Ht 5\' 5"  (1.651 m)   Wt 189 lb (85.7 kg)   SpO2 99%   BMI 31.45 kg/m   Wt Readings from Last 3 Encounters:  04/27/18 189 lb (85.7 kg)  10/16/17 197 lb (89.4 kg)  01/06/17 184 lb (83.5 kg)   Constitutional: overweight, in NAD Eyes: PERRLA, EOMI, no exophthalmos ENT: moist mucous membranes, no thyromegaly, no cervical lymphadenopathy Cardiovascular: RRR, No MRG Respiratory: CTA B Gastrointestinal: abdomen soft, NT, ND, BS+ Musculoskeletal: no deformities, strength intact in all 4 Skin: moist, warm, no rashes Neurological: no tremor with outstretched hands, DTR normal in all 4  ASSESSMENT: 1. DM2, insulin-dependent, uncontrolled, without long-term complications, but with hyperglycemia  + hypertensive retinopathy  2. HL  3.  Weight gain  PLAN:  1. Patient with longstanding, uncontrolled, type 2 diabetes, on basal-bolus insulin regimen with NPH and regular insulin.  She could not tolerate Basaglar in the past and could not afford the GLP-1 receptor agonist.  At last visit, we stopped Actos due to improved sugars and also significant  weight gain and LE edema.  Her sugars at that time were around goal in the morning and only occasionally higher, usually after sleeping NPH at night or dietary indiscretion.  We discussed that she may need slightly higher doses of regular insulin if she has a richer meal or if she restarts steroids.  The sugars were lower before lunch and they were above target after breakfast.  We increased NPH in the morning. -Her sugars are much higher at this visit as she mentions that she is not active at all, but only goes to work, comes home, eats, and sleeps.  Also, many times she forgets her regular insulin before dinner.  She has increased insulin doses since last visit but these appear to be not enough, especially after her steroid injections. -She would not want to add another medicine at this point -She definitely needs to be more active and not to forget her insulin doses and we discussed about setting alarms on her phone to remind her.  However, in the meantime, I suggested to increase almost all doses of insulin, and also add a regular insulin dose with lunch. - I suggested to:  Patient Instructions   Please change: Insulin Before breakfast Before lunch Before dinner  Regular 12-15 8-10 12-15  NPH 20-24  20  Please inject the insulin 30 min before meals.  If you need to get another steroid injection, increase regular insulin by 2-4 units at least.  Please return in 3 months with your sugar log.   - today, HbA1c is 9.7% (higher) - continue checking sugars at different times of the day - check 3x a day, rotating checks - advised for yearly eye exams >> she is UTD - Return to clinic in 3 mo with sugar log    2. HL - Reviewed latest lipid panel from 01/2018: LDL high, but improved -She is not on a statin due to previous side effects with several of them  3.  Weight gain -She had gained 20 pounds before last visit, but lost 8 lbs since last visit -We discussed at that time that this could be  due to her steroid injections and to only have them if absolutely necessary -Since her sugars were improved then, we also stopped Actos, which is conducive to weight gain. LE edema has decreased.  Philemon Kingdom, MD PhD [facility] Endocrinology

## 2018-06-10 DIAGNOSIS — M5412 Radiculopathy, cervical region: Secondary | ICD-10-CM | POA: Diagnosis not present

## 2018-07-20 ENCOUNTER — Ambulatory Visit: Payer: Self-pay | Admitting: Internal Medicine

## 2018-08-03 DIAGNOSIS — M25562 Pain in left knee: Secondary | ICD-10-CM | POA: Diagnosis not present

## 2018-09-07 DIAGNOSIS — M25562 Pain in left knee: Secondary | ICD-10-CM | POA: Diagnosis not present

## 2018-09-21 DIAGNOSIS — M1712 Unilateral primary osteoarthritis, left knee: Secondary | ICD-10-CM | POA: Diagnosis not present

## 2018-09-24 DIAGNOSIS — M1712 Unilateral primary osteoarthritis, left knee: Secondary | ICD-10-CM | POA: Diagnosis not present

## 2018-09-30 DIAGNOSIS — M1712 Unilateral primary osteoarthritis, left knee: Secondary | ICD-10-CM | POA: Diagnosis not present

## 2018-10-05 DIAGNOSIS — M1712 Unilateral primary osteoarthritis, left knee: Secondary | ICD-10-CM | POA: Diagnosis not present

## 2018-10-07 DIAGNOSIS — M1712 Unilateral primary osteoarthritis, left knee: Secondary | ICD-10-CM | POA: Diagnosis not present

## 2018-10-13 DIAGNOSIS — M1712 Unilateral primary osteoarthritis, left knee: Secondary | ICD-10-CM | POA: Diagnosis not present

## 2018-10-15 DIAGNOSIS — M1712 Unilateral primary osteoarthritis, left knee: Secondary | ICD-10-CM | POA: Diagnosis not present

## 2018-10-19 DIAGNOSIS — I10 Essential (primary) hypertension: Secondary | ICD-10-CM | POA: Diagnosis not present

## 2018-10-19 DIAGNOSIS — E7800 Pure hypercholesterolemia, unspecified: Secondary | ICD-10-CM | POA: Diagnosis not present

## 2018-10-19 DIAGNOSIS — Z23 Encounter for immunization: Secondary | ICD-10-CM | POA: Diagnosis not present

## 2018-10-19 DIAGNOSIS — F419 Anxiety disorder, unspecified: Secondary | ICD-10-CM | POA: Diagnosis not present

## 2018-10-19 DIAGNOSIS — Z794 Long term (current) use of insulin: Secondary | ICD-10-CM | POA: Diagnosis not present

## 2018-10-19 DIAGNOSIS — E559 Vitamin D deficiency, unspecified: Secondary | ICD-10-CM | POA: Diagnosis not present

## 2018-10-19 DIAGNOSIS — E1165 Type 2 diabetes mellitus with hyperglycemia: Secondary | ICD-10-CM | POA: Diagnosis not present

## 2018-10-20 DIAGNOSIS — M1712 Unilateral primary osteoarthritis, left knee: Secondary | ICD-10-CM | POA: Diagnosis not present

## 2018-10-22 ENCOUNTER — Other Ambulatory Visit: Payer: Self-pay

## 2018-10-26 ENCOUNTER — Encounter: Payer: Self-pay | Admitting: Internal Medicine

## 2018-10-26 ENCOUNTER — Other Ambulatory Visit: Payer: Self-pay

## 2018-10-26 ENCOUNTER — Ambulatory Visit (INDEPENDENT_AMBULATORY_CARE_PROVIDER_SITE_OTHER): Payer: Medicare Other | Admitting: Internal Medicine

## 2018-10-26 VITALS — BP 128/80 | HR 74 | Ht 65.0 in | Wt 189.0 lb

## 2018-10-26 DIAGNOSIS — R635 Abnormal weight gain: Secondary | ICD-10-CM | POA: Diagnosis not present

## 2018-10-26 DIAGNOSIS — M25562 Pain in left knee: Secondary | ICD-10-CM | POA: Diagnosis not present

## 2018-10-26 DIAGNOSIS — E785 Hyperlipidemia, unspecified: Secondary | ICD-10-CM | POA: Diagnosis not present

## 2018-10-26 DIAGNOSIS — Z794 Long term (current) use of insulin: Secondary | ICD-10-CM

## 2018-10-26 DIAGNOSIS — E1165 Type 2 diabetes mellitus with hyperglycemia: Secondary | ICD-10-CM

## 2018-10-26 LAB — POCT GLYCOSYLATED HEMOGLOBIN (HGB A1C): Hemoglobin A1C: 8.3 % — AB (ref 4.0–5.6)

## 2018-10-26 NOTE — Addendum Note (Signed)
Addended by: Cardell Peach I on: 10/26/2018 01:26 PM   Modules accepted: Orders

## 2018-10-26 NOTE — Patient Instructions (Addendum)
Please change: Insulin Before breakfast Before lunch Before dinner  Regular 20-25 (8-10) 20-25  NPH 20-25  25  Please inject the insulin 30 min before meals.  Please return in 3 months with your sugar log.

## 2018-10-26 NOTE — Progress Notes (Signed)
qPatient ID: [name], female   DOB: 12/17/1950, 68 y.o.   MRN: [id number]   HPI: [name] is a 68 y.o.-year-old female, returning for f/u for DM2, dx in 2006-2007, insulin-dependent since 2017, uncontrolled, without long term complications.  She saw Dr. Buddy Duty in the past. Last visit with me 6 months ago.  Last hemoglobin A1c was: Lab Results  Component Value Date   HGBA1C 9.7 (A) 04/27/2018   HGBA1C 8.0 (A) 10/16/2017   HGBA1C 10.9 03/24/2017  12/09/2016: HbA1c 10.7% 07/10/2016: HbA1c 9.6%  Pt was on a regimen of: - NPH 25 units at bedtime - Actos 15 mg in a.m.  She is on: (Doses changed at last visit are shown in bold) Insulin Before breakfast Before lunch Before dinner  Regular 12-15 >> 20  12-15 >> 20  NPH 20-24 >> 20  20  Please inject the insulin 30 min before meals.  She has tried:  Metformin >> stomach cramps, diarrhea  Invokana >> dry mouth, rash on face  Basaglar >> back muscle spasms, congestion She could not afford Trulicity. We stopped Actos 10/2017  Pt checks her sugars 0 to once a day: - am:  87-160, 198 >> 103, 142-248, 262 >> 87-216 (late dinner), 239, 240 -forgot shots - 2h after b'fast:273, 283 >>140-205, 244  >> 190 >> 184, 244 - before lunch: n/c >> 61, 95 >> 235 >> 73, 99-166, 176 - 2h after lunch: n/c  - before dinner: n/c >> 182 >> 125, 187 >> n/c - 2h after dinner: n/c >> 129, 189 >> n/c - bedtime: n/c >> 95, 127-145 >> n/c >> 253 - nighttime: n/c >> 66 Lowest sugar was 61 >> 103 >> 73; it is unclear at which level she has hypoglycemia awareness Highest sugar was  424 (steroids) >> 473 (steroids) >> 200s.  Glucometer: Accu-Chek  Pt's meals are: - Breakfast: oatmeal, Kashi cereal, English muffin, whole wheat bread - Lunch: sandwich, salad - Dinner: (late, 8 pm): veggies, meat or pasta or eggs + toast (largest) - Snacks: none + Coke 0, tea + Splenda  -No CKD, last BUN/creatinine:  01/06/2018: CMP normal with the exception of a  glucose of 239.  BUN/creatinine 18/0.91, GFR 61. Lab Results  Component Value Date   BUN 14 01/06/2017   BUN 23 (H) 07/07/2015   CREATININE 0.81 01/06/2017   CREATININE 1.01 (H) 07/07/2015  07/10/2016: ACR 5.5  -+ HL; last set of lipids: 01/06/2018: 204/115/53/129 12/10/2016: 226/113/57/147. No results found for: CHOL, HDL, LDLCALC, LDLDIRECT, TRIG, CHOLHDL  Not on a statin.  Lovastatin and atorvastatin caused her muscle aches.  She also did not tolerate rosuvastatin.  - last eye exam was on 03/2018: No DR  -She denies numbness and tingling in her feet.   She is on Neurontin 100 mg 3 times a day but for trigeminal neuralgia.  On ASA 81.  Unclear FH of DM -patient is adopted.  She also has a history of HTN, fibromyalgia, vitamin D deficiency, PCOS, fatty liver, kidney stones, trigeminal neuralgia, migraines.  ROS: Constitutional: no weight gain/no weight loss, no fatigue, no subjective hyperthermia, no subjective hypothermia Eyes: no blurry vision, no xerophthalmia ENT: no sore throat, no nodules palpated in neck, no dysphagia, no odynophagia, no hoarseness Cardiovascular: no CP/no SOB/no palpitations/no leg swelling Respiratory: no cough/no SOB/no wheezing Gastrointestinal: no N/no V/no D/no C/no acid reflux Musculoskeletal: + muscle aches/+ joint aches Skin: no rashes, no hair loss Neurological: no tremors/no numbness/no tingling/no dizziness  I reviewed  pt's medications, allergies, PMH, social hx, family hx, and changes were documented in the history of present illness. Otherwise, unchanged from my initial visit note.  Past Medical History:  Diagnosis Date  . Diabetes mellitus   . Diverticulitis   . Fibromyalgia   . Hypercholesteremia   . Hypertension   . IBS (irritable bowel syndrome)   . Migraines   . Nasal fracture   . Stein-Leventhal ovaries    Past Surgical History:  Procedure Laterality Date  . CHOLECYSTECTOMY  1998  . KNEE SURGERY Right 2017  . LEFT HEART  CATHETERIZATION WITH CORONARY ANGIOGRAM N/A 12/19/2011   Procedure: LEFT HEART CATHETERIZATION WITH CORONARY ANGIOGRAM;  Surgeon: Candee Furbish, MD;  Location: [facility] CATH LAB;  Service: Cardiovascular;  Laterality: N/A;  . SHOULDER SURGERY    . TONSILLECTOMY  1959   Social History   Socioeconomic History  . Marital status: Single    Spouse name: Not on file  . Number of children: 0  . Years of education: Some college  . Highest education level: Not on file  Occupational History  . 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 Financial resource strain: Not on file  . Food insecurity    Worry: Not on file    Inability: Not on file  . Transportation needs    Medical: Not on file    Non-medical: Not on file  Tobacco Use  . Smoking status: Never Smoker  . Smokeless tobacco: Never Used  Substance and Sexual Activity  . Alcohol use: No    Comment: 1/2 glass yearly  . Drug use: No  . Sexual activity: Not on file  Lifestyle  . Physical activity    Days per week: Not on file    Minutes per session: Not on file  . Stress: Not on file  Relationships  . Social Herbalist on phone: Not on file    Gets together: Not on file    Attends religious service: Not on file    Active member of club or organization: Not on file    Attends meetings of clubs or organizations: Not on file    Relationship status: Not on file  . Intimate partner violence    Fear of current or ex partner: Not on file    Emotionally abused: Not on file    Physically abused: Not on file    Forced sexual activity: Not on file  Other Topics Concern  . Not on file  Social History Narrative   Lives alone w/ her 3 cats   Right-handed   Caffeine: 2 cups coffee/tea per day   Current Outpatient Medications on File Prior to Visit  Medication Sig Dispense Refill  . ACCU-CHEK SMARTVIEW test strip Use as instructed to test twice daily DX E11.65 200 each 3  . acetaminophen-codeine  (TYLENOL #3) 300-30 MG per tablet Take 1 tablet by mouth every 4 (four) hours as needed. pain    . aspirin EC 81 MG tablet Take 81 mg by mouth every evening.    . cholecalciferol (VITAMIN D) 1000 UNITS tablet Take 1,000 Units by mouth daily.    Marland Kitchen gabapentin (NEURONTIN) 100 MG capsule Take 1 capsule (100 mg total) by mouth 3 (three) times daily. 90 capsule 12  . hydrochlorothiazide (HYDRODIURIL) 25 MG tablet Take 12.5 mg by mouth daily.    Marland Kitchen HYDROcodone-acetaminophen (NORCO) 7.5-325 MG tablet Take 1 tablet by mouth every 6 (six) hours as needed for  moderate pain.    Marland Kitchen insulin NPH Human (NOVOLIN N) 100 UNIT/ML injection Inject 18 units in am and 25 units in pm 10 mL 11  . insulin regular (NOVOLIN R RELION) 100 units/mL injection Inject 0.08-0.1 mLs (8-10 Units total) into the skin 2 (two) times daily before a meal. 10 mL 11  . Lancets (ACCU-CHEK MULTICLIX) lancets Use as instructed to test once daily DX E11.65 100 each 12  . metoprolol (LOPRESSOR) 50 MG tablet Take 25 mg by mouth 2 (two) times daily.  0  . PRESCRIPTION MEDICATION Patient receives a Steroid injection in the neck once a year with Dr. Mina Marble at [facility] pt is unable to recall the exact name and i have been unable to verify name from the Dr's office. Patient also states " it shoots up my blood sugars really high, but i still get it because i can't take anything else for my neck pain"    . promethazine (PHENADOZ) 25 MG suppository Place 25 mg rectally once.    Marland Kitchen tiZANidine (ZANAFLEX) 4 MG tablet   0   Current Facility-Administered Medications on File Prior to Visit  Medication Dose Route Frequency Provider Last Rate Last Dose  . methylPREDNISolone sodium succinate (SOLU-MEDROL) 130 mg in sodium chloride 0.9 % 50 mL IVPB  130 mg Intravenous Once Jerline Pain, MD       Allergies  Allergen Reactions  . Contrast Media [Iodinated Diagnostic Agents] Hives  . Augmentin [Amoxicillin-Pot Clavulanate] Nausea And Vomiting    Nausea,  vomiting, headache, dizziness   pt states she can take other Penicillins just not augmentin  . Basaglar Kwikpen [Insulin [place]   . Erythromycin   . Metformin And Related Diarrhea and Nausea And Vomiting    Violently ill for 2 yrs  . Nsaids Other (See Comments)    Stomach problems  . Prednisone Other (See Comments)    Abnormal blood sugars..  . Statins Other (See Comments)    Muscle pain/ weekness  . Sulfa Antibiotics Other (See Comments)    blackout  . Invokana [Canagliflozin] Rash and Other (See Comments)    Dehydration  . Tramadol Rash   Family History  Adopted: Yes  Problem Relation Age of Onset  . Failure to thrive Mother   . Heart failure Father   . Neuropathy Neg Hx    PE: BP 128/80   Pulse 74   Ht 5\' 5"  (1.651 m)   Wt 189 lb (85.7 kg) Comment: per patient via home scale, refuse to weigh in office  SpO2 98%   BMI 31.45 kg/m   Wt Readings from Last 3 Encounters:  10/26/18 189 lb (85.7 kg)  04/27/18 189 lb (85.7 kg)  10/16/17 197 lb (89.4 kg)   Constitutional: overweight, in NAD Eyes: PERRLA, EOMI, no exophthalmos ENT: moist mucous membranes, no thyromegaly, no cervical lymphadenopathy Cardiovascular: RRR, No MRG Respiratory: CTA B Gastrointestinal: abdomen soft, NT, ND, BS+ Musculoskeletal: no deformities, strength intact in all 4 Skin: moist, warm, no rashes Neurological: no tremor with outstretched hands, DTR normal in all 4  ASSESSMENT: 1. DM2, insulin-dependent, uncontrolled, without long-term complications, but with hyperglycemia  + hypertensive retinopathy  2. HL  3.  Weight gain  PLAN:  1. Patient with longstanding, uncontrolled, type 2 diabetes, on basal-bolus insulin regimen with NPH and regular insulin.  She could not tolerate Basaglar in the past and could not afford GLP-1 receptor agonist.  Last year we stopped Actos due to improved blood sugars and also significant  weight gain and lower extremity edema.  She did lose weight and her  edema decreased afterwards.  Sugars are higher at last visit as she was not active at all and was eating more.  Also, she was forgetting her regular insulin before dinner.  She did not want to add another medicine at that point but we discussed about the need to be more active and also to set alarms on her phone to remind her to inject her insulin.  We also increase the doses slightly and added regular insulin before lunch. -At this visit, unfortunately, she still mentions that she has an erratic schedule and she is forgetting insulin doses.  Also, she did not introduce lunchtime insulin.  She is not checking sugars before dinner to see if she continues to need this.  I advised her to start checking blood sugars before dinner and take some insulin with lunch if the sugars are higher than 140.  In the meantime, since the sugars are mostly high, will increase the rest of the insulin doses.  During the weekend, on Saturday, she is more active and her sugars appear to be better.  I advised her to take only 20 units of regular and 20 units of NPH on Saturday morning. -She is going to get a steroid injection today.  I advised her to increase all insulin doses after the injection since she usually becomes quite hyperglycemic after this. - I suggested to:  Patient Instructions   Please change: Insulin Before breakfast Before lunch Before dinner  Regular 20-25 (8-10) 20-25  NPH 20-25  25  Please inject the insulin 30 min before meals.  Please return in 3 months with your sugar log.   - we checked her HbA1c: 8.3% (lower) - advised to check sugars at different times of the day - 3x a day, rotating check times - advised for yearly eye exams >> she is UTD - return to clinic in 3 months    2. HL - Reviewed latest lipid panel from 01/2018: LDL high, but improved - Continues off statins due to previous side effects with several of them  3.  Weight gain -We stopped Actos in the past, which was conducive to  weight gain -Unfortunately, we need to continue on insulin, which is also conducive to weight gain  Philemon Kingdom, MD PhD [facility]

## 2018-11-05 DIAGNOSIS — M25562 Pain in left knee: Secondary | ICD-10-CM | POA: Diagnosis not present

## 2018-11-13 DIAGNOSIS — M25562 Pain in left knee: Secondary | ICD-10-CM | POA: Diagnosis not present

## 2018-12-25 DIAGNOSIS — M2242 Chondromalacia patellae, left knee: Secondary | ICD-10-CM | POA: Diagnosis not present

## 2018-12-25 DIAGNOSIS — X58XXXA Exposure to other specified factors, initial encounter: Secondary | ICD-10-CM | POA: Diagnosis not present

## 2018-12-25 DIAGNOSIS — Y999 Unspecified external cause status: Secondary | ICD-10-CM | POA: Diagnosis not present

## 2018-12-25 DIAGNOSIS — S83232A Complex tear of medial meniscus, current injury, left knee, initial encounter: Secondary | ICD-10-CM | POA: Diagnosis not present

## 2019-01-13 ENCOUNTER — Other Ambulatory Visit: Payer: Self-pay | Admitting: Internal Medicine

## 2019-01-25 ENCOUNTER — Other Ambulatory Visit: Payer: Self-pay

## 2019-01-25 ENCOUNTER — Ambulatory Visit (INDEPENDENT_AMBULATORY_CARE_PROVIDER_SITE_OTHER): Payer: Medicare Other | Admitting: Internal Medicine

## 2019-01-25 ENCOUNTER — Encounter: Payer: Self-pay | Admitting: Internal Medicine

## 2019-01-25 DIAGNOSIS — Z789 Other specified health status: Secondary | ICD-10-CM | POA: Diagnosis not present

## 2019-01-25 NOTE — Patient Instructions (Addendum)
Please continue: Insulin Before breakfast Before lunch Before dinner  Regular 15-20 (25) (8-10) 15-20 (25)  NPH 20-25  25  Please inject the insulin 30 min before meals.  Please return in 4 months with your sugar log.

## 2019-01-25 NOTE — Progress Notes (Signed)
Patient ID: [name], female   DOB: 1950-07-27, 68 y.o.   MRN: [id number]   Patient location: Work My location: Office Persons participating in the virtual visit: patient, provider  Referring Provider: Leighton Ruff, MD  I connected with the patient on 01/25/19 at  12:57 PM EST by telephone and verified that I am speaking with the correct person.   I discussed the limitations of evaluation and management by telephone and the availability of in person appointments. The patient expressed understanding and agreed to proceed.   Details of the encounter are shown below.  HPI: [name] is a 68 y.o.-year-old female, returning for f/u for DM2, dx in 2006-2007, insulin-dependent since 2017, uncontrolled, without long term complications.  She saw Dr. Buddy Duty in the past. Last visit with me 3 months ago.  She had L knee surgery recently. Now in PT.  She has cervical spine steroid injections.  Reviewed HbA1c levels: Lab Results  Component Value Date   HGBA1C 8.3 (A) 10/26/2018   HGBA1C 9.7 (A) 04/27/2018   HGBA1C 8.0 (A) 10/16/2017  12/09/2016: HbA1c 10.7% 07/10/2016: HbA1c 9.6%  Pt was on a regimen of: - NPH 25 units at bedtime - Actos 15 mg in a.m.  She is on: Insulin Before breakfast Before lunch Before dinner  Regular 20-25 (15)  20-25 (15)  NPH 20-25  25  Please inject the insulin 30 min before meals.  She has tried:  Metformin >> stomach cramps, diarrhea  Invokana >> dry mouth, rash on face  Basaglar >> back muscle spasms, congestion She could not afford Trulicity. We stopped Actos 10/2017  Pt checks her sugars 1-2x today: - am:  103, 142-248, 262 >> 87-216, 239, 240 -forgot shots >> 113-143 - 2h after b'fast: 140-205, 244  >> 190 >> 184, 244 >> n/c - before lunch: n/c >> 61, 95 >> 235 >> 73, 99-166, 176 >> n/c - 2h after lunch: n/c  - snack - before dinner: n/c >> 182 >> 125, 187 >> n/c >> 140-150s, 173 - 2h after dinner: n/c >> 129, 189 >> n/c -  bedtime: n/c >> 95, 127-145 >> n/c >> 253 >> n/c - nighttime: n/c >> 66 >> n/c Lowest sugar was 61 >> 103 >> 73 >> 113; it is unclear at which level she has hypoglycemia awareness Highest sugar was  424 (steroids) >> 473 (steroids) >> 200s >> 173.  Glucometer: Accu-Chek  Pt's meals are: - Breakfast: oatmeal, Kashi cereal, English muffin, whole wheat bread - Lunch: sandwich, salad - Dinner: (late, 8 pm): veggies, meat or pasta or eggs + toast (largest) - Snacks: none + Coke 0, tea + Splenda  -No CKD, last BUN/creatinine:  01/06/2018: CMP normal with the exception of a glucose of 239.  BUN/creatinine 18/0.91, GFR 61. Lab Results  Component Value Date   BUN 14 01/06/2017   BUN 23 (H) 07/07/2015   CREATININE 0.81 01/06/2017   CREATININE 1.01 (H) 07/07/2015  07/10/2016: ACR 5.5  -+ HL; last set of lipids: 01/15/2019:206/221/50/118 01/06/2018: 204/115/53/129 12/10/2016: 226/113/57/147. No results found for: CHOL, HDL, LDLCALC, LDLDIRECT, TRIG, CHOLHDL  She is not on a statin.  Lovastatin and atorvastatin caused her muscle aches.  She also did not tolerate rosuvastatin  - last eye exam was on 03/2018: No DR  -She denies numbness and tingling in her feet She is on Neurontin 100 mg 3 times a day but for trigeminal neuralgia.  On ASA 81.  Unclear FH of DM -patient is adopted.  She also has a history of HTN, fibromyalgia, vitamin D deficiency, PCOS, fatty liver, kidney stones, trigeminal neuralgia, migraines.  ROS: Constitutional: no weight gain/+ weight loss, no fatigue, no subjective hyperthermia, no subjective hypothermia Eyes: no blurry vision, no xerophthalmia ENT: no sore throat, no nodules palpated in neck, no dysphagia, no odynophagia, no hoarseness Cardiovascular: no CP/no SOB/no palpitations/no leg swelling Respiratory: no cough/no SOB/no wheezing Gastrointestinal: no N/no V/no D/no C/no acid reflux Musculoskeletal: no muscle aches/+ joint aches (neck) Skin: no rashes, no  hair loss Neurological: no tremors/no numbness/no tingling/no dizziness  I reviewed pt's medications, allergies, PMH, social hx, family hx, and changes were documented in the history of present illness. Otherwise, unchanged from my initial visit note.  Past Medical History:  Diagnosis Date  . Diabetes mellitus   . Diverticulitis   . Fibromyalgia   . Hypercholesteremia   . Hypertension   . IBS (irritable bowel syndrome)   . Migraines   . Nasal fracture   . Stein-Leventhal ovaries    Past Surgical History:  Procedure Laterality Date  . CHOLECYSTECTOMY  1998  . KNEE SURGERY Right 2017  . LEFT HEART CATHETERIZATION WITH CORONARY ANGIOGRAM N/A 12/19/2011   Procedure: LEFT HEART CATHETERIZATION WITH CORONARY ANGIOGRAM;  Surgeon: Candee Furbish, MD;  Location: [facility] CATH LAB;  Service: Cardiovascular;  Laterality: N/A;  . SHOULDER SURGERY    . TONSILLECTOMY  1959   Social History   Socioeconomic History  . Marital status: Single    Spouse name: Not on file  . Number of children: 0  . Years of education: Some college  . Highest education level: Not on file  Occupational History  . Occupation: accounting    Comment: [employer]  Tobacco Use  . Smoking status: Never Smoker  . Smokeless tobacco: Never Used  Substance and Sexual Activity  . Alcohol use: No    Comment: 1/2 glass yearly  . Drug use: No  . Sexual activity: Not on file  Other Topics Concern  . Not on file  Social History Narrative   Lives alone w/ her 3 cats   Right-handed   Caffeine: 2 cups coffee/tea per day   Social Determinants of Health   Financial Resource Strain:   . Difficulty of Paying Living Expenses: Not on file  Food Insecurity:   . Worried About Charity fundraiser in the Last Year: Not on file  . Ran Out of Food in the Last Year: Not on file  Transportation Needs:   . Lack of Transportation (Medical): Not on file  . Lack of Transportation (Non-Medical): Not on file  Physical Activity:   .  Days of Exercise per Week: Not on file  . Minutes of Exercise per Session: Not on file  Stress:   . Feeling of Stress : Not on file  Social Connections:   . Frequency of Communication with Friends and Family: Not on file  . Frequency of Social Gatherings with Friends and Family: Not on file  . Attends Religious Services: Not on file  . Active Member of Clubs or Organizations: Not on file  . Attends Archivist Meetings: Not on file  . Marital Status: Not on file  Intimate Partner Violence:   . Fear of Current or Ex-Partner: Not on file  . Emotionally Abused: Not on file  . Physically Abused: Not on file  . Sexually Abused: Not on file   Current Outpatient Medications on File Prior to Visit  Medication Sig Dispense Refill  .  ACCU-CHEK SMARTVIEW test strip USE AS INSTRUCTED TO TEST TWICE DAILY 100 each 11  . acetaminophen-codeine (TYLENOL #3) 300-30 MG per tablet Take 1 tablet by mouth every 4 (four) hours as needed. pain    . aspirin EC 81 MG tablet Take 81 mg by mouth every evening.    . cholecalciferol (VITAMIN D) 1000 UNITS tablet Take 1,000 Units by mouth daily.    Marland Kitchen gabapentin (NEURONTIN) 100 MG capsule Take 1 capsule (100 mg total) by mouth 3 (three) times daily. 90 capsule 12  . hydrochlorothiazide (HYDRODIURIL) 25 MG tablet Take 12.5 mg by mouth daily.    Marland Kitchen HYDROcodone-acetaminophen (NORCO) 7.5-325 MG tablet Take 1 tablet by mouth every 6 (six) hours as needed for moderate pain.    Marland Kitchen insulin NPH Human (NOVOLIN N) 100 UNIT/ML injection Inject 18 units in am and 25 units in pm 10 mL 11  . insulin regular (NOVOLIN R RELION) 100 units/mL injection Inject 0.08-0.1 mLs (8-10 Units total) into the skin 2 (two) times daily before a meal. 10 mL 11  . Lancets (ACCU-CHEK MULTICLIX) lancets Use as instructed to test once daily DX E11.65 100 each 12  . metoprolol (LOPRESSOR) 50 MG tablet Take 25 mg by mouth 2 (two) times daily.  0  . PRESCRIPTION MEDICATION Patient receives a Steroid  injection in the neck once a year with Dr. Mina Marble at [facility] pt is unable to recall the exact name and i have been unable to verify name from the Dr's office. Patient also states " it shoots up my blood sugars really high, but i still get it because i can't take anything else for my neck pain"    . promethazine (PHENADOZ) 25 MG suppository Place 25 mg rectally once.    Marland Kitchen tiZANidine (ZANAFLEX) 4 MG tablet   0   Current Facility-Administered Medications on File Prior to Visit  Medication Dose Route Frequency Provider Last Rate Last Admin  . methylPREDNISolone sodium succinate (SOLU-MEDROL) 130 mg in sodium chloride 0.9 % 50 mL IVPB  130 mg Intravenous Once Jerline Pain, MD       Allergies  Allergen Reactions  . Contrast Media [Iodinated Diagnostic Agents] Hives  . Augmentin [Amoxicillin-Pot Clavulanate] Nausea And Vomiting    Nausea, vomiting, headache, dizziness   pt states she can take other Penicillins just not augmentin  . Basaglar Kwikpen [Insulin [place]   . Erythromycin   . Metformin And Related Diarrhea and Nausea And Vomiting    Violently ill for 2 yrs  . Nsaids Other (See Comments)    Stomach problems  . Prednisone Other (See Comments)    Abnormal blood sugars..  . Statins Other (See Comments)    Muscle pain/ weekness  . Sulfa Antibiotics Other (See Comments)    blackout  . Invokana [Canagliflozin] Rash and Other (See Comments)    Dehydration  . Tramadol Rash   Family History  Adopted: Yes  Problem Relation Age of Onset  . Failure to thrive Mother   . Heart failure Father   . Neuropathy Neg Hx    PE: There were no vitals taken for this visit.  Wt Readings from Last 3 Encounters:  10/26/18 189 lb (85.7 kg)  04/27/18 189 lb (85.7 kg)  10/16/17 197 lb (89.4 kg)   Constitutional:  in NAD  The physical exam was not performed (telephone visit).  ASSESSMENT: 1. DM2, insulin-dependent, uncontrolled, without long-term complications, but with  hyperglycemia  + hypertensive retinopathy  2. HL  3.  Weight gain  PLAN:  1. Patient with longstanding, uncontrolled, type 2 diabetes, on basal-bolus insulin regimen with NPH and regular insulin due to price.  She could not tolerate Basaglar in the past and could not afford GLP-1 receptor agonist.  In 2019 we stopped Actos due to improved blood sugars and also significant weight gain and LE edema.  She did lose weight and her edema decreased afterwards. -At last visit, she was still mentioning erratic schedule and forgetting insulin doses.  Also, she did not introduce lunchtime regular insulin, as discussed at the previous visit.  She was not checking sugars before dinner and I advised her to start doing so.  We increased her insulin doses at that time, however, since during the weekend her sugars were better as she was staying more active, I advised her to take less NPH and regular insulin. -Since last visit, sugars improved despite using lower doses of regular insulin, usually 15 to 20 units per meal and not injecting insulin with lunch.  Her sugars in the morning are much better, almost all at target, but they are higher before lunch.  She usually has a snack in the middle of the afternoon and this may be the reason for the increased blood sugars before dinner.  We discussed to continue with the current doses and sugars improved but to use higher doses of regular insulin (25 units) and also to inject insulin before lunch after her steroid injection if the sugars increase - I suggested to:  Patient Instructions   Please continue: Insulin Before breakfast Before lunch Before dinner  Regular 15-20 (25) (8-10) 15-20 (25)  NPH 20-25  25  Please inject the insulin 30 min before meals.  Please return in 4 months with your sugar log.   - we will recheck her HbA1c when she returns to the clinic - advised to check sugars at different times of the day - 3-4x a day, rotating check times - advised for  yearly eye exams >> she is UTD - return to clinic in 3-4 months   2. HL -Reviewed latest lipid panel from 01/2018: LDL high, but improved -She continues off statins due to previous side effects with several of them  3.  Weight gain -We stopped Actos in the past, which was conducive to weight gain -Unfortunately, we need to continue on insulin, which is also conducive to weight gain - However, she was able to lose 5 lbs since last OV  - time spent with the patient: 13 min, of which >50% was spent in obtaining information about her symptoms, reviewing her previous labs, evaluations, and treatments, counseling her about her conditions (please see the discussed topics above), and developing a plan to further investigate and treat them; she had a number of questions which I addressed.   Philemon Kingdom, MD PhD [facility] Endocrinology

## 2019-02-08 DIAGNOSIS — S83242D Other tear of medial meniscus, current injury, left knee, subsequent encounter: Secondary | ICD-10-CM | POA: Diagnosis not present

## 2019-02-08 DIAGNOSIS — M25662 Stiffness of left knee, not elsewhere classified: Secondary | ICD-10-CM | POA: Diagnosis not present

## 2019-02-15 DIAGNOSIS — M5412 Radiculopathy, cervical region: Secondary | ICD-10-CM | POA: Diagnosis not present

## 2019-02-22 DIAGNOSIS — S83242D Other tear of medial meniscus, current injury, left knee, subsequent encounter: Secondary | ICD-10-CM | POA: Diagnosis not present

## 2019-02-22 DIAGNOSIS — M25662 Stiffness of left knee, not elsewhere classified: Secondary | ICD-10-CM | POA: Diagnosis not present

## 2019-02-25 DIAGNOSIS — M5412 Radiculopathy, cervical region: Secondary | ICD-10-CM | POA: Diagnosis not present

## 2019-03-15 DIAGNOSIS — M545 Low back pain: Secondary | ICD-10-CM | POA: Diagnosis not present

## 2019-03-20 ENCOUNTER — Ambulatory Visit: Payer: Medicare Other

## 2019-03-21 ENCOUNTER — Ambulatory Visit: Payer: Medicare Other | Attending: Internal Medicine

## 2019-03-21 DIAGNOSIS — Z23 Encounter for immunization: Secondary | ICD-10-CM | POA: Insufficient documentation

## 2019-03-21 NOTE — Progress Notes (Signed)
   Covid-19 Vaccination Clinic  Name:  [name]    MRN: [id number] DOB: November 24, 1950  03/21/2019  [name] was observed post Covid-19 immunization for 15 minutes without incidence. She was provided with Vaccine Information Sheet and instruction to access the V-Safe system.   [name] was instructed to call 911 with any severe reactions post vaccine: Marland Kitchen Difficulty breathing  . Swelling of your face and throat  . A fast heartbeat  . A bad rash all over your body  . Dizziness and weakness    Immunizations Administered    Name Date Dose VIS Date Route   Pfizer COVID-19 Vaccine 03/21/2019 10:56 AM 0.3 mL 01/15/2019 Intramuscular   Manufacturer: [place]   Lot: [id number]   [place]: [id number]

## 2019-04-13 ENCOUNTER — Ambulatory Visit: Payer: Medicare Other | Attending: Internal Medicine

## 2019-04-13 DIAGNOSIS — Z23 Encounter for immunization: Secondary | ICD-10-CM | POA: Insufficient documentation

## 2019-04-13 NOTE — Progress Notes (Signed)
   Covid-19 Vaccination Clinic  Name:  [name]    MRN: [id number] DOB: 07/07/1950  04/13/2019  [name] was observed post Covid-19 immunization for 15 minutes without incident. She was provided with Vaccine Information Sheet and instruction to access the V-Safe system.   [name] was instructed to call 911 with any severe reactions post vaccine: Marland Kitchen Difficulty breathing  . Swelling of face and throat  . A fast heartbeat  . A bad rash all over body  . Dizziness and weakness   Immunizations Administered    Name Date Dose VIS Date Route   Pfizer COVID-19 Vaccine 04/13/2019  5:20 PM 0.3 mL 01/15/2019 Intramuscular   Manufacturer: [place]   Lot: [id number]   [place]: [id number]

## 2019-04-27 DIAGNOSIS — H524 Presbyopia: Secondary | ICD-10-CM | POA: Diagnosis not present

## 2019-04-27 DIAGNOSIS — H5213 Myopia, bilateral: Secondary | ICD-10-CM | POA: Diagnosis not present

## 2019-04-27 DIAGNOSIS — E119 Type 2 diabetes mellitus without complications: Secondary | ICD-10-CM | POA: Diagnosis not present

## 2019-04-27 DIAGNOSIS — H2513 Age-related nuclear cataract, bilateral: Secondary | ICD-10-CM | POA: Diagnosis not present

## 2019-04-27 DIAGNOSIS — H04123 Dry eye syndrome of bilateral lacrimal glands: Secondary | ICD-10-CM | POA: Diagnosis not present

## 2019-04-27 DIAGNOSIS — H52223 Regular astigmatism, bilateral: Secondary | ICD-10-CM | POA: Diagnosis not present

## 2019-06-01 DIAGNOSIS — Z794 Long term (current) use of insulin: Secondary | ICD-10-CM | POA: Diagnosis not present

## 2019-06-01 DIAGNOSIS — E559 Vitamin D deficiency, unspecified: Secondary | ICD-10-CM | POA: Diagnosis not present

## 2019-06-01 DIAGNOSIS — E1165 Type 2 diabetes mellitus with hyperglycemia: Secondary | ICD-10-CM | POA: Diagnosis not present

## 2019-06-01 DIAGNOSIS — F419 Anxiety disorder, unspecified: Secondary | ICD-10-CM | POA: Diagnosis not present

## 2019-06-01 DIAGNOSIS — M797 Fibromyalgia: Secondary | ICD-10-CM | POA: Diagnosis not present

## 2019-06-01 DIAGNOSIS — M5030 Other cervical disc degeneration, unspecified cervical region: Secondary | ICD-10-CM | POA: Diagnosis not present

## 2019-06-01 DIAGNOSIS — J302 Other seasonal allergic rhinitis: Secondary | ICD-10-CM | POA: Diagnosis not present

## 2019-06-01 DIAGNOSIS — E7800 Pure hypercholesterolemia, unspecified: Secondary | ICD-10-CM | POA: Diagnosis not present

## 2019-06-01 DIAGNOSIS — I10 Essential (primary) hypertension: Secondary | ICD-10-CM | POA: Diagnosis not present

## 2019-06-03 ENCOUNTER — Encounter: Payer: Self-pay | Admitting: Internal Medicine

## 2019-06-03 NOTE — Progress Notes (Signed)
Received labs from 06/01/2019 from Dr. Drema Dallas: CMP normal, exc. Glu 178. BUN/Cr 21/0.87, GFR 64 Lipids: 273/165/52/151

## 2019-07-06 ENCOUNTER — Other Ambulatory Visit: Payer: Self-pay

## 2019-07-06 ENCOUNTER — Emergency Department (HOSPITAL_COMMUNITY): Payer: Medicare Other

## 2019-07-06 ENCOUNTER — Emergency Department (HOSPITAL_COMMUNITY)
Admission: EM | Admit: 2019-07-06 | Discharge: 2019-07-06 | Disposition: A | Discharge status: Home / Self Care | Payer: Medicare Other | Attending: Emergency Medicine | Admitting: Emergency Medicine

## 2019-07-06 ENCOUNTER — Encounter (HOSPITAL_COMMUNITY): Payer: Self-pay

## 2019-07-06 DIAGNOSIS — I10 Essential (primary) hypertension: Secondary | ICD-10-CM | POA: Insufficient documentation

## 2019-07-06 DIAGNOSIS — K5792 Diverticulitis of intestine, part unspecified, without perforation or abscess without bleeding: Secondary | ICD-10-CM | POA: Insufficient documentation

## 2019-07-06 DIAGNOSIS — Z794 Long term (current) use of insulin: Secondary | ICD-10-CM | POA: Insufficient documentation

## 2019-07-06 DIAGNOSIS — E119 Type 2 diabetes mellitus without complications: Secondary | ICD-10-CM | POA: Insufficient documentation

## 2019-07-06 DIAGNOSIS — Z79899 Other long term (current) drug therapy: Secondary | ICD-10-CM | POA: Diagnosis not present

## 2019-07-06 DIAGNOSIS — K5730 Diverticulosis of large intestine without perforation or abscess without bleeding: Secondary | ICD-10-CM | POA: Diagnosis not present

## 2019-07-06 DIAGNOSIS — R109 Unspecified abdominal pain: Secondary | ICD-10-CM | POA: Diagnosis present

## 2019-07-06 LAB — CBC
HCT: 43.8 % (ref 36.0–46.0)
Hemoglobin: 14.1 g/dL (ref 12.0–15.0)
MCH: 30.4 pg (ref 26.0–34.0)
MCHC: 32.2 g/dL (ref 30.0–36.0)
MCV: 94.4 fL (ref 80.0–100.0)
Platelets: 266 10*3/uL (ref 150–400)
RBC: 4.64 MIL/uL (ref 3.87–5.11)
RDW: 12.9 % (ref 11.5–15.5)
WBC: 13.7 10*3/uL — ABNORMAL HIGH (ref 4.0–10.5)
nRBC: 0 % (ref 0.0–0.2)

## 2019-07-06 LAB — COMPREHENSIVE METABOLIC PANEL
ALT: 13 U/L (ref 0–44)
AST: 15 U/L (ref 15–41)
Albumin: 3.9 g/dL (ref 3.5–5.0)
Alkaline Phosphatase: 103 U/L (ref 38–126)
Anion gap: 12 (ref 5–15)
BUN: 19 mg/dL (ref 8–23)
CO2: 27 mmol/L (ref 22–32)
Calcium: 9.2 mg/dL (ref 8.9–10.3)
Chloride: 102 mmol/L (ref 98–111)
Creatinine, Ser: 0.93 mg/dL (ref 0.44–1.00)
GFR calc Af Amer: >60 mL/min (ref >60)
GFR calc non Af Amer: >60 mL/min (ref >60)
Glucose, Bld: 208 mg/dL — ABNORMAL HIGH (ref 70–99)
Potassium: 3.9 mmol/L (ref 3.5–5.1)
Sodium: 141 mmol/L (ref 135–145)
Total Bilirubin: 0.8 mg/dL (ref 0.3–1.2)
Total Protein: 7.6 g/dL (ref 6.5–8.1)

## 2019-07-06 LAB — URINALYSIS, ROUTINE W REFLEX MICROSCOPIC
Bilirubin Urine: NEGATIVE
Glucose, UA: NEGATIVE mg/dL
Hgb urine dipstick: NEGATIVE
Ketones, ur: NEGATIVE mg/dL
Leukocytes,Ua: NEGATIVE
Nitrite: NEGATIVE
Protein, ur: NEGATIVE mg/dL
Specific Gravity, Urine: 1.024 (ref 1.005–1.030)
pH: 5 (ref 5.0–8.0)

## 2019-07-06 LAB — LIPASE, BLOOD: Lipase: 23 U/L (ref 11–51)

## 2019-07-06 MED ORDER — METRONIDAZOLE 500 MG PO TABS
500.0000 mg | ORAL_TABLET | Freq: Two times a day (BID) | ORAL | 0 refills | Status: AC
Start: 2019-07-06 — End: 2019-07-16

## 2019-07-06 MED ORDER — SODIUM CHLORIDE 0.9% FLUSH: 3.0000 mL | Freq: Once | INTRAVENOUS | Status: DC

## 2019-07-06 MED ORDER — CIPROFLOXACIN HCL 500 MG PO TABS
500.0000 mg | ORAL_TABLET | Freq: Two times a day (BID) | ORAL | 0 refills | Status: AC
Start: 2019-07-06 — End: 2019-07-16

## 2019-07-06 MED ORDER — PROMETHAZINE HCL 25 MG PO TABS
25.0000 mg | ORAL_TABLET | Freq: Four times a day (QID) | ORAL | 0 refills | Status: DC | PRN
Start: 2019-07-06 — End: 2019-08-02

## 2019-07-06 NOTE — ED Provider Notes (Signed)
[place] DEPT Provider Note   CSN: [id number] Arrival date & time: 07/06/19  1359     History Chief Complaint  Patient presents with   Abdominal Pain   Emesis   Diarrhea    [name] is a 69 y.o. female.  HPI Patient with a history of kidney stones and diverticulitis reports she was in her normal state of health until around noon today when she had sudden onset of moderate to severe aching R flank and diffuse lower abdominal pain, associated with nausea and multiple episodes of vomiting. She states the pain comes and goes in waves, similar to previous kidney stones, not like diverticulitis. She reports one episode of diarrhea after arrival to ED. She is currently pain free.     Past Medical History:  Diagnosis Date   Diabetes mellitus    Diverticulitis    Fibromyalgia    Hypercholesteremia    Hypertension    IBS (irritable bowel syndrome)    Migraines    Nasal fracture    Stein-Leventhal ovaries     Patient Active Problem List   Diagnosis Date Noted   Hyperlipidemia 05/12/2017   Trigeminal neuralgia of right side of face 01/06/2017   Other nonspecific abnormal cardiovascular system function study 12/19/2011   Type 2 diabetes mellitus with hyperglycemia, with long-term current use of insulin ([place]) 12/19/2011    Past Surgical History:  Procedure Laterality Date   CHOLECYSTECTOMY  1998   KNEE SURGERY Right 2017   LEFT HEART CATHETERIZATION WITH CORONARY ANGIOGRAM N/A 12/19/2011   Procedure: LEFT HEART CATHETERIZATION WITH CORONARY ANGIOGRAM;  Surgeon: Candee Furbish, MD;  Location: [facility] CATH LAB;  Service: Cardiovascular;  Laterality: N/A;   SHOULDER SURGERY     TONSILLECTOMY  1959     OB History   No obstetric history on file.     Family History  Adopted: Yes  Problem Relation Age of Onset   Failure to thrive Mother    Heart failure Father    Neuropathy Neg Hx     Social History   Tobacco Use     Smoking status: Never Smoker   Smokeless tobacco: Never Used  Substance Use Topics   Alcohol use: Yes    Comment: rare   Drug use: No    Home Medications Prior to Admission medications   Medication Sig Start Date End Date Taking? Authorizing Provider  acetaminophen (TYLENOL) 650 MG CR tablet Take 650 mg by mouth every 8 (eight) hours as needed for pain.   Yes [provider]  acetaminophen-codeine (TYLENOL #3) 300-30 MG per tablet Take 1 tablet by mouth every 4 (four) hours as needed for moderate pain or severe pain. pain    Yes [provider]  cholecalciferol (VITAMIN D) 1000 UNITS tablet Take 1,000 Units by mouth every evening.    Yes [provider]  fluticasone (FLONASE) 50 MCG/ACT nasal spray Place 1 spray into both nostrils daily. 06/01/19  Yes [provider]  hydrochlorothiazide (HYDRODIURIL) 25 MG tablet Take 12.5 mg by mouth daily.   Yes [provider]  HYDROcodone-acetaminophen (NORCO) 7.5-325 MG tablet Take 1 tablet by mouth every 6 (six) hours as needed for moderate pain.   Yes [provider]  ibuprofen (ADVIL) 200 MG tablet Take 200 mg by mouth every 6 (six) hours as needed for moderate pain.   Yes [provider]  insulin NPH Human (NOVOLIN N) 100 UNIT/ML injection Inject 18 units in am and 25 units in pm  Patient taking differently: Inject 20 Units into the skin 2 (two) times daily before a meal.  10/16/17  Yes Philemon Kingdom, MD  insulin regular (NOVOLIN R RELION) 100 units/mL injection Inject 0.08-0.1 mLs (8-10 Units total) into the skin 2 (two) times daily before a meal. Patient taking differently: Inject 20 Units into the skin 2 (two) times daily before a meal.  05/12/17  Yes Philemon Kingdom, MD  metoprolol (LOPRESSOR) 50 MG tablet Take 25 mg by mouth 2 (two) times daily. 06/20/15  Yes [provider]  tiZANidine (ZANAFLEX) 4 MG tablet Take 4 mg by mouth every 6 (six) hours as needed for muscle  spasms.   Yes [provider]  ACCU-CHEK SMARTVIEW test strip USE AS INSTRUCTED TO TEST TWICE DAILY 01/13/19   Philemon Kingdom, MD  ciprofloxacin (CIPRO) 500 MG tablet Take 1 tablet (500 mg total) by mouth 2 (two) times daily for 10 days. 07/06/19 07/16/19  Truddie Hidden, MD  Lancets (ACCU-CHEK MULTICLIX) lancets Use as instructed to test once daily DX E11.65 08/11/17   Philemon Kingdom, MD  metroNIDAZOLE (FLAGYL) 500 MG tablet Take 1 tablet (500 mg total) by mouth 2 (two) times daily for 10 days. 07/06/19 07/16/19  Truddie Hidden, MD  PRESCRIPTION MEDICATION Patient receives a Steroid injection in the neck once a year with Dr. Mina Marble at [facility] pt is unable to recall the exact name and i have been unable to verify name from the Dr's office. Patient also states " it shoots up my blood sugars really high, but i still get it because i can't take anything else for my neck pain"    [provider]  promethazine (PHENERGAN) 25 MG tablet Take 1 tablet (25 mg total) by mouth every 6 (six) hours as needed for nausea. 07/06/19   Truddie Hidden, MD  gabapentin (NEURONTIN) 100 MG capsule Take 1 capsule (100 mg total) by mouth 3 (three) times daily. Patient not taking: Reported on 07/06/2019 07/07/17 07/06/19  Melvenia Beam, MD    Allergies    Contrast media [iodinated diagnostic agents], Augmentin [amoxicillin-pot clavulanate], Basaglar kwikpen [insulin glargine], Erythromycin, Metformin and related, Nsaids, Prednisone, Statins, Sulfa antibiotics, Invokana [canagliflozin], and Tramadol  Review of Systems   Review of Systems  Constitutional: Negative for fever.  HENT: Negative for congestion and sore throat.   Respiratory: Negative for cough and shortness of breath.   Cardiovascular: Negative for chest pain.  Gastrointestinal: Positive for abdominal pain, diarrhea, nausea and vomiting.  Genitourinary: Positive for flank pain. Negative for dysuria.  Musculoskeletal: Negative  for myalgias.  Skin: Negative for rash.  Neurological: Negative for headaches.  Psychiatric/Behavioral: Negative for behavioral problems.    Physical Exam Updated Vital Signs BP 135/71 (BP Location: Right Arm)    Pulse 77    Temp 98.1 F (36.7 C) (Oral)    Resp 16    Ht 5\' 5"  (1.651 m)    Wt 87.1 kg    SpO2 100%    BMI 31.95 kg/m   Physical Exam Vitals and nursing note reviewed.  Constitutional:      Appearance: Normal appearance.  HENT:     Head: Normocephalic and atraumatic.     Nose: Nose normal.     Mouth/Throat:     Mouth: Mucous membranes are moist.  Eyes:     Extraocular Movements: Extraocular movements intact.     Conjunctiva/sclera: Conjunctivae normal.  Cardiovascular:     Rate and Rhythm: Normal rate.  Pulmonary:  Effort: Pulmonary effort is normal.     Breath sounds: Normal breath sounds.  Abdominal:     General: Abdomen is flat.     Palpations: Abdomen is soft.     Tenderness: There is no abdominal tenderness. There is no guarding. Negative signs include Murphy's sign and McBurney's sign.  Musculoskeletal:        General: No swelling. Normal range of motion.     Cervical back: Neck supple.  Skin:    General: Skin is warm and dry.  Neurological:     General: No focal deficit present.     Mental Status: She is alert.  Psychiatric:        Mood and Affect: Mood normal.     ED Results / Procedures / Treatments   Labs (all labs ordered are listed, but only abnormal results are displayed) Labs Reviewed  COMPREHENSIVE METABOLIC PANEL - Abnormal; Notable for the following components:      Result Value   Glucose, Bld 208 (*)    All other components within normal limits  CBC - Abnormal; Notable for the following components:   WBC 13.7 (*)    All other components within normal limits  LIPASE, BLOOD  URINALYSIS, ROUTINE W REFLEX MICROSCOPIC    EKG None  Radiology CT Renal Stone Study  Result Date: 07/06/2019 CLINICAL DATA:  Flank pain EXAM: CT  ABDOMEN AND PELVIS WITHOUT CONTRAST TECHNIQUE: Multidetector CT imaging of the abdomen and pelvis was performed following the standard protocol without IV contrast. COMPARISON:  None. FINDINGS: Lower chest: Lung bases are clear. No effusions. Heart is normal size. Hepatobiliary: No focal liver abnormality is seen. Status post cholecystectomy. No biliary dilatation. Pancreas: No focal abnormality or ductal dilatation. Spleen: No focal abnormality.  Normal size. Adrenals/Urinary Tract: No adrenal abnormality. No focal renal abnormality. No stones or hydronephrosis. Urinary bladder is unremarkable. Stomach/Bowel: Sigmoid diverticulosis. Inflammatory stranding around the mid sigmoid colon compatible with active diverticulitis. Stomach and small bowel decompressed, unremarkable. Normal appendix. Vascular/Lymphatic: Aortic atherosclerosis. No enlarged abdominal or pelvic lymph nodes. Reproductive: Uterus and adnexa unremarkable.  No mass. Other: No free fluid or free air. Musculoskeletal: No acute bony abnormality. Degenerative changes in the lumbar spine. IMPRESSION: No renal or ureteral stones.  No hydronephrosis. Sigmoid diverticulosis with surrounding inflammation compatible with active diverticulitis. Aortic atherosclerosis. Electronically Signed   By: Rolm Baptise M.D.   On: 07/06/2019 19:09    Procedures Procedures (including critical care time)  Medications Ordered in ED Medications - No data to display  ED Course  I have reviewed the triage vital signs and the nursing notes.  Pertinent labs & imaging results that were available during my care of the patient were reviewed by me and considered in my medical decision making (see chart for details).  Clinical Course as of Jul 06 2043  Tue Jul 06, 2019  1638 Bloodwork shows mild leukocytosis, hyperglycemia, normal kidney function, lipase. Awaiting CT and UA.    [CS]  1928 UA negative. CT shows diverticulitis, no renal stones. Patient is well  appearing, pain/nausea controlled. Will d/c with Cipro/Flagyl and PCP followup. RTED for any worsening pain, fever, vomiting or other concerns.    [CS]    Clinical Course User Index [CS] Truddie Hidden, MD   MDM Rules/Calculators/A&P                      Patient with waxing and waning R flank and lower abdominal pain. Will check labs and CT  stone study. Currently asymptomatic.  Final Clinical Impression(s) / ED Diagnoses Final diagnoses:  Diverticulitis    Rx / DC Orders ED Discharge Orders         Ordered    ciprofloxacin (CIPRO) 500 MG tablet  2 times daily     07/06/19 1931    promethazine (PHENERGAN) 25 MG tablet  Every 6 hours PRN     07/06/19 1931    metroNIDAZOLE (FLAGYL) 500 MG tablet  2 times daily     07/06/19 1931           Truddie Hidden, MD 07/06/19 2045

## 2019-07-06 NOTE — ED Triage Notes (Signed)
Patient c/o abdominal pain, N/v/D since early this AM.

## 2019-07-20 DIAGNOSIS — Z8719 Personal history of other diseases of the digestive system: Secondary | ICD-10-CM | POA: Diagnosis not present

## 2019-07-20 DIAGNOSIS — I10 Essential (primary) hypertension: Secondary | ICD-10-CM | POA: Diagnosis not present

## 2019-08-02 ENCOUNTER — Encounter (HOSPITAL_COMMUNITY): Payer: Self-pay | Admitting: Emergency Medicine

## 2019-08-02 ENCOUNTER — Other Ambulatory Visit: Payer: Self-pay

## 2019-08-02 ENCOUNTER — Ambulatory Visit: Payer: Medicare Other | Admitting: Internal Medicine

## 2019-08-02 ENCOUNTER — Emergency Department (HOSPITAL_COMMUNITY): Payer: Medicare Other

## 2019-08-02 ENCOUNTER — Inpatient Hospital Stay (HOSPITAL_COMMUNITY)
Admission: EM | Admit: 2019-08-02 | Discharge: 2019-08-05 | DRG: 638 | Disposition: A | Discharge status: Home / Self Care | Payer: Medicare Other | Attending: Internal Medicine | Admitting: Internal Medicine

## 2019-08-02 DIAGNOSIS — E1165 Type 2 diabetes mellitus with hyperglycemia: Secondary | ICD-10-CM | POA: Diagnosis present

## 2019-08-02 DIAGNOSIS — Z91138 Patient's unintentional underdosing of medication regimen for other reason: Secondary | ICD-10-CM

## 2019-08-02 DIAGNOSIS — A09 Infectious gastroenteritis and colitis, unspecified: Secondary | ICD-10-CM | POA: Diagnosis present

## 2019-08-02 DIAGNOSIS — K5792 Diverticulitis of intestine, part unspecified, without perforation or abscess without bleeding: Secondary | ICD-10-CM

## 2019-08-02 DIAGNOSIS — Z20822 Contact with and (suspected) exposure to covid-19: Secondary | ICD-10-CM | POA: Diagnosis present

## 2019-08-02 DIAGNOSIS — R197 Diarrhea, unspecified: Secondary | ICD-10-CM

## 2019-08-02 DIAGNOSIS — Z9049 Acquired absence of other specified parts of digestive tract: Secondary | ICD-10-CM

## 2019-08-02 DIAGNOSIS — Z794 Long term (current) use of insulin: Secondary | ICD-10-CM

## 2019-08-02 DIAGNOSIS — A0472 Enterocolitis due to Clostridium difficile, not specified as recurrent: Secondary | ICD-10-CM

## 2019-08-02 DIAGNOSIS — Z885 Allergy status to narcotic agent status: Secondary | ICD-10-CM

## 2019-08-02 DIAGNOSIS — E876 Hypokalemia: Secondary | ICD-10-CM | POA: Diagnosis present

## 2019-08-02 DIAGNOSIS — Z8249 Family history of ischemic heart disease and other diseases of the circulatory system: Secondary | ICD-10-CM | POA: Diagnosis not present

## 2019-08-02 DIAGNOSIS — K219 Gastro-esophageal reflux disease without esophagitis: Secondary | ICD-10-CM | POA: Diagnosis present

## 2019-08-02 DIAGNOSIS — K5732 Diverticulitis of large intestine without perforation or abscess without bleeding: Secondary | ICD-10-CM | POA: Diagnosis present

## 2019-08-02 DIAGNOSIS — Z79899 Other long term (current) drug therapy: Secondary | ICD-10-CM

## 2019-08-02 DIAGNOSIS — Z881 Allergy status to other antibiotic agents status: Secondary | ICD-10-CM

## 2019-08-02 DIAGNOSIS — Z87442 Personal history of urinary calculi: Secondary | ICD-10-CM

## 2019-08-02 DIAGNOSIS — E860 Dehydration: Secondary | ICD-10-CM | POA: Diagnosis present

## 2019-08-02 DIAGNOSIS — T361X5A Adverse effect of cephalosporins and other beta-lactam antibiotics, initial encounter: Secondary | ICD-10-CM | POA: Diagnosis not present

## 2019-08-02 DIAGNOSIS — T373X5A Adverse effect of other antiprotozoal drugs, initial encounter: Secondary | ICD-10-CM | POA: Diagnosis not present

## 2019-08-02 DIAGNOSIS — E1110 Type 2 diabetes mellitus with ketoacidosis without coma: Secondary | ICD-10-CM

## 2019-08-02 DIAGNOSIS — T383X6A Underdosing of insulin and oral hypoglycemic [antidiabetic] drugs, initial encounter: Secondary | ICD-10-CM | POA: Diagnosis present

## 2019-08-02 DIAGNOSIS — L270 Generalized skin eruption due to drugs and medicaments taken internally: Secondary | ICD-10-CM | POA: Diagnosis not present

## 2019-08-02 DIAGNOSIS — Z882 Allergy status to sulfonamides status: Secondary | ICD-10-CM | POA: Diagnosis not present

## 2019-08-02 DIAGNOSIS — Z888 Allergy status to other drugs, medicaments and biological substances status: Secondary | ICD-10-CM

## 2019-08-02 DIAGNOSIS — M797 Fibromyalgia: Secondary | ICD-10-CM | POA: Diagnosis present

## 2019-08-02 DIAGNOSIS — K5100 Ulcerative (chronic) pancolitis without complications: Secondary | ICD-10-CM

## 2019-08-02 DIAGNOSIS — I10 Essential (primary) hypertension: Secondary | ICD-10-CM

## 2019-08-02 DIAGNOSIS — Z886 Allergy status to analgesic agent status: Secondary | ICD-10-CM

## 2019-08-02 LAB — COMPREHENSIVE METABOLIC PANEL
ALT: 15 U/L (ref 0–44)
AST: 14 U/L — ABNORMAL LOW (ref 15–41)
Albumin: 3.2 g/dL — ABNORMAL LOW (ref 3.5–5.0)
Alkaline Phosphatase: 95 U/L (ref 38–126)
Anion gap: 18 — ABNORMAL HIGH (ref 5–15)
BUN: 35 mg/dL — ABNORMAL HIGH (ref 8–23)
CO2: 20 mmol/L — ABNORMAL LOW (ref 22–32)
Calcium: 8.9 mg/dL (ref 8.9–10.3)
Chloride: 95 mmol/L — ABNORMAL LOW (ref 98–111)
Creatinine, Ser: 1.23 mg/dL — ABNORMAL HIGH (ref 0.44–1.00)
GFR calc Af Amer: 51 mL/min — ABNORMAL LOW (ref >60)
GFR calc non Af Amer: 44 mL/min — ABNORMAL LOW (ref >60)
Glucose, Bld: 460 mg/dL — ABNORMAL HIGH (ref 70–99)
Potassium: 3.9 mmol/L (ref 3.5–5.1)
Sodium: 133 mmol/L — ABNORMAL LOW (ref 135–145)
Total Bilirubin: 0.8 mg/dL (ref 0.3–1.2)
Total Protein: 7.6 g/dL (ref 6.5–8.1)

## 2019-08-02 LAB — URINALYSIS, ROUTINE W REFLEX MICROSCOPIC
Bilirubin Urine: NEGATIVE
Glucose, UA: >=500 mg/dL — AB
Ketones, ur: 80 mg/dL — AB
Nitrite: NEGATIVE
Protein, ur: NEGATIVE mg/dL
Specific Gravity, Urine: 1.029 (ref 1.005–1.030)
pH: 6 (ref 5.0–8.0)

## 2019-08-02 LAB — LIPASE, BLOOD: Lipase: 21 U/L (ref 11–51)

## 2019-08-02 LAB — CBC WITH DIFFERENTIAL/PLATELET
Abs Immature Granulocytes: 0.57 10*3/uL — ABNORMAL HIGH (ref 0.00–0.07)
Basophils Absolute: 0.2 10*3/uL — ABNORMAL HIGH (ref 0.0–0.1)
Basophils Relative: 1 %
Eosinophils Absolute: 0 10*3/uL (ref 0.0–0.5)
Eosinophils Relative: 0 %
HCT: 42.6 % (ref 36.0–46.0)
Hemoglobin: 14.5 g/dL (ref 12.0–15.0)
Immature Granulocytes: 3 %
Lymphocytes Relative: 8 %
Lymphs Abs: 1.9 10*3/uL (ref 0.7–4.0)
MCH: 30.4 pg (ref 26.0–34.0)
MCHC: 34 g/dL (ref 30.0–36.0)
MCV: 89.3 fL (ref 80.0–100.0)
Monocytes Absolute: 1.8 10*3/uL — ABNORMAL HIGH (ref 0.1–1.0)
Monocytes Relative: 8 %
Neutro Abs: 18.5 10*3/uL — ABNORMAL HIGH (ref 1.7–7.7)
Neutrophils Relative %: 80 %
Platelets: 341 10*3/uL (ref 150–400)
RBC: 4.77 MIL/uL (ref 3.87–5.11)
RDW: 12.9 % (ref 11.5–15.5)
WBC: 22.9 10*3/uL — ABNORMAL HIGH (ref 4.0–10.5)
nRBC: 0 % (ref 0.0–0.2)

## 2019-08-02 LAB — C DIFFICILE QUICK SCREEN W PCR REFLEX
C Diff antigen: NEGATIVE
C Diff interpretation: NOT DETECTED
C Diff toxin: NEGATIVE

## 2019-08-02 LAB — BASIC METABOLIC PANEL
Anion gap: 13 (ref 5–15)
BUN: 31 mg/dL — ABNORMAL HIGH (ref 8–23)
CO2: 22 mmol/L (ref 22–32)
Calcium: 8.4 mg/dL — ABNORMAL LOW (ref 8.9–10.3)
Chloride: 101 mmol/L (ref 98–111)
Creatinine, Ser: 0.85 mg/dL (ref 0.44–1.00)
GFR calc Af Amer: >60 mL/min (ref >60)
GFR calc non Af Amer: >60 mL/min (ref >60)
Glucose, Bld: 320 mg/dL — ABNORMAL HIGH (ref 70–99)
Potassium: 3.5 mmol/L (ref 3.5–5.1)
Sodium: 136 mmol/L (ref 135–145)

## 2019-08-02 LAB — HEMOGLOBIN A1C
Hgb A1c MFr Bld: 9 % — ABNORMAL HIGH (ref 4.8–5.6)
Mean Plasma Glucose: 211.6 mg/dL

## 2019-08-02 LAB — GLUCOSE, CAPILLARY
Glucose-Capillary: 208 mg/dL — ABNORMAL HIGH (ref 70–99)
Glucose-Capillary: 245 mg/dL — ABNORMAL HIGH (ref 70–99)

## 2019-08-02 LAB — CBG MONITORING, ED
Glucose-Capillary: 365 mg/dL — ABNORMAL HIGH (ref 70–99)
Glucose-Capillary: 313 mg/dL — ABNORMAL HIGH (ref 70–99)
Glucose-Capillary: 288 mg/dL — ABNORMAL HIGH (ref 70–99)

## 2019-08-02 LAB — TROPONIN I (HIGH SENSITIVITY)
Troponin I (High Sensitivity): 7 ng/L (ref <18)
Troponin I (High Sensitivity): 8 ng/L (ref <18)

## 2019-08-02 LAB — SARS CORONAVIRUS 2 BY RT PCR (HOSPITAL ORDER, PERFORMED IN ~~LOC~~ HOSPITAL LAB): SARS Coronavirus 2: NEGATIVE

## 2019-08-02 MED ORDER — SODIUM CHLORIDE 0.9 % IV SOLN: INTRAVENOUS | Status: AC

## 2019-08-02 MED ORDER — TIZANIDINE HCL 4 MG PO TABS: 4.0000 mg | ORAL_TABLET | Freq: Every evening | ORAL | Status: DC | PRN

## 2019-08-02 MED ORDER — FAMOTIDINE IN NACL 20-0.9 MG/50ML-% IV SOLN
20.0000 mg | Freq: Once | INTRAVENOUS | Status: AC
  Administered 2019-08-02: 20 mg via INTRAVENOUS
  Filled 2019-08-02: qty 50

## 2019-08-02 MED ORDER — HEPARIN SODIUM (PORCINE) 5000 UNIT/ML IJ SOLN
5000.0000 [IU] | Freq: Three times a day (TID) | INTRAMUSCULAR | Status: DC
  Administered 2019-08-02 – 2019-08-04 (×6): 5000 [IU] via SUBCUTANEOUS
  Filled 2019-08-02 (×6): qty 1

## 2019-08-02 MED ORDER — METRONIDAZOLE IN NACL 5-0.79 MG/ML-% IV SOLN
500.0000 mg | Freq: Three times a day (TID) | INTRAVENOUS | Status: DC
  Administered 2019-08-02 – 2019-08-04 (×5): 500 mg via INTRAVENOUS
  Filled 2019-08-02 (×5): qty 100

## 2019-08-02 MED ORDER — FENTANYL CITRATE (PF) 100 MCG/2ML IJ SOLN
50.0000 ug | Freq: Once | INTRAMUSCULAR | Status: AC
  Administered 2019-08-02: 50 ug via INTRAVENOUS
  Filled 2019-08-02: qty 2

## 2019-08-02 MED ORDER — ACETAMINOPHEN 325 MG PO TABS: 650.0000 mg | ORAL_TABLET | Freq: Three times a day (TID) | ORAL | Status: DC | PRN

## 2019-08-02 MED ORDER — METOPROLOL TARTRATE 25 MG PO TABS
25.0000 mg | ORAL_TABLET | Freq: Two times a day (BID) | ORAL | Status: DC
  Administered 2019-08-02 – 2019-08-05 (×6): 25 mg via ORAL
  Filled 2019-08-02 (×6): qty 1

## 2019-08-02 MED ORDER — FLUTICASONE PROPIONATE 50 MCG/ACT NA SUSP
1.0000 | Freq: Every day | NASAL | Status: DC
  Administered 2019-08-02 – 2019-08-05 (×4): 1 via NASAL
  Filled 2019-08-02: qty 16

## 2019-08-02 MED ORDER — INSULIN ASPART 100 UNIT/ML ~~LOC~~ SOLN
0.0000 [IU] | Freq: Three times a day (TID) | SUBCUTANEOUS | Status: DC
  Administered 2019-08-02: 5 [IU] via SUBCUTANEOUS
  Administered 2019-08-03 – 2019-08-04 (×5): 8 [IU] via SUBCUTANEOUS
  Administered 2019-08-04: 3 [IU] via SUBCUTANEOUS
  Administered 2019-08-05: 8 [IU] via SUBCUTANEOUS
  Administered 2019-08-05: 5 [IU] via SUBCUTANEOUS
  Filled 2019-08-02: qty 0.15

## 2019-08-02 MED ORDER — INSULIN ASPART 100 UNIT/ML ~~LOC~~ SOLN
5.0000 [IU] | Freq: Once | SUBCUTANEOUS | Status: AC
  Administered 2019-08-02: 5 [IU] via SUBCUTANEOUS
  Filled 2019-08-02: qty 0.05

## 2019-08-02 MED ORDER — ONDANSETRON HCL 4 MG/2ML IJ SOLN
4.0000 mg | Freq: Four times a day (QID) | INTRAMUSCULAR | Status: DC | PRN
  Administered 2019-08-02 – 2019-08-03 (×2): 4 mg via INTRAVENOUS
  Filled 2019-08-02 (×3): qty 2

## 2019-08-02 MED ORDER — ONDANSETRON HCL 4 MG/2ML IJ SOLN
4.0000 mg | Freq: Once | INTRAMUSCULAR | Status: AC
  Administered 2019-08-02: 4 mg via INTRAVENOUS
  Filled 2019-08-02: qty 2

## 2019-08-02 MED ORDER — SODIUM CHLORIDE 0.9 % IV BOLUS
1000.0000 mL | Freq: Once | INTRAVENOUS | Status: AC
  Administered 2019-08-02: 1000 mL via INTRAVENOUS

## 2019-08-02 MED ORDER — METRONIDAZOLE IN NACL 5-0.79 MG/ML-% IV SOLN
500.0000 mg | Freq: Once | INTRAVENOUS | Status: AC
  Administered 2019-08-02: 500 mg via INTRAVENOUS
  Filled 2019-08-02: qty 100

## 2019-08-02 MED ORDER — ONDANSETRON HCL 4 MG PO TABS: 4.0000 mg | ORAL_TABLET | Freq: Four times a day (QID) | ORAL | Status: DC | PRN

## 2019-08-02 MED ORDER — HYDROCODONE-ACETAMINOPHEN 7.5-325 MG PO TABS
1.0000 | ORAL_TABLET | Freq: Four times a day (QID) | ORAL | Status: DC | PRN
  Administered 2019-08-02 – 2019-08-04 (×3): 1 via ORAL
  Filled 2019-08-02 (×3): qty 1

## 2019-08-02 MED ORDER — SODIUM CHLORIDE 0.9 % IV SOLN
2.0000 g | INTRAVENOUS | Status: DC
  Administered 2019-08-03: 2 g via INTRAVENOUS
  Filled 2019-08-02: qty 20
  Filled 2019-08-02: qty 2

## 2019-08-02 MED ORDER — INSULIN NPH (HUMAN) (ISOPHANE) 100 UNIT/ML ~~LOC~~ SUSP
15.0000 [IU] | Freq: Two times a day (BID) | SUBCUTANEOUS | Status: DC
  Administered 2019-08-02 – 2019-08-03 (×2): 15 [IU] via SUBCUTANEOUS
  Filled 2019-08-02: qty 10

## 2019-08-02 MED ORDER — HYDRALAZINE HCL 20 MG/ML IJ SOLN: 5.0000 mg | Freq: Four times a day (QID) | INTRAMUSCULAR | Status: DC | PRN

## 2019-08-02 MED ORDER — INSULIN ASPART 100 UNIT/ML ~~LOC~~ SOLN
10.0000 [IU] | Freq: Once | SUBCUTANEOUS | Status: AC
  Administered 2019-08-02: 10 [IU] via SUBCUTANEOUS
  Filled 2019-08-02: qty 0.1

## 2019-08-02 MED ORDER — SODIUM CHLORIDE 0.9 % IV SOLN
1.0000 g | Freq: Once | INTRAVENOUS | Status: AC
  Administered 2019-08-02: 1 g via INTRAVENOUS
  Filled 2019-08-02: qty 10

## 2019-08-02 NOTE — H&P (Signed)
History and Physical  [name] [id number] DOB: April 15, 1950 DOA: 08/02/2019   PCP: Leighton Ruff, MD  Patient coming from: Home At her baseline ambulates independently  Chief Complaint: Abdominal pain, diarrhea, increased nausea and vomiting  HPI: [name] is a 69 y.o. female with medical history significant for type 2 diabetes, HLD, recent diagnosis of diverticulitis on 07/06/2019 on ED evaluation and treatment as outpatient with Cipro and Flagyl, who presents on 08/02/2019 with recurrent abdominal pain, nausea, vomiting, decreased ability to tolerate p.o. intake, diarrhea.   Patient states ciprofloxacin GEN she is.  CT.  She was in her belly pain had improved and her PCP agreed upon upon follow-up.  However earlier this week on Monday patient noted worsening abdominal pain, increased nausea, vomiting, inability to tolerate p.o. intake, worsening diarrhea (loose stool every hour).  She was evaluated at her PCPs office on Thursday.  They initially recommended being sent back to the ED due to her fever of 102.3, and heart rate of 112, but she elected for another course of ciprofloxacin and Flagyl.  She is able to take the ciprofloxacin and Flagyl for few days y however due to persistent nausea vomiting and diarrhea and persistent abdominal pain and inability to tolerate p.o. intake she was not able to continue antibiotic course and presented to ED today.  She also endorsed increased weakness, loss of balance, and dyspnea with minimal exertion  Patient denied any fevers, chills, recent sick contacts.  No chest pain, no shortness of breath.  Of note patient states over the last 2 days she has not been taking her insulin as she typically does because of her decreased appetite and inability to tolerate oral she decided to hold off on insulin therapy given her blood sugars running low in the 1 teens.  Though she reports this morning she did take 20 units of Novolin was able to tolerate  some cereal without vomiting  ED course: T-max 97.5, hemodynamically stable.  C. difficile negative.  GI pathogen panel pending.  Troponin unremarkable.  BUN 31, glucose 220. Initially anion gap of 18 with urine showing glucosuria and 20 ketones patient received subcutaneous insulin 5 units.  Repeat BMP showed resolution of CO2, and anion gap of 8. Repeat CT scan showed pancolonic thickening/stranding consistent with acute diverticulitis. Patient was given 2 L bolus of normal saline, IV Pepcid, IV ceftriaxone and Flagyl  [facility] service was called for further management and evaluation of acute diverticulitis that failed outpatient treatment  Review of Systems:As mentioned in the history of present illness.Review of systems are otherwise negative Patient seen in the ED.   Past Medical History:  Diagnosis Date  . Diabetes mellitus   . Diverticulitis   . Fibromyalgia   . Hypercholesteremia   . Hypertension   . IBS (irritable bowel syndrome)   . Migraines   . Nasal fracture   . Stein-Leventhal ovaries    Past Surgical History:  Procedure Laterality Date  . CHOLECYSTECTOMY  1998  . KNEE SURGERY Right 2017  . LEFT HEART CATHETERIZATION WITH CORONARY ANGIOGRAM N/A 12/19/2011   Procedure: LEFT HEART CATHETERIZATION WITH CORONARY ANGIOGRAM;  Surgeon: Candee Furbish, MD;  Location: [facility] CATH LAB;  Service: Cardiovascular;  Laterality: N/A;  . SHOULDER SURGERY    . TONSILLECTOMY  1959   Allergies  Allergen Reactions  . Contrast Media [Iodinated Diagnostic Agents] Hives  . Erythromycin Anaphylaxis  . Statins Other (See Comments)    Severe muscle pain/weakness  . Augmentin [Amoxicillin-Pot Clavulanate]  Nausea And Vomiting    Nausea, vomiting, headache, dizziness   pt states she can take other Penicillins just not augmentin  . Basaglar [place] [Insulin Glargine]     Severe ankle swelling  . Metformin And Related Diarrhea and Nausea And Vomiting    Violently ill for 2 yrs  .  Nsaids Other (See Comments)    Stomach problems  . Prednisone Other (See Comments)    Abnormal blood sugars..  . Scallops [Shellfish Allergy] Diarrhea and Nausea And Vomiting    Severe GI upset  . Sulfa Antibiotics Other (See Comments)    blackout  . Invokana [Canagliflozin] Rash and Other (See Comments)    Dehydration  . Tramadol Rash   Social History:  reports that she has never smoked. She has never used smokeless tobacco. She reports current alcohol use. She reports that she does not use drugs. Family History  Adopted: Yes  Problem Relation Age of Onset  . Failure to thrive Mother   . Heart failure Father   . Neuropathy Neg Hx       Prior to Admission medications   Medication Sig Start Date End Date Taking? Authorizing Provider  acetaminophen (TYLENOL) 650 MG CR tablet Take 650 mg by mouth every 8 (eight) hours as needed for pain.   Yes [provider]  acetaminophen-codeine (TYLENOL #3) 300-30 MG per tablet Take 1 tablet by mouth 2 (two) times daily as needed for severe pain.    Yes [provider]  cholecalciferol (VITAMIN D) 1000 UNITS tablet Take 1,000 Units by mouth every evening.    Yes [provider]  ciprofloxacin (CIPRO) 500 MG tablet Take 500 mg by mouth every 12 (twelve) hours. 07/29/19  Yes [provider]  fluticasone (FLONASE) 50 MCG/ACT nasal spray Place 1 spray into both nostrils daily. 06/01/19  Yes [provider]  hydrochlorothiazide (HYDRODIURIL) 25 MG tablet Take 12.5 mg by mouth daily.   Yes [provider]  HYDROcodone-acetaminophen (NORCO) 7.5-325 MG tablet Take 1 tablet by mouth every 6 (six) hours as needed (Migraine headaches).    Yes [provider]  ibuprofen (ADVIL) 200 MG tablet Take 200 mg by mouth every 8 (eight) hours as needed for mild pain.   Yes [provider]  insulin NPH Human (NOVOLIN N) 100 UNIT/ML injection Inject 18 units in am and 25 units in pm Patient taking  differently: Inject 20 Units into the skin 2 (two) times daily before a meal.  10/16/17  Yes Philemon Kingdom, MD  insulin regular (NOVOLIN R RELION) 100 units/mL injection Inject 0.08-0.1 mLs (8-10 Units total) into the skin 2 (two) times daily before a meal. Patient taking differently: Inject 20 Units into the skin 2 (two) times daily before a meal.  05/12/17  Yes Philemon Kingdom, MD  metoprolol (LOPRESSOR) 50 MG tablet Take 25 mg by mouth 2 (two) times daily. 06/20/15  Yes [provider]  metroNIDAZOLE (FLAGYL) 500 MG tablet Take 500 mg by mouth 3 (three) times daily. 07/29/19  Yes [provider]  polyvinyl alcohol (LIQUIFILM TEARS) 1.4 % ophthalmic solution Place 1 drop into both eyes every 6 (six) hours as needed for dry eyes.   Yes [provider]  PRESCRIPTION MEDICATION Patient receives a Steroid injection in the neck once a year with Dr. Mina Marble at [facility] pt is unable to recall the exact name and i have been unable to verify name from the Dr's office. Patient also states " it shoots up my blood  sugars really high, but i still get it because i can't take anything else for my neck pain"   Yes [provider]  tiZANidine (ZANAFLEX) 4 MG tablet Take 4 mg by mouth at bedtime as needed for muscle spasms.    Yes [provider]  ACCU-CHEK SMARTVIEW test strip USE AS INSTRUCTED TO TEST TWICE DAILY 01/13/19   Philemon Kingdom, MD  Lancets (ACCU-CHEK MULTICLIX) lancets Use as instructed to test once daily DX E11.65 08/11/17   Philemon Kingdom, MD  gabapentin (NEURONTIN) 100 MG capsule Take 1 capsule (100 mg total) by mouth 3 (three) times daily. Patient not taking: Reported on 07/06/2019 07/07/17 07/06/19  Melvenia Beam, MD    Physical Exam: BP (!) 148/85 (BP Location: Left Arm)   Pulse 94   Temp 98.5 F (36.9 C)   Resp 15   Ht 5\' 5"  (1.651 m)   Wt 87.1 kg   SpO2 97%   BMI 31.95 kg/m   Constitutional normal appearing female Eyes: EOMI,  anicteric, normal conjunctivae ENMT: Oropharynx with moist mucous membranes, normal dentition Cardiovascular: RRR no MRGs, with no peripheral edema Respiratory: Normal respiratory effort on room air, clear breath sounds  Abdomen: Soft, tenderness to palpation mainly in lower quadrants, no rebound tenderness, no guarding, decreased bowel sounds Skin: No rash ulcers, or lesions. Without skin tenting  Neurologic: Grossly no focal neuro deficit. Psychiatric:Appropriate affect, and mood. Mental status AAOx3          Labs on Admission:  Basic Metabolic Panel: Recent Labs  Lab 08/02/19 1006 08/02/19 1355  NA 133* 136  K 3.9 3.5  CL 95* 101  CO2 20* 22  GLUCOSE 460* 320*  BUN 35* 31*  CREATININE 1.23* 0.85  CALCIUM 8.9 8.4*   Liver Function Tests: Recent Labs  Lab 08/02/19 1006  AST 14*  ALT 15  ALKPHOS 95  BILITOT 0.8  PROT 7.6  ALBUMIN 3.2*   Recent Labs  Lab 08/02/19 1006  LIPASE 21   No results for input(s): AMMONIA in the last 168 hours. CBC: Recent Labs  Lab 08/02/19 1006  WBC 22.9*  NEUTROABS 18.5*  HGB 14.5  HCT 42.6  MCV 89.3  PLT 341   Cardiac Enzymes: No results for input(s): CKTOTAL, CKMB, CKMBINDEX, TROPONINI in the last 168 hours.  BNP (last 3 results) No results for input(s): BNP in the last 8760 hours.  ProBNP (last 3 results) No results for input(s): PROBNP in the last 8760 hours.  CBG: Recent Labs  Lab 08/02/19 1140 08/02/19 1307 08/02/19 1347 08/02/19 1656  GLUCAP 365* 313* 288* 208*    Radiological Exams on Admission: CT Abdomen Pelvis Wo Contrast  Result Date: 08/02/2019 CLINICAL DATA:  Diarrhea with epigastric abdominal pain EXAM: CT ABDOMEN AND PELVIS WITHOUT CONTRAST TECHNIQUE: Multidetector CT imaging of the abdomen and pelvis was performed following the standard protocol without IV contrast. COMPARISON:  07/06/2019 FINDINGS: Lower chest: No consolidation.  No pleural effusion. Hepatobiliary: Post cholecystectomy. Liver  normal in size and contour. Pancreas: Normal contour, no inflammatory changes about the pancreas. Spleen: Spleen normal in size and contour. Adrenals/Urinary Tract: Adrenal glands are normal. Kidneys with smooth renal contours. No hydronephrosis. No nephrolithiasis. Stomach/Bowel: Stomach and small bowel are normal. No bowel obstruction. Appendix is normal. Pancolonic thickening and pericolonic stranding. Stranding about the sigmoid slightly more intense than other areas. Small free fluid in the pelvis. No free air.  Trace fluid in the pelvis.  No pneumatosis. Vascular/Lymphatic: Calcified atheromatous plaque of the abdominal  aorta. No adenopathy. No pelvic lymphadenopathy. Reproductive: No adnexal masses. Stranding about the LEFT adnexa more closely associated with the colon. Other: Trace free fluid in the pelvis as discussed. No free air. No abdominal wall hernia. Musculoskeletal: Spinal degenerative change. No acute bone finding or destructive bone process. IMPRESSION: 1. Pancolonic thickening and pericolonic stranding. Stranding about the sigmoid slightly more intense than other areas. Findings were compatible with acute diverticulitis on the previous exam now with superimposed pancolonic inflammation suggestive of infectious colitis such as C diff colitis in the current context. 2. Aortic atherosclerosis. Aortic Atherosclerosis (ICD10-I70.0). Electronically Signed   By: Zetta Bills M.D.   On: 08/02/2019 12:34   DG Chest 2 View  Result Date: 08/02/2019 CLINICAL DATA:  Palpitations and chest pain. EXAM: CHEST - 2 VIEW COMPARISON:  None. FINDINGS: The cardiac silhouette, mediastinal and hilar contours are within normal limits. Mild tortuosity and calcification of the thoracic aorta is noted. The lungs are clear. No pleural effusions. The bony thorax is intact. IMPRESSION: No acute cardiopulmonary findings. Electronically Signed   By: Marijo Sanes M.D.   On: 08/02/2019 10:44    EKG: Independently  reviewed. normal sinus rhythm.  Assessment/Plan Present on Admission: . Diverticulitis . Essential hypertension . Diarrhea . Pancolitis ([place])  Active Problems:   Type 2 diabetes mellitus with hyperglycemia, with long-term current use of insulin ([place])   Diverticulitis   Essential hypertension   Diarrhea   Pancolitis ([place])   Acute diverticulitis with superimposed pancolonic inflammation, worsening Failed outpatient therapy.  Likely due to inability to tolerate oral intake due to persistent nausea/vomiting.  CT scan shows pancolonic thickening suggestive of infectious colitis, has remained afebrile, though does have a leukocytosis of 22.9.  C. difficile negative -GI pathogen panel pending, enteric precautions -Blood cultures if becomes febrile -Continue IV ceftriaxone, Flagyl -Supportive care with IV fluids for 24 hours, reassess in a.m. -Patient would like to trial diet, monitor, if fails will likely elect for bowel rest  Type 2 diabetes, poorly controlled, A1c 9 Home regimen includes NPH 20 units twice daily -Decreased NPH to 15 units twice daily given patient would like to trial diet and blood glucose quite elevated on admission -CBG, monitor sliding scale  Hypertension, currently at goal Given increased diarrhea, diminished oral intake will hold off on home HCTZ -Continue home Lopressor 50 mg twice daily DVT prophylaxis: Heparin  Code Status: Full code  Family Communication: None Disposition Plan: Expect greater than 2 midnight stay given patient has failed outpatient treatment for acute diverticulitis and currently warrants IV antibiotics, close monitoring ability to tolerate oral intake, close monitoring blood sugars  Consults called: None  Admission status: Admitted as inpatient to [facility] unit .      Desiree Hane MD [facility]  Pager [phone]  If 7PM-7AM, please contact night-coverage [email or web address] Password [facility]  08/02/2019, 10:02 PM

## 2019-08-02 NOTE — ED Notes (Signed)
Pager on floor broken, charge RN is approving bed now.

## 2019-08-02 NOTE — Progress Notes (Signed)
MD made aware pt has no PRN orders for pain or nausea.

## 2019-08-02 NOTE — ED Triage Notes (Signed)
Patient here from home reporting epigastric abd pain with nausea.

## 2019-08-02 NOTE — ED Provider Notes (Addendum)
[facility] DEPT Provider Note   CSN: [id number] Arrival date & time: 08/02/19  0908    History Abd pain, Nausea  [name] is a 69 y.o. female with past medical history significant for diabetes, diverticulitis, hypertension, IBS, migraines, renal stones who presents for evaluation abdominal pain.  Seen here in ED proxy 1 month ago diagnosed with diverticulitis.  Started on antibiotics.  Patient with persistent pain.  Was followed by PCP and restarted on antibiotics.  Continue to have fevers up until yesterday.  Temp max 102.0 at home.  Has had persistent watery diarrhea with multiple episodes of daily.  Will occasionally have soft stool however majority is liquid consistency.  No melena or bright red blood per rectum.  States her lower abdominal cramping has improved however she now has generalized upper abdominal pain as well as acid reflux.  Was able to keep down small bowl of cereal peaches over the last 24 hours however prior to that she was unable to keep down even liquids.  Initially was prescribed Phenergan however was not able to get this refilled after initial prescription due to cost.  Patient states she feels like her symptoms have been returning.  Not followed by GI currently.  Denies additional aggravating or relieving factors.  Rates pain a 4/10.   Abx Cipro/ Flagyl. 07/06/19-07/16/19 Seen PCP 07/29/19 for fever and abd pain. Restarted on Abd. Pain Persistent Emesis 3x daily. Intermittent soft stool. No melena or BRBPR. Now epigastric pain and generalized. Not worse with meals. No relief with tums. Intermittent palpitations however no CP, SOB, diaphoresis. No current fever. Last fever Sunday.  HPI     Past Medical History:  Diagnosis Date  . Diabetes mellitus   . Diverticulitis   . Fibromyalgia   . Hypercholesteremia   . Hypertension   . IBS (irritable bowel syndrome)   . Migraines   . Nasal fracture   . Stein-Leventhal ovaries      Patient Active Problem List   Diagnosis Date Noted  . Hyperlipidemia 05/12/2017  . Trigeminal neuralgia of right side of face 01/06/2017  . Other nonspecific abnormal cardiovascular system function study 12/19/2011  . Type 2 diabetes mellitus with hyperglycemia, with long-term current use of insulin ([place]) 12/19/2011    Past Surgical History:  Procedure Laterality Date  . CHOLECYSTECTOMY  1998  . KNEE SURGERY Right 2017  . LEFT HEART CATHETERIZATION WITH CORONARY ANGIOGRAM N/A 12/19/2011   Procedure: LEFT HEART CATHETERIZATION WITH CORONARY ANGIOGRAM;  Surgeon: Candee Furbish, MD;  Location: [facility] CATH LAB;  Service: Cardiovascular;  Laterality: N/A;  . SHOULDER SURGERY    . TONSILLECTOMY  1959     OB History   No obstetric history on file.     Family History  Adopted: Yes  Problem Relation Age of Onset  . Failure to thrive Mother   . Heart failure Father   . Neuropathy Neg Hx     Social History   Tobacco Use  . Smoking status: Never Smoker  . Smokeless tobacco: Never Used  Vaping Use  . Vaping Use: Never used  Substance Use Topics  . Alcohol use: Yes    Comment: rare  . Drug use: No    Home Medications Prior to Admission medications   Medication Sig Start Date End Date Taking? Authorizing Provider  ACCU-CHEK SMARTVIEW test strip USE AS INSTRUCTED TO TEST TWICE DAILY 01/13/19   Philemon Kingdom, MD  acetaminophen (TYLENOL) 650 MG CR tablet Take 650 mg by mouth  every 8 (eight) hours as needed for pain.    [provider]  acetaminophen-codeine (TYLENOL #3) 300-30 MG per tablet Take 1 tablet by mouth every 4 (four) hours as needed for moderate pain or severe pain. pain     [provider]  cholecalciferol (VITAMIN D) 1000 UNITS tablet Take 1,000 Units by mouth every evening.     [provider]  fluticasone (FLONASE) 50 MCG/ACT nasal spray Place 1 spray into both nostrils daily. 06/01/19   [provider]  hydrochlorothiazide  (HYDRODIURIL) 25 MG tablet Take 12.5 mg by mouth daily.    [provider]  HYDROcodone-acetaminophen (NORCO) 7.5-325 MG tablet Take 1 tablet by mouth every 6 (six) hours as needed for moderate pain.    [provider]  ibuprofen (ADVIL) 200 MG tablet Take 200 mg by mouth every 6 (six) hours as needed for moderate pain.    [provider]  insulin NPH Human (NOVOLIN N) 100 UNIT/ML injection Inject 18 units in am and 25 units in pm Patient taking differently: Inject 20 Units into the skin 2 (two) times daily before a meal.  10/16/17   Philemon Kingdom, MD  insulin regular (NOVOLIN R RELION) 100 units/mL injection Inject 0.08-0.1 mLs (8-10 Units total) into the skin 2 (two) times daily before a meal. Patient taking differently: Inject 20 Units into the skin 2 (two) times daily before a meal.  05/12/17   Philemon Kingdom, MD  Lancets (ACCU-CHEK MULTICLIX) lancets Use as instructed to test once daily DX E11.65 08/11/17   Philemon Kingdom, MD  metoprolol (LOPRESSOR) 50 MG tablet Take 25 mg by mouth 2 (two) times daily. 06/20/15   [provider]  PRESCRIPTION MEDICATION Patient receives a Steroid injection in the neck once a year with Dr. Mina Marble at [facility] pt is unable to recall the exact name and i have been unable to verify name from the Dr's office. Patient also states " it shoots up my blood sugars really high, but i still get it because i can't take anything else for my neck pain"    [provider]  promethazine (PHENERGAN) 25 MG tablet Take 1 tablet (25 mg total) by mouth every 6 (six) hours as needed for nausea. 07/06/19   Truddie Hidden, MD  tiZANidine (ZANAFLEX) 4 MG tablet Take 4 mg by mouth every 6 (six) hours as needed for muscle spasms.    [provider]  gabapentin (NEURONTIN) 100 MG capsule Take 1 capsule (100 mg total) by mouth 3 (three) times daily. Patient not taking: Reported on 07/06/2019 07/07/17 07/06/19  Melvenia Beam, MD      Allergies    Contrast media [iodinated diagnostic agents], Augmentin [amoxicillin-pot clavulanate], Basaglar kwikpen [insulin glargine], Erythromycin, Metformin and related, Nsaids, Prednisone, Statins, Sulfa antibiotics, Invokana [canagliflozin], and Tramadol  Review of Systems   Review of Systems  Constitutional: Negative.   HENT: Negative.   Respiratory: Negative.   Cardiovascular: Positive for palpitations (Occasional fluttering in chest).  Gastrointestinal: Positive for abdominal pain, diarrhea, nausea and vomiting. Negative for abdominal distention, anal bleeding, blood in stool, constipation and rectal pain.  Genitourinary: Negative.   Musculoskeletal: Negative.   Skin: Negative.   Neurological: Negative.   All other systems reviewed and are negative.   Physical Exam Updated Vital Signs BP 121/60 (BP Location: Left Arm)   Pulse 79   Temp 98.9 F (37.2 C) (Oral)   Resp 17   SpO2 100%   Physical Exam Vitals and nursing  note reviewed.  Constitutional:      General: She is not in acute distress.    Appearance: She is well-developed. She is not ill-appearing, toxic-appearing or diaphoretic.  HENT:     Head: Normocephalic and atraumatic.     Jaw: There is normal jaw occlusion.     Mouth/Throat:     Mouth: Mucous membranes are dry.     Pharynx: Oropharynx is clear.  Eyes:     Pupils: Pupils are equal, round, and reactive to light.  Neck:     Trachea: Trachea and phonation normal.  Cardiovascular:     Rate and Rhythm: Normal rate.     Pulses: Normal pulses.          Radial pulses are 2+ on the right side and 2+ on the left side.       Dorsalis pedis pulses are 2+ on the right side and 2+ on the left side.  Pulmonary:     Effort: Pulmonary effort is normal. No respiratory distress.     Breath sounds: Normal breath sounds and air entry.  Abdominal:     General: Bowel sounds are normal. There is no distension.     Palpations: Abdomen is soft.     Tenderness:  There is generalized abdominal tenderness. There is no right CVA tenderness, left CVA tenderness, guarding or rebound. Negative signs include Murphy's sign and McBurney's sign.  Musculoskeletal:        General: Normal range of motion.     Cervical back: Full passive range of motion without pain and normal range of motion.     Comments: Moves all 4 extremities without difficulty. Compartments soft.  Skin:    General: Skin is warm and dry.     Capillary Refill: Capillary refill takes 2 to 3 seconds.     Comments: Tactile temp to extremities  Neurological:     Mental Status: She is alert.     ED Results / Procedures / Treatments   Labs (all labs ordered are listed, but only abnormal results are displayed) Labs Reviewed  CBC WITH DIFFERENTIAL/PLATELET - Abnormal; Notable for the following components:      Result Value   WBC 22.9 (*)    Neutro Abs 18.5 (*)    Monocytes Absolute 1.8 (*)    Basophils Absolute 0.2 (*)    Abs Immature Granulocytes 0.57 (*)    All other components within normal limits  COMPREHENSIVE METABOLIC PANEL - Abnormal; Notable for the following components:   Sodium 133 (*)    Chloride 95 (*)    CO2 20 (*)    Glucose, Bld 460 (*)    BUN 35 (*)    Creatinine, Ser 1.23 (*)    Albumin 3.2 (*)    AST 14 (*)    GFR calc non Af Amer 44 (*)    GFR calc Af Amer 51 (*)    Anion gap 18 (*)    All other components within normal limits  URINALYSIS, ROUTINE W REFLEX MICROSCOPIC - Abnormal; Notable for the following components:   Glucose, UA >=500 (*)    Hgb urine dipstick SMALL (*)    Ketones, ur 80 (*)    Leukocytes,Ua TRACE (*)    Bacteria, UA RARE (*)    All other components within normal limits  BASIC METABOLIC PANEL - Abnormal; Notable for the following components:   Glucose, Bld 320 (*)    BUN 31 (*)    Calcium 8.4 (*)    All  other components within normal limits  CBG MONITORING, ED - Abnormal; Notable for the following components:   Glucose-Capillary 365  (*)    All other components within normal limits  CBG MONITORING, ED - Abnormal; Notable for the following components:   Glucose-Capillary 313 (*)    All other components within normal limits  CBG MONITORING, ED - Abnormal; Notable for the following components:   Glucose-Capillary 288 (*)    All other components within normal limits  C DIFFICILE QUICK SCREEN W PCR REFLEX  GASTROINTESTINAL PANEL BY PCR, STOOL (REPLACES STOOL CULTURE)  SARS CORONAVIRUS 2 BY RT PCR (HOSPITAL ORDER, [place] [facility])  LIPASE, BLOOD  TROPONIN I (HIGH SENSITIVITY)  TROPONIN I (HIGH SENSITIVITY)    EKG EKG Interpretation  Date/Time:  Monday August 02 2019 10:16:56 EDT Ventricular Rate:  85 PR Interval:    QRS Duration: 95 QT Interval:  379 QTC Calculation: 451 R Axis:   48 Text Interpretation: Sinus rhythm Low voltage, precordial leads Confirmed by Aletta Edouard [phone]) on 08/02/2019 10:23:55 AM   Radiology CT Abdomen Pelvis Wo Contrast  Result Date: 08/02/2019 CLINICAL DATA:  Diarrhea with epigastric abdominal pain EXAM: CT ABDOMEN AND PELVIS WITHOUT CONTRAST TECHNIQUE: Multidetector CT imaging of the abdomen and pelvis was performed following the standard protocol without IV contrast. COMPARISON:  07/06/2019 FINDINGS: Lower chest: No consolidation.  No pleural effusion. Hepatobiliary: Post cholecystectomy. Liver normal in size and contour. Pancreas: Normal contour, no inflammatory changes about the pancreas. Spleen: Spleen normal in size and contour. Adrenals/Urinary Tract: Adrenal glands are normal. Kidneys with smooth renal contours. No hydronephrosis. No nephrolithiasis. Stomach/Bowel: Stomach and small bowel are normal. No bowel obstruction. Appendix is normal. Pancolonic thickening and pericolonic stranding. Stranding about the sigmoid slightly more intense than other areas. Small free fluid in the pelvis. No free air.  Trace fluid in the pelvis.  No pneumatosis.  Vascular/Lymphatic: Calcified atheromatous plaque of the abdominal aorta. No adenopathy. No pelvic lymphadenopathy. Reproductive: No adnexal masses. Stranding about the LEFT adnexa more closely associated with the colon. Other: Trace free fluid in the pelvis as discussed. No free air. No abdominal wall hernia. Musculoskeletal: Spinal degenerative change. No acute bone finding or destructive bone process. IMPRESSION: 1. Pancolonic thickening and pericolonic stranding. Stranding about the sigmoid slightly more intense than other areas. Findings were compatible with acute diverticulitis on the previous exam now with superimposed pancolonic inflammation suggestive of infectious colitis such as C diff colitis in the current context. 2. Aortic atherosclerosis. Aortic Atherosclerosis (ICD10-I70.0). Electronically Signed   By: Zetta Bills M.D.   On: 08/02/2019 12:34   DG Chest 2 View  Result Date: 08/02/2019 CLINICAL DATA:  Palpitations and chest pain. EXAM: CHEST - 2 VIEW COMPARISON:  None. FINDINGS: The cardiac silhouette, mediastinal and hilar contours are within normal limits. Mild tortuosity and calcification of the thoracic aorta is noted. The lungs are clear. No pleural effusions. The bony thorax is intact. IMPRESSION: No acute cardiopulmonary findings. Electronically Signed   By: Marijo Sanes M.D.   On: 08/02/2019 10:44    Procedures Procedures (including critical care time)  Medications Ordered in ED Medications  cefTRIAXone (ROCEPHIN) 1 g in sodium chloride 0.9 % 100 mL IVPB (1 g Intravenous New Bag/Given 08/02/19 1409)  metroNIDAZOLE (FLAGYL) IVPB 500 mg (has no administration in time range)  insulin aspart (novoLOG) injection 5 Units (has no administration in time range)  sodium chloride 0.9 % bolus 1,000 mL (0 mLs Intravenous Stopped 08/02/19 1141)  fentaNYL (SUBLIMAZE) injection 50 mcg (50 mcg Intravenous Given 08/02/19 1047)  ondansetron (ZOFRAN) injection 4 mg (4 mg Intravenous Given  08/02/19 1046)  famotidine (PEPCID) IVPB 20 mg premix (0 mg Intravenous Stopped 08/02/19 1125)  insulin aspart (novoLOG) injection 10 Units (10 Units Subcutaneous Given 08/02/19 1142)  sodium chloride 0.9 % bolus 1,000 mL (1,000 mLs Intravenous New Bag/Given 08/02/19 1410)    ED Course  I have reviewed the triage vital signs and the nursing notes.  Pertinent labs & imaging results that were available during my care of the patient were reviewed by me and considered in my medical decision making (see chart for details).  69 year old female presents for evaluation of recurrent abdominal pain, diarrhea and emesis.  She is afebrile, nonseptic, not ill-appearing.  Has been on 2 rounds of antibiotics for diverticulitis, one obtained from prior visit in the ED as well as 1 from PCP office.  Persistent NBNB emesis.  Abdomen diffusely tender.  She does look clinically dehydrated.  Multiple episodes of watery diarrhea without melena or bright red per rectum.  Plan on labs, imaging and reassess  Labs and imaging personally reviewed and interpreted: CBC with leukocytosis at 22.9, up from prior Metabolic panel with creatinine 1.23, up from previous, anion gap 18 UA without infection however with ketonuria. Lipase 21 Trop 7  Reassessed.  Pain controlled.  Discussed failed outpatient therapy.  She is agreeable to inpatient.  Patient does have hyperglycemia, anion gap and ketonuria.  Meets criteria for DKA however question of anion gap due to dehydration.  CBG trending down to 288 to 10 units of insulin and IV fluids.  Will give additional fluids.  Will hold on insulin drip, plan to recheck metabolic panel to see if gap is closed.  If is not closed would likely need to start insulin drip.  CONSULT with Larkin Ina, pharmacy.  He recommends Rocephin, Flagyl IV and p.o. Vanco once stool sample was obtained.  CONSULT with  Dr. Lonny Prude with [facility] who will evaluate patient for admission.  Recheck metabolic panel with  closed gap.  Patient getting additional insulin for hyperglycemia and fluids.  The patient appears reasonably stabilized for admission considering the current resources, flow, and capabilities available in the ED at this time, and I doubt any other [facility] requiring further screening and/or treatment in the ED prior to admission.  Clinical Course as of Aug 01 1429  Mon Aug 02, 3979  2262 69 year old female here with continued diarrhea has been going on for a month watery in nature.  Has been on courses of antibiotics for diverticulitis.  Afebrile here nontoxic-appearing.  Elevated white count.  Getting labs fluids nausea medication and a repeat CT.   [MB]    Clinical Course User Index [MB] Hayden Rasmussen, MD   MDM Rules/Calculators/A&P                           Final Clinical Impression(s) / ED Diagnoses Final diagnoses:  Diverticulitis  C. difficile colitis  Diabetic ketoacidosis without coma associated with type 2 diabetes mellitus [facility])    Rx / DC Orders ED Discharge Orders    None       Belissa Kooy A, PA-C 08/02/19 1411    Miliana Gangwer A, PA-C 08/02/19 1431    Hayden Rasmussen, MD 08/02/19 1725

## 2019-08-02 NOTE — ED Notes (Signed)
To CT

## 2019-08-03 LAB — GASTROINTESTINAL PANEL BY PCR, STOOL (REPLACES STOOL CULTURE)

## 2019-08-03 LAB — COMPREHENSIVE METABOLIC PANEL
ALT: 13 U/L (ref 0–44)
AST: 15 U/L (ref 15–41)
Albumin: 2.7 g/dL — ABNORMAL LOW (ref 3.5–5.0)
Alkaline Phosphatase: 76 U/L (ref 38–126)
Anion gap: 14 (ref 5–15)
BUN: 28 mg/dL — ABNORMAL HIGH (ref 8–23)
CO2: 20 mmol/L — ABNORMAL LOW (ref 22–32)
Calcium: 8.2 mg/dL — ABNORMAL LOW (ref 8.9–10.3)
Chloride: 103 mmol/L (ref 98–111)
Creatinine, Ser: 0.87 mg/dL (ref 0.44–1.00)
GFR calc Af Amer: >60 mL/min (ref >60)
GFR calc non Af Amer: >60 mL/min (ref >60)
Glucose, Bld: 291 mg/dL — ABNORMAL HIGH (ref 70–99)
Potassium: 3.3 mmol/L — ABNORMAL LOW (ref 3.5–5.1)
Sodium: 137 mmol/L (ref 135–145)
Total Bilirubin: 0.9 mg/dL (ref 0.3–1.2)
Total Protein: 6.1 g/dL — ABNORMAL LOW (ref 6.5–8.1)

## 2019-08-03 LAB — CBC
HCT: 39.4 % (ref 36.0–46.0)
Hemoglobin: 13.1 g/dL (ref 12.0–15.0)
MCH: 30.1 pg (ref 26.0–34.0)
MCHC: 33.2 g/dL (ref 30.0–36.0)
MCV: 90.6 fL (ref 80.0–100.0)
Platelets: 289 10*3/uL (ref 150–400)
RBC: 4.35 MIL/uL (ref 3.87–5.11)
RDW: 13.1 % (ref 11.5–15.5)
WBC: 16.9 10*3/uL — ABNORMAL HIGH (ref 4.0–10.5)
nRBC: 0 % (ref 0.0–0.2)

## 2019-08-03 LAB — GLUCOSE, CAPILLARY
Glucose-Capillary: 260 mg/dL — ABNORMAL HIGH (ref 70–99)
Glucose-Capillary: 269 mg/dL — ABNORMAL HIGH (ref 70–99)
Glucose-Capillary: 256 mg/dL — ABNORMAL HIGH (ref 70–99)
Glucose-Capillary: 214 mg/dL — ABNORMAL HIGH (ref 70–99)

## 2019-08-03 LAB — HIV ANTIBODY (ROUTINE TESTING W REFLEX): HIV Screen 4th Generation wRfx: NONREACTIVE

## 2019-08-03 MED ORDER — INSULIN ASPART 100 UNIT/ML ~~LOC~~ SOLN
4.0000 [IU] | Freq: Three times a day (TID) | SUBCUTANEOUS | Status: DC
  Administered 2019-08-03 – 2019-08-05 (×6): 4 [IU] via SUBCUTANEOUS

## 2019-08-03 MED ORDER — POTASSIUM CHLORIDE 10 MEQ/100ML IV SOLN
10.0000 meq | INTRAVENOUS | Status: AC
  Administered 2019-08-03 (×2): 10 meq via INTRAVENOUS
  Filled 2019-08-03 (×2): qty 100

## 2019-08-03 MED ORDER — PROMETHAZINE HCL 25 MG/ML IJ SOLN
6.2500 mg | Freq: Four times a day (QID) | INTRAMUSCULAR | Status: DC | PRN
  Administered 2019-08-03 (×2): 6.25 mg via INTRAVENOUS
  Filled 2019-08-03 (×2): qty 1

## 2019-08-03 MED ORDER — INSULIN NPH (HUMAN) (ISOPHANE) 100 UNIT/ML ~~LOC~~ SUSP
20.0000 [IU] | Freq: Two times a day (BID) | SUBCUTANEOUS | Status: DC
  Administered 2019-08-03 – 2019-08-05 (×4): 20 [IU] via SUBCUTANEOUS
  Filled 2019-08-03: qty 10

## 2019-08-03 NOTE — Progress Notes (Signed)
PROGRESS NOTE    [name]    Code Status: Full Code  MPN:[id number] DOB: 04/22/1950 DOA: 08/02/2019 LOS: 1 days  PCP: Leighton Ruff, MD CC:  Chief Complaint  Patient presents with  . Abdominal Pain  . Nausea       Hospital Summary   This is a 69 year old female with a history of type 2 diabetes, hyperlipidemia, diverticulitis recently diagnosed on 6/1 upon ED evaluation who was treated with Cipro and Flagyl outpatient initially with improvement who presented on 6/28 for recurrent abdominal pain, nausea, vomiting and decreased p.o. intake with diarrhea.  She was seen at her PCPs office prior to her ED visit and was febrile and tachycardic at the time and initially recommended to go to the ED but elected for an additional course of Cipro/Flagyl however due to persistent/progressive symptoms she presented to the ED  ED course: T-max 97.5, hemodynamically stable.  C. difficile negative.  GI pathogen panel pending.  Troponin unremarkable.  BUN 31, glucose 220. Initially anion gap of 18 with urine showing glucosuria and 20 ketones patient received subcutaneous insulin 5 units.  Repeat BMP showed resolution of CO2, and anion gap of 8. Repeat CT scan showed pancolonic thickening/stranding consistent with acute diverticulitis. Patient was given 2 L bolus of normal saline, IV Pepcid, IV ceftriaxone and Flagyl   A & P   Active Problems:   Type 2 diabetes mellitus with hyperglycemia, with long-term current use of insulin (HCC)   Diverticulitis   Essential hypertension   Diarrhea   Pancolitis ([place])   1. Acute diverticulitis with superimposed pancolonic inflammation, failed outpatient therapy a. Leukocytosis has improved b. Clinically improving and tolerating diet c. C. difficile and GI pathogen panel negative d. Continue ceftriaxone and Flagyl e. Advance diet as tolerated  2. Poorly controlled type 2 diabetes a. HG A1c 9.0 b. Home regimen is NPH 20 units twice  daily c. Increase insulin back to 20 units twice daily now that she is eating and with hyperglycemia, continue sliding scale d. Diabetic coordinator consulted  3. Hypertension a. Continue home Lopressor b. HCTZ on hold c. Continue as needed hydralazine   DVT prophylaxis: heparin injection 5,000 Units Start: 08/02/19 2200   Family Communication: No family at bedside  Disposition Plan: Can likely discharge in next 24 to 48 hours pending clinical stability Status is: Inpatient  Remains inpatient appropriate because:IV treatments appropriate due to intensity of illness or inability to take PO   Dispo: The patient is from: Home              Anticipated d/c is to: Home              Anticipated d/c date is: 1 day              Patient currently is not medically stable to d/c.          Pressure injury documentation    None  Consultants  None  Procedures  None  Antibiotics   Anti-infectives (From admission, onward)   Start     Dose/Rate Route Frequency Ordered Stop   08/03/19 1000  cefTRIAXone (ROCEPHIN) 2 g in sodium chloride 0.9 % 100 mL IVPB     Discontinue     2 g 200 mL/hr over 30 Minutes Intravenous Every 24 hours 08/02/19 1529     08/02/19 2200  metroNIDAZOLE (FLAGYL) IVPB 500 mg     Discontinue     500 mg 100 mL/hr over 60 Minutes Intravenous Every  8 hours 08/02/19 1529     08/02/19 1345  cefTRIAXone (ROCEPHIN) 1 g in sodium chloride 0.9 % 100 mL IVPB        1 g 200 mL/hr over 30 Minutes Intravenous  Once 08/02/19 1337 08/02/19 1504   08/02/19 1345  metroNIDAZOLE (FLAGYL) IVPB 500 mg        500 mg 100 mL/hr over 60 Minutes Intravenous  Once 08/02/19 1337 08/02/19 1610        Subjective   Patient seen and examined at bedside in no acute distress and resting comfortably. No acute events overnight.  Feels better today than when she presented.  tolerating diet well.  Objective   Vitals:   08/02/19 2206 08/03/19 0156 08/03/19 0529 08/03/19 1340  BP:  (!) 177/70 136/62 (!) 179/71 137/66  Pulse: 91 79 77 75  Resp: 16 16 15 19   Temp: 98.4 F (36.9 C) 98.8 F (37.1 C) 98.6 F (37 C) 98 F (36.7 C)  TempSrc: Oral Oral Oral Oral  SpO2: 97% 95% 97% 98%  Weight:      Height:        Intake/Output Summary (Last 24 hours) at 08/03/2019 1537 Last data filed at 08/03/2019 1430 Gross per 24 hour  Intake 3121.34 ml  Output 350 ml  Net 2771.34 ml   Filed Weights   08/02/19 1727  Weight: 87.1 kg    Examination:  Physical Exam Vitals and nursing note reviewed.  Constitutional:      Appearance: Normal appearance.  HENT:     Head: Normocephalic and atraumatic.  Eyes:     Conjunctiva/sclera: Conjunctivae normal.  Cardiovascular:     Rate and Rhythm: Normal rate and regular rhythm.  Pulmonary:     Effort: Pulmonary effort is normal.     Breath sounds: Normal breath sounds.  Abdominal:     General: Abdomen is flat.     Palpations: Abdomen is soft.     Tenderness: There is abdominal tenderness.  Musculoskeletal:        General: No swelling or tenderness.  Skin:    Coloration: Skin is not jaundiced or pale.  Neurological:     Mental Status: She is alert. Mental status is at baseline.  Psychiatric:        Mood and Affect: Mood normal.        Behavior: Behavior normal.     Data Reviewed: I have personally reviewed following labs and imaging studies  CBC: Recent Labs  Lab 08/02/19 1006 08/03/19 0501  WBC 22.9* 16.9*  NEUTROABS 18.5*  --   HGB 14.5 13.1  HCT 42.6 39.4  MCV 89.3 90.6  PLT 341 144   Basic Metabolic Panel: Recent Labs  Lab 08/02/19 1006 08/02/19 1355 08/03/19 0501  NA 133* 136 137  K 3.9 3.5 3.3*  CL 95* 101 103  CO2 20* 22 20*  GLUCOSE 460* 320* 291*  BUN 35* 31* 28*  CREATININE 1.23* 0.85 0.87  CALCIUM 8.9 8.4* 8.2*   GFR: Estimated Creatinine Clearance: 65.5 mL/min (by C-G formula based on SCr of 0.87 mg/dL). Liver Function Tests: Recent Labs  Lab 08/02/19 1006 08/03/19 0501  AST 14*  15  ALT 15 13  ALKPHOS 95 76  BILITOT 0.8 0.9  PROT 7.6 6.1*  ALBUMIN 3.2* 2.7*   Recent Labs  Lab 08/02/19 1006  LIPASE 21   No results for input(s): AMMONIA in the last 168 hours. Coagulation Profile: No results for input(s): INR, PROTIME in the last 168 hours.  Cardiac Enzymes: No results for input(s): CKTOTAL, CKMB, CKMBINDEX, TROPONINI in the last 168 hours. BNP (last 3 results) No results for input(s): PROBNP in the last 8760 hours. HbA1C: Recent Labs    08/02/19 1006  HGBA1C 9.0*   CBG: Recent Labs  Lab 08/02/19 1347 08/02/19 1656 08/02/19 2204 08/03/19 0726 08/03/19 1126  GLUCAP 288* 208* 245* 260* 269*   Lipid Profile: No results for input(s): CHOL, HDL, LDLCALC, TRIG, CHOLHDL, LDLDIRECT in the last 72 hours. Thyroid Function Tests: No results for input(s): TSH, T4TOTAL, FREET4, T3FREE, THYROIDAB in the last 72 hours. Anemia Panel: No results for input(s): VITAMINB12, FOLATE, FERRITIN, TIBC, IRON, RETICCTPCT in the last 72 hours. Sepsis Labs: No results for input(s): PROCALCITON, LATICACIDVEN in the last 168 hours.  Recent Results (from the past 240 hour(s))  SARS Coronavirus 2 by RT PCR (hospital order, performed in [facility] hospital lab) Nasopharyngeal Nasopharyngeal Swab     Status: None   Collection Time: 08/02/19  1:55 PM   Specimen: Nasopharyngeal Swab  Result Value Ref Range Status   SARS Coronavirus 2 NEGATIVE NEGATIVE Final    Comment: (NOTE) SARS-CoV-2 target nucleic acids are NOT DETECTED.  The SARS-CoV-2 RNA is generally detectable in upper and lower respiratory specimens during the acute phase of infection. The lowest concentration of SARS-CoV-2 viral copies this assay can detect is 250 copies / mL. A negative result does not preclude SARS-CoV-2 infection and should not be used as the sole basis for treatment or other patient management decisions.  A negative result may occur with improper specimen collection / handling, submission of  specimen other than nasopharyngeal swab, presence of viral mutation(s) within the areas targeted by this assay, and inadequate number of viral copies (<250 copies / mL). A negative result must be combined with clinical observations, patient history, and epidemiological information.  Fact Sheet for Patients:   StrictlyIdeas.no  Fact Sheet for Healthcare Providers: BankingDealers.co.za  This test is not yet approved or  cleared by the [place] FDA and has been authorized for detection and/or diagnosis of SARS-CoV-2 by FDA under an Emergency Use Authorization (EUA).  This EUA will remain in effect (meaning this test can be used) for the duration of the COVID-19 declaration under Section 564(b)(1) of the Act, 21 U.S.C. section 360bbb-3(b)(1), unless the authorization is terminated or revoked sooner.  Performed at [facility], [place] [address] [place], [place] [address]   C Difficile Quick Screen w PCR reflex     Status: None   Collection Time: 08/02/19  4:24 PM   Specimen: STOOL  Result Value Ref Range Status   C Diff antigen NEGATIVE NEGATIVE Final   C Diff toxin NEGATIVE NEGATIVE Final   C Diff interpretation No C. difficile detected.  Final    Comment: Performed at [facility], [place] [address], [place], [place] [address]  Gastrointestinal Panel by PCR , Stool     Status: None   Collection Time: 08/02/19  4:24 PM   Specimen: STOOL  Result Value Ref Range Status   Campylobacter species NOT DETECTED NOT DETECTED Final   Plesimonas shigelloides NOT DETECTED NOT DETECTED Final   Salmonella species NOT DETECTED NOT DETECTED Final   Yersinia enterocolitica NOT DETECTED NOT DETECTED Final   Vibrio species NOT DETECTED NOT DETECTED Final   Vibrio cholerae NOT DETECTED NOT DETECTED Final   Enteroaggregative E coli (EAEC) NOT DETECTED NOT DETECTED Final   Enteropathogenic E coli (EPEC) NOT DETECTED  NOT DETECTED Final  Enterotoxigenic E coli (ETEC) NOT DETECTED NOT DETECTED Final   Shiga like toxin producing E coli (STEC) NOT DETECTED NOT DETECTED Final   Shigella/Enteroinvasive E coli (EIEC) NOT DETECTED NOT DETECTED Final   Cryptosporidium NOT DETECTED NOT DETECTED Final   Cyclospora cayetanensis NOT DETECTED NOT DETECTED Final   Entamoeba histolytica NOT DETECTED NOT DETECTED Final   Giardia lamblia NOT DETECTED NOT DETECTED Final   Adenovirus F40/41 NOT DETECTED NOT DETECTED Final   Astrovirus NOT DETECTED NOT DETECTED Final   Norovirus GI/GII NOT DETECTED NOT DETECTED Final   Rotavirus A NOT DETECTED NOT DETECTED Final   Sapovirus (I, II, IV, and V) NOT DETECTED NOT DETECTED Final    Comment: Performed at [facility], [address], [place], [place] [address]         Radiology Studies: CT Abdomen Pelvis Wo Contrast  Result Date: 08/02/2019 CLINICAL DATA:  Diarrhea with epigastric abdominal pain EXAM: CT ABDOMEN AND PELVIS WITHOUT CONTRAST TECHNIQUE: Multidetector CT imaging of the abdomen and pelvis was performed following the standard protocol without IV contrast. COMPARISON:  07/06/2019 FINDINGS: Lower chest: No consolidation.  No pleural effusion. Hepatobiliary: Post cholecystectomy. Liver normal in size and contour. Pancreas: Normal contour, no inflammatory changes about the pancreas. Spleen: Spleen normal in size and contour. Adrenals/Urinary Tract: Adrenal glands are normal. Kidneys with smooth renal contours. No hydronephrosis. No nephrolithiasis. Stomach/Bowel: Stomach and small bowel are normal. No bowel obstruction. Appendix is normal. Pancolonic thickening and pericolonic stranding. Stranding about the sigmoid slightly more intense than other areas. Small free fluid in the pelvis. No free air.  Trace fluid in the pelvis.  No pneumatosis. Vascular/Lymphatic: Calcified atheromatous plaque of the abdominal aorta. No adenopathy. No pelvic lymphadenopathy.  Reproductive: No adnexal masses. Stranding about the LEFT adnexa more closely associated with the colon. Other: Trace free fluid in the pelvis as discussed. No free air. No abdominal wall hernia. Musculoskeletal: Spinal degenerative change. No acute bone finding or destructive bone process. IMPRESSION: 1. Pancolonic thickening and pericolonic stranding. Stranding about the sigmoid slightly more intense than other areas. Findings were compatible with acute diverticulitis on the previous exam now with superimposed pancolonic inflammation suggestive of infectious colitis such as C diff colitis in the current context. 2. Aortic atherosclerosis. Aortic Atherosclerosis (ICD10-I70.0). Electronically Signed   By: Zetta Bills M.D.   On: 08/02/2019 12:34   DG Chest 2 View  Result Date: 08/02/2019 CLINICAL DATA:  Palpitations and chest pain. EXAM: CHEST - 2 VIEW COMPARISON:  None. FINDINGS: The cardiac silhouette, mediastinal and hilar contours are within normal limits. Mild tortuosity and calcification of the thoracic aorta is noted. The lungs are clear. No pleural effusions. The bony thorax is intact. IMPRESSION: No acute cardiopulmonary findings. Electronically Signed   By: Marijo Sanes M.D.   On: 08/02/2019 10:44        Scheduled Meds: . fluticasone  1 spray Each Nare Daily  . heparin  5,000 Units Subcutaneous Q8H  . insulin aspart  0-15 Units Subcutaneous TID WC  . insulin NPH Human  15 Units Subcutaneous BID AC & HS  . metoprolol tartrate  25 mg Oral BID   Continuous Infusions: . sodium chloride 100 mL/hr at 08/02/19 2301  . cefTRIAXone (ROCEPHIN)  IV 2 g (08/03/19 1251)  . metronidazole 500 mg (08/03/19 0610)     Time spent: 25  minutes with over 50% of the time coordinating the patient's care    Harold Hedge, [place] Pager [phone]  Call night coverage person covering after 7pm

## 2019-08-03 NOTE — Progress Notes (Signed)
Inpatient Diabetes Program Recommendations  AACE/ADA: New Consensus Statement on Inpatient Glycemic Control (2015)  Target Ranges:  Prepandial:   less than 140 mg/dL      Peak postprandial:   less than 180 mg/dL (1-2 hours)      Critically ill patients:  140 - 180 mg/dL   Lab Results  Component Value Date   GLUCAP 256 (H) 08/03/2019   HGBA1C 9.0 (H) 08/02/2019    Review of Glycemic Control  Diabetes history: DM2  Outpatient Diabetes medications: [id number] bid Current orders for Inpatient glycemic control: Novolin 20 bid, Novolog 0-15 units tidwc + 4 units tidwc  HgbA1C - 9.0% Endo - Dr Cruzita Lederer  Inpatient Diabetes Program Recommendations:     Add Novolog 4 units tidwc for meal coverage insulin  Spoke with pt about her diabetes control and HgbA1C of 9%. Sees Dr Cruzita Lederer and states "we're working on getting my HgbA1C down." Pt monitors blood sugars several times/day. Instructed to f/u with Dr. Cruzita Lederer after discharge.  Continue to follow while inpatient. Follow glucose trends.   Thank you. Lorenda Peck, RD, LDN, CDE Inpatient Diabetes Coordinator [phone]

## 2019-08-04 LAB — COMPREHENSIVE METABOLIC PANEL
ALT: 13 U/L (ref 0–44)
AST: 11 U/L — ABNORMAL LOW (ref 15–41)
Albumin: 2.5 g/dL — ABNORMAL LOW (ref 3.5–5.0)
Alkaline Phosphatase: 65 U/L (ref 38–126)
Anion gap: 8 (ref 5–15)
BUN: 13 mg/dL (ref 8–23)
CO2: 27 mmol/L (ref 22–32)
Calcium: 8 mg/dL — ABNORMAL LOW (ref 8.9–10.3)
Chloride: 105 mmol/L (ref 98–111)
Creatinine, Ser: 0.62 mg/dL (ref 0.44–1.00)
GFR calc Af Amer: >60 mL/min (ref >60)
GFR calc non Af Amer: >60 mL/min (ref >60)
Glucose, Bld: 227 mg/dL — ABNORMAL HIGH (ref 70–99)
Potassium: 2.8 mmol/L — ABNORMAL LOW (ref 3.5–5.1)
Sodium: 140 mmol/L (ref 135–145)
Total Bilirubin: 0.5 mg/dL (ref 0.3–1.2)
Total Protein: 5.6 g/dL — ABNORMAL LOW (ref 6.5–8.1)

## 2019-08-04 LAB — CBC
HCT: 37.2 % (ref 36.0–46.0)
Hemoglobin: 12.5 g/dL (ref 12.0–15.0)
MCH: 29.6 pg (ref 26.0–34.0)
MCHC: 33.6 g/dL (ref 30.0–36.0)
MCV: 87.9 fL (ref 80.0–100.0)
Platelets: 288 10*3/uL (ref 150–400)
RBC: 4.23 MIL/uL (ref 3.87–5.11)
RDW: 13 % (ref 11.5–15.5)
WBC: 13.9 10*3/uL — ABNORMAL HIGH (ref 4.0–10.5)
nRBC: 0 % (ref 0.0–0.2)

## 2019-08-04 LAB — GLUCOSE, CAPILLARY
Glucose-Capillary: 189 mg/dL — ABNORMAL HIGH (ref 70–99)
Glucose-Capillary: 277 mg/dL — ABNORMAL HIGH (ref 70–99)
Glucose-Capillary: 300 mg/dL — ABNORMAL HIGH (ref 70–99)
Glucose-Capillary: 273 mg/dL — ABNORMAL HIGH (ref 70–99)

## 2019-08-04 MED ORDER — ENOXAPARIN SODIUM 40 MG/0.4ML ~~LOC~~ SOLN
40.0000 mg | SUBCUTANEOUS | Status: DC
  Administered 2019-08-04: 40 mg via SUBCUTANEOUS
  Filled 2019-08-04: qty 0.4

## 2019-08-04 MED ORDER — SODIUM CHLORIDE 0.9 % IV SOLN
1.0000 g | Freq: Three times a day (TID) | INTRAVENOUS | Status: DC
  Administered 2019-08-04: 1 g via INTRAVENOUS
  Filled 2019-08-04 (×2): qty 1

## 2019-08-04 MED ORDER — FAMOTIDINE IN NACL 20-0.9 MG/50ML-% IV SOLN
20.0000 mg | Freq: Once | INTRAVENOUS | Status: AC
  Administered 2019-08-04: 20 mg via INTRAVENOUS
  Filled 2019-08-04: qty 50

## 2019-08-04 MED ORDER — METHYLPREDNISOLONE SODIUM SUCC 40 MG IJ SOLR
40.0000 mg | Freq: Once | INTRAMUSCULAR | Status: AC
  Administered 2019-08-04: 40 mg via INTRAVENOUS
  Filled 2019-08-04: qty 1

## 2019-08-04 MED ORDER — POTASSIUM CHLORIDE CRYS ER 20 MEQ PO TBCR
40.0000 meq | EXTENDED_RELEASE_TABLET | ORAL | Status: AC
  Administered 2019-08-04 (×2): 40 meq via ORAL
  Filled 2019-08-04 (×2): qty 2

## 2019-08-04 MED ORDER — DIPHENHYDRAMINE HCL 50 MG/ML IJ SOLN
25.0000 mg | Freq: Once | INTRAMUSCULAR | Status: AC
  Administered 2019-08-04: 25 mg via INTRAVENOUS
  Filled 2019-08-04: qty 1

## 2019-08-04 MED ORDER — LACTATED RINGERS IV SOLN: INTRAVENOUS | Status: DC

## 2019-08-04 MED ORDER — PANTOPRAZOLE SODIUM 40 MG PO TBEC
40.0000 mg | DELAYED_RELEASE_TABLET | Freq: Every day | ORAL | Status: DC
  Administered 2019-08-04 – 2019-08-05 (×2): 40 mg via ORAL
  Filled 2019-08-04 (×2): qty 1

## 2019-08-04 MED ORDER — PIPERACILLIN-TAZOBACTAM 3.375 G IVPB 30 MIN: 3.3750 g | Freq: Four times a day (QID) | INTRAVENOUS | Status: DC

## 2019-08-04 MED ORDER — PIPERACILLIN-TAZOBACTAM 3.375 G IVPB
3.3750 g | Freq: Three times a day (TID) | INTRAVENOUS | Status: DC
  Administered 2019-08-04 – 2019-08-05 (×3): 3.375 g via INTRAVENOUS
  Filled 2019-08-04 (×3): qty 50

## 2019-08-04 NOTE — Evaluation (Signed)
Physical Therapy Evaluation Patient Details Name: [name] MRN: [id number] DOB: 03-03-1950 Today's Date: 08/04/2019   History of Present Illness  69 yo female admitted with diverticulitis. Hx of DM, fibromyalgia, diverticulitis  Clinical Impression  On eval, pt was Min guard assist for mobility. She walked ~150 feet with support of IV pole. She tolerated distance well. She politely declines HHPT f/u. She stated she has access to a RW at home. Encouraged pt to try to mobilize more with nursing supervision. Will continue to follow during hospital stay.     Follow Up Recommendations No PT follow up    Equipment Recommendations  None recommended by PT    Recommendations for Other Services       Precautions / Restrictions Precautions Precautions: Fall Restrictions Weight Bearing Restrictions: No      Mobility  Bed Mobility Overal bed mobility: Modified Independent                Transfers Overall transfer level: Needs assistance   Transfers: Sit to/from Stand Sit to Stand: Min guard         General transfer comment: Min guard for safety.  Ambulation/Gait Ambulation/Gait assistance: Min guard Gait Distance (Feet): 150 Feet Assistive device: IV Pole Gait Pattern/deviations: Step-through pattern;Decreased stride length     General Gait Details: Unsteady but no overt LOB with support of IV pole. Pt tolerated distance well.  Stairs            Wheelchair Mobility    Modified Rankin (Stroke Patients Only)       Balance Overall balance assessment: Needs assistance         Standing balance support: Single extremity supported Standing balance-Leahy Scale: Fair                               Pertinent Vitals/Pain Pain Assessment: No/denies pain    Home Living Family/patient expects to be discharged to:: Private residence Living Arrangements: Alone           Home Layout: One level Home Equipment: Environmental consultant - 2 wheels       Prior Function                 Hand Dominance        Extremity/Trunk Assessment   Upper Extremity Assessment Upper Extremity Assessment: Overall WFL for tasks assessed    Lower Extremity Assessment Lower Extremity Assessment: Generalized weakness    Cervical / Trunk Assessment Cervical / Trunk Assessment: Normal  Communication   Communication: No difficulties  Cognition Arousal/Alertness: Awake/alert Behavior During Therapy: WFL for tasks assessed/performed Overall Cognitive Status: Within Functional Limits for tasks assessed                                        General Comments      Exercises     Assessment/Plan    PT Assessment Patient needs continued PT services  PT Problem List Decreased strength;Decreased mobility;Decreased balance;Decreased activity tolerance       PT Treatment Interventions DME instruction;Gait training;Therapeutic activities;Therapeutic exercise;Patient/family education;Balance training;Functional mobility training    PT Goals (Current goals can be found in the Care Plan section)  Acute Rehab PT Goals Patient Stated Goal: home soon. PT Goal Formulation: With patient Time For Goal Achievement: 08/18/19 Potential to Achieve Goals: Good    Frequency Min 3X/week  Barriers to discharge        Co-evaluation               AM-PAC PT "6 Clicks" Mobility  Outcome Measure Help needed turning from your back to your side while in a flat bed without using bedrails?: A Little Help needed moving from lying on your back to sitting on the side of a flat bed without using bedrails?: A Little Help needed moving to and from a bed to a chair (including a wheelchair)?: A Little Help needed standing up from a chair using your arms (e.g., wheelchair or bedside chair)?: A Little Help needed to walk in hospital room?: A Little Help needed climbing 3-5 steps with a railing? : A Little 6 Click Score: 18    End of Session  Equipment Utilized During Treatment: Gait belt Activity Tolerance: Patient tolerated treatment well Patient left: in bed;with call bell/phone within reach   PT Visit Diagnosis: Unsteadiness on feet (R26.81);Muscle weakness (generalized) (M62.81)    Time: 1031-5945 PT Time Calculation (min) (ACUTE ONLY): 19 min   Charges:   PT Evaluation $PT Eval Low Complexity: [place], PT Acute Rehabilitation  Office: [phone] Pager: [phone]

## 2019-08-04 NOTE — Progress Notes (Signed)
PROGRESS NOTE    Syriana Croslin  ZRA:[id number] DOB: October 01, 1950 DOA: 08/02/2019 PCP: Leighton Ruff, MD    Brief Narrative:  Patient admitted to the hospital with a working diagnosis of acute diverticulitis.  69 year old female with past medical history for type 2 diabetes mellitus and dyslipidemia who presented with abdominal pain, diarrhea, nausea and vomiting.  She was diagnosed with diverticulitis July 06, 2019 in the ED, she was discharge home on ciprofloxacin and metronidazole.  At home she had recurrent abdominal pain, nausea and vomiting. She had a follow-up evaluation, she was found febrile and tachycardic, she was referred back to the hospital.  On her initial physical examination blood pressure 148/85, heart rate 94, temperature 98.5, respiratory rate 15, oxygen saturation 97%.  Her lungs are clear to auscultation bilaterally, heart S1-S2 present and rhythmic, her abdomen was soft but tender to palpation mainly in the lower quadrants, no rebound or guarding.  No lower extremity edema. Sodium 133, potassium 3.9, chloride 95, bicarb 20, glucose 460, BUN 35, creatinine 1.23, white count 22.9, hemoglobin 14.5, hematocrit 42.6, platelets 341. SARS Covid 19 negative.  CT of the abdomen pelvis with pancolonic thickening and pericolonic stranding.  Consistent with acute diverticulitis/colitis.  Assessment & Plan:   Principal Problem:   Diverticulitis Active Problems:   Type 2 diabetes mellitus with hyperglycemia, with long-term current use of insulin (HCC)   Essential hypertension   Diarrhea   Pancolitis ([place])   1. Acute diverticulitis/ with pan-colitis. Patient is feeling better today but not yet back to her baseline. Positive bowel movement today, with no diarrhea.  Wbc is 13 trending down from 16,9 yesterday. No nausea or vomiting today.   Patient had allergic skin reaction this am suspected to be related to ciprofloxacin and/ or metronidazole, improved with benadryl and  methylprednisolone. Will place patient on Zosyn and discontinue meropenem. Continue to follow up on cell count and GI symptoms.   Pain control with hydrocodone. Out of bed to chair tid with meals, PT and OT evaluation for ambulation.   2. Uncontrolled T2DM/ hyperglycemia. Fasting glucose this am is 227. Patient with no nausea or vomiting.  Continue insulin therapy with sliding scale, pre-meal insulin 4 units and basal insulin 20 units NPH bid.   3. HTN. Continue blood pressure control with metoprolol.   4. Hypokalemia. Serum K down to 2,8 with serum Cr at 0,62 and bicarbonate at 27. Continue gentle hydration with balanced electrolyte solutions.  K correction with Kcl 40 meq x 2 and check Mg in am.    Status is: Inpatient  Remains inpatient appropriate because:IV treatments appropriate due to intensity of illness or inability to take PO   Dispo: The patient is from: Home              Anticipated d/c is to: Home              Anticipated d/c date is: 1 day              Patient currently is not medically stable to d/c.   DVT prophylaxis: Enoxaparin   Code Status:   full  Family Communication:  No family at the bedside         Subjective: Patient is feeling better, but not yet back to baseline, continue to have abdominal pain but no nausea or vomiting,   Objective: Vitals:   08/03/19 0529 08/03/19 1340 08/03/19 2219 08/04/19 0631  BP: (!) 179/71 137/66 (!) 185/77 (!) 158/82  Pulse: 77 75 74  74  Resp: 15 19 18 18   Temp: 98.6 F (37 C) 98 F (36.7 C) 98.8 F (37.1 C) 98.1 F (36.7 C)  TempSrc: Oral Oral Oral Oral  SpO2: 97% 98% 94% 97%  Weight:      Height:        Intake/Output Summary (Last 24 hours) at 08/04/2019 1309 Last data filed at 08/04/2019 1150 Gross per 24 hour  Intake 3102.21 ml  Output 700 ml  Net 2402.21 ml   Filed Weights   08/02/19 1727  Weight: 87.1 kg    Examination:   General: Not in pain or dyspnea, deconditioned Neurology: Awake and  alert, non focal  E ENT: no pallor, no icterus, oral mucosa moist Cardiovascular: No JVD. S1-S2 present, rhythmic, no gallops, rubs, or murmurs. No lower extremity edema. Pulmonary: positive breath sounds bilaterally, adequate air movement, no wheezing, rhonchi or rales. Gastrointestinal. Abdomen soft with no organomegaly, non tender,mild distended Skin. No rashes Musculoskeletal: no joint deformities     Data Reviewed: I have personally reviewed following labs and imaging studies  CBC: Recent Labs  Lab 08/02/19 1006 08/03/19 0501 08/04/19 0503  WBC 22.9* 16.9* 13.9*  NEUTROABS 18.5*  --   --   HGB 14.5 13.1 12.5  HCT 42.6 39.4 37.2  MCV 89.3 90.6 87.9  PLT 341 289 299   Basic Metabolic Panel: Recent Labs  Lab 08/02/19 1006 08/02/19 1355 08/03/19 0501 08/04/19 0503  NA 133* 136 137 140  K 3.9 3.5 3.3* 2.8*  CL 95* 101 103 105  CO2 20* 22 20* 27  GLUCOSE 460* 320* 291* 227*  BUN 35* 31* 28* 13  CREATININE 1.23* 0.85 0.87 0.62  CALCIUM 8.9 8.4* 8.2* 8.0*   GFR: Estimated Creatinine Clearance: 71.3 mL/min (by C-G formula based on SCr of 0.62 mg/dL). Liver Function Tests: Recent Labs  Lab 08/02/19 1006 08/03/19 0501 08/04/19 0503  AST 14* 15 11*  ALT 15 13 13   ALKPHOS 95 76 65  BILITOT 0.8 0.9 0.5  PROT 7.6 6.1* 5.6*  ALBUMIN 3.2* 2.7* 2.5*   Recent Labs  Lab 08/02/19 1006  LIPASE 21   No results for input(s): AMMONIA in the last 168 hours. Coagulation Profile: No results for input(s): INR, PROTIME in the last 168 hours. Cardiac Enzymes: No results for input(s): CKTOTAL, CKMB, CKMBINDEX, TROPONINI in the last 168 hours. BNP (last 3 results) No results for input(s): PROBNP in the last 8760 hours. HbA1C: Recent Labs    08/02/19 1006  HGBA1C 9.0*   CBG: Recent Labs  Lab 08/03/19 1126 08/03/19 1618 08/03/19 2130 08/04/19 0730 08/04/19 1125  GLUCAP 269* 256* 214* 189* 277*   Lipid Profile: No results for input(s): CHOL, HDL, LDLCALC, TRIG,  CHOLHDL, LDLDIRECT in the last 72 hours. Thyroid Function Tests: No results for input(s): TSH, T4TOTAL, FREET4, T3FREE, THYROIDAB in the last 72 hours. Anemia Panel: No results for input(s): VITAMINB12, FOLATE, FERRITIN, TIBC, IRON, RETICCTPCT in the last 72 hours.    Radiology Studies: I have reviewed all of the imaging during this hospital visit personally     Scheduled Meds: . fluticasone  1 spray Each Nare Daily  . heparin  5,000 Units Subcutaneous Q8H  . insulin aspart  0-15 Units Subcutaneous TID WC  . insulin aspart  4 Units Subcutaneous TID WC  . insulin NPH Human  20 Units Subcutaneous BID AC & HS  . metoprolol tartrate  25 mg Oral BID   Continuous Infusions: . meropenem (MERREM) IV 1 g (08/04/19  2591)     LOS: 2 days        Srijan Givan Gerome Apley, MD

## 2019-08-04 NOTE — Progress Notes (Signed)
Noticed a break out of hives and rash on patient inner thighs, abdomen and both arms, patient denies itching,swelling and shortness of breathing. Paged on call provider Dr. Hal Hope new orders was given and carried out. We will continue to monitor.

## 2019-08-04 NOTE — Progress Notes (Signed)
Pharmacy Antibiotic Note  [name] is a 69 y.o. female admitted on 08/02/2019 with Intra-abdominal infection.  Pharmacy has been consulted for meropenem dosing.  See Dr. Hal Hope note 08/04/19 at [facility].  Plan: Meropenem 1gm iv q8hr  Height: 5\' 5"  (165.1 cm) Weight: 87.1 kg (192 lb 0.3 oz) IBW/kg (Calculated) : 57  Temp (24hrs), Avg:98.4 F (36.9 C), Min:98 F (36.7 C), Max:98.8 F (37.1 C)  Recent Labs  Lab 08/02/19 1006 08/02/19 1355 08/03/19 0501 08/04/19 0503  WBC 22.9*  --  16.9* 13.9*  CREATININE 1.23* 0.85 0.87 0.62    Estimated Creatinine Clearance: 71.3 mL/min (by C-G formula based on SCr of 0.62 mg/dL).    Allergies  Allergen Reactions  . Contrast Media [Iodinated Diagnostic Agents] Hives  . Erythromycin Anaphylaxis  . Statins Other (See Comments)    Severe muscle pain/weakness  . Augmentin [Amoxicillin-Pot Clavulanate] Nausea And Vomiting    Nausea, vomiting, headache, dizziness   pt states she can take other Penicillins just not augmentin  . Basaglar Cla[place]orne Rigg [Insulin Glargine]     Severe ankle swelling  . Metformin And Related Diarrhea and Nausea And Vomiting    Violently ill for 2 yrs  . Nsaids Other (See Comments)    Stomach problems  . Prednisone Other (See Comments)    Abnormal blood sugars..  . Scallops [Shellfish Allergy] Diarrhea and Nausea And Vomiting    Severe GI upset  . Sulfa Antibiotics Other (See Comments)    blackout  . Invokana [Canagliflozin] Rash and Other (See Comments)    Dehydration  . Tramadol Rash    Antimicrobials this admission: 6/28 CTX>> 6/30 6/28 flagyl>> 6/30 6/30 meropenem >>   Dose adjustments this admission: -  Microbiology results: -  Thank you for allowing pharmacy to be a part of this patient's care.  Nani Skillern Crowford 08/04/2019 6:21 AM

## 2019-08-04 NOTE — Significant Event (Signed)
Was notified by the nurse that patient developed diffuse rash around the abdomen and lower extremities.  No tongue swelling or difficulty breathing.  Not sure if patient was allergic to either ceftriaxone or Flagyl advised pharmacy to stop both of them and starting on meropenem giving patient Solu-Medrol IV Benadryl and Pepcid.  We will closely monitor.  Gean Birchwood.

## 2019-08-05 LAB — CBC WITH DIFFERENTIAL/PLATELET
Abs Immature Granulocytes: 1.03 10*3/uL — ABNORMAL HIGH (ref 0.00–0.07)
Basophils Absolute: 0.1 10*3/uL (ref 0.0–0.1)
Basophils Relative: 1 %
Eosinophils Absolute: 0 10*3/uL (ref 0.0–0.5)
Eosinophils Relative: 0 %
HCT: 39.4 % (ref 36.0–46.0)
Hemoglobin: 12.8 g/dL (ref 12.0–15.0)
Immature Granulocytes: 7 %
Lymphocytes Relative: 24 %
Lymphs Abs: 3.4 10*3/uL (ref 0.7–4.0)
MCH: 29.3 pg (ref 26.0–34.0)
MCHC: 32.5 g/dL (ref 30.0–36.0)
MCV: 90.2 fL (ref 80.0–100.0)
Monocytes Absolute: 1.5 10*3/uL — ABNORMAL HIGH (ref 0.1–1.0)
Monocytes Relative: 11 %
Neutro Abs: 8.2 10*3/uL — ABNORMAL HIGH (ref 1.7–7.7)
Neutrophils Relative %: 57 %
Platelets: 273 10*3/uL (ref 150–400)
RBC: 4.37 MIL/uL (ref 3.87–5.11)
RDW: 13 % (ref 11.5–15.5)
WBC: 14.4 10*3/uL — ABNORMAL HIGH (ref 4.0–10.5)
nRBC: 0 % (ref 0.0–0.2)

## 2019-08-05 LAB — COMPREHENSIVE METABOLIC PANEL
ALT: 12 U/L (ref 0–44)
AST: 11 U/L — ABNORMAL LOW (ref 15–41)
Albumin: 2.5 g/dL — ABNORMAL LOW (ref 3.5–5.0)
Alkaline Phosphatase: 60 U/L (ref 38–126)
Anion gap: 9 (ref 5–15)
BUN: 20 mg/dL (ref 8–23)
CO2: 25 mmol/L (ref 22–32)
Calcium: 8.4 mg/dL — ABNORMAL LOW (ref 8.9–10.3)
Chloride: 106 mmol/L (ref 98–111)
Creatinine, Ser: 0.75 mg/dL (ref 0.44–1.00)
GFR calc Af Amer: >60 mL/min (ref >60)
GFR calc non Af Amer: >60 mL/min (ref >60)
Glucose, Bld: 272 mg/dL — ABNORMAL HIGH (ref 70–99)
Potassium: 3.8 mmol/L (ref 3.5–5.1)
Sodium: 140 mmol/L (ref 135–145)
Total Bilirubin: 0.6 mg/dL (ref 0.3–1.2)
Total Protein: 5.5 g/dL — ABNORMAL LOW (ref 6.5–8.1)

## 2019-08-05 LAB — GLUCOSE, CAPILLARY
Glucose-Capillary: 246 mg/dL — ABNORMAL HIGH (ref 70–99)
Glucose-Capillary: 268 mg/dL — ABNORMAL HIGH (ref 70–99)

## 2019-08-05 LAB — MAGNESIUM: Magnesium: 1.9 mg/dL (ref 1.7–2.4)

## 2019-08-05 MED ORDER — ONDANSETRON HCL 4 MG PO TABS: 4.0000 mg | ORAL_TABLET | Freq: Four times a day (QID) | ORAL | 0 refills | Status: DC | PRN

## 2019-08-05 MED ORDER — AMOXICILLIN-POT CLAVULANATE 875-125 MG PO TABS: 1.0000 | ORAL_TABLET | Freq: Two times a day (BID) | ORAL | 0 refills | Status: AC

## 2019-08-05 MED ORDER — AMOXICILLIN-POT CLAVULANATE 875-125 MG PO TABS
1.0000 | ORAL_TABLET | Freq: Two times a day (BID) | ORAL | Status: DC
  Administered 2019-08-05: 1 via ORAL
  Filled 2019-08-05: qty 1

## 2019-08-05 NOTE — Plan of Care (Signed)

## 2019-08-05 NOTE — Evaluation (Signed)
Occupational Therapy Evaluation Patient Details Name: [name] MRN: [id number] DOB: 1950-12-30 Today's Date: 08/05/2019    History of Present Illness 69 yo female admitted with diverticulitis. Hx of DM, fibromyalgia, diverticulitis   Clinical Impression   Patient independent with self care and functional transfers in her room, no physical assist needed. Will discontinue acute OT at this time, please re-consult if new needs arise.    Follow Up Recommendations  No OT follow up    Equipment Recommendations  None recommended by OT       Precautions / Restrictions Precautions Precautions: None Restrictions Weight Bearing Restrictions: No      Mobility Bed Mobility Overal bed mobility: Modified Independent                Transfers Overall transfer level: Independent Equipment used: None                  Balance Overall balance assessment: Independent                                         ADL either performed or assessed with clinical judgement   ADL Overall ADL's : Independent                                       General ADL Comments: patient able to don socks, ambulate in room/perform transfers without assistance,                  Pertinent Vitals/Pain Pain Assessment: No/denies pain     Hand Dominance Right   Extremity/Trunk Assessment Upper Extremity Assessment Upper Extremity Assessment: Overall WFL for tasks assessed   Lower Extremity Assessment Lower Extremity Assessment: Defer to PT evaluation   Cervical / Trunk Assessment Cervical / Trunk Assessment: Normal   Communication Communication Communication: No difficulties   Cognition Arousal/Alertness: Awake/alert Behavior During Therapy: WFL for tasks assessed/performed Overall Cognitive Status: Within Functional Limits for tasks assessed                                                Home Living Family/patient  expects to be discharged to:: Private residence Living Arrangements: Alone   Type of Home: House Home Access: Stairs to enter Technical brewer of Steps: 8 Entrance Stairs-Rails:  (1 rail) Home Layout: One level     Bathroom Shower/Tub: Teacher, early years/pre: Standard     Home Equipment: Environmental consultant - 2 wheels;Grab bars - tub/shower          Prior Functioning/Environment Level of Independence: Independent                 OT Problem List: Decreased activity tolerance       AM-PAC OT "6 Clicks" Daily Activity     Outcome Measure Help from another person eating meals?: None Help from another person taking care of personal grooming?: None Help from another person toileting, which includes using toliet, bedpan, or urinal?: None Help from another person bathing (including washing, rinsing, drying)?: None Help from another person to put on and taking off regular upper body clothing?: None Help from another person to put on and taking off regular  lower body clothing?: None 6 Click Score: 24   End of Session Nurse Communication: Mobility status  Activity Tolerance: Patient tolerated treatment well Patient left: in chair;with call bell/phone within reach  OT Visit Diagnosis: Unsteadiness on feet (R26.81)                Time: 2836-6294 OT Time Calculation (min): 14 min Charges:  OT General Charges $OT Visit: 1 Visit OT Evaluation $OT Eval Low Complexity: [place] OT Pager: [phone]  Rosemary Holms 08/05/2019, 7:58 AM

## 2019-08-05 NOTE — Care Management Important Message (Signed)
Important Message  Patient Details IM Letter given to [facility] Case Manager to present to the Patient Name: Samai Corea MRN: [id number] Date of Birth: 27-Feb-1950   Medicare Important Message Given:  Yes     Kerin Salen 08/05/2019, 11:08 AM

## 2019-08-05 NOTE — Discharge Summary (Signed)
Physician Discharge Summary  [name] EGB:[id number] DOB: 08/13/1950 DOA: 08/02/2019  PCP: Leighton Ruff, MD  Admit date: 08/02/2019 Discharge date: 08/05/2019  Admitted From: Home  Disposition:  Home   Recommendations for Outpatient Follow-up and new medication changes:  1. Follow up with Dr. Drema Dallas in 7 days.  2. Continue antibiotic therapy with Augmentin for 10 more days.  3. As needed zofran for nausea 4. Patient had skin rash after Ceftriaxone infusion, now listed under allergies.   Home Health: no   Equipment/Devices: no    Discharge Condition: stable  CODE STATUS: full  Diet recommendation: heart healthy   Brief/Interim Summary: Patient admitted to the hospital with a working diagnosis of acute diverticulitis.  69 year old female with past medical history for type 2 diabetes mellitus and dyslipidemia who presented with abdominal pain, diarrhea, nausea and vomiting.  She was diagnosed with diverticulitis July 06, 2019 in the ED, she was discharge home on ciprofloxacin and metronidazole.  At home she had recurrent abdominal pain, nausea and vomiting. She had a follow-up evaluation, and she was found febrile and tachycardic, she was referred back to the hospital.  On her ED physical examination her blood pressure was 148/85, heart rate 94, temperature 98.5, respiratory rate 15, oxygen saturation 97%.  Her lungs are clear to auscultation bilaterally, heart S1-S2 present and rhythmic, her abdomen was soft but tender to palpation mainly in the lower quadrants, no rebound or guarding.  No lower extremity edema. Sodium 133, potassium 3.9, chloride 95, bicarb 20, glucose 460, BUN 35, creatinine 1.23, white count 22.9, hemoglobin 14.5, hematocrit 42.6, platelets 341. SARS Covid 19 negative.  CT of the abdomen pelvis with pancolonic thickening and pericolonic stranding.  Consistent with acute diverticulitis/colitis  1.  Acute diverticulitis/pancolitis.  Patient was admitted to the  medical ward, she received intravenous antibiotic therapy with ceftriaxone and metronidazole, posteriorly transition to Zosyn with good toleration.  Her discharge white cell count is 14.4, her abdominal pain and diarrhea have improved.  No nausea or vomiting.  Stool was negative for C. difficile and the rest of the stool panel was negative.  Patient will be discharged home with antibiotic therapy, Augmentin for 10 more days.  Would recommend outpatient GI follow-up for colonoscopy once symptoms resolved.  2.  Uncontrolled diabetes mellitus, hyperglycemia.  Patient received insulin therapy while hospitalized, sliding scale, premeal basal.  At her discharge her glucose fasting is 272.  Continue home insulin regimen.  3.  Hypertension.  Continue blood pressure control with metoprolol and will resume HCTZ at discharge.   4.  Hypokalemia.  Patient received intravenous fluids, kidney function remained stable, potassium was corrected with potassium chloride. Her discharge potassium was 3.8, sodium 140, bicarb 25, serum creatinine 0.75.   Discharge Diagnoses:  Principal Problem:   Diverticulitis Active Problems:   Type 2 diabetes mellitus with hyperglycemia, with long-term current use of insulin (HCC)   Essential hypertension   Diarrhea   Pancolitis ([place])    Discharge Instructions   Allergies as of 08/05/2019      Reactions   Contrast Media [iodinated Diagnostic Agents] Hives   Erythromycin Anaphylaxis   Rocephin [ceftriaxone] Hives, Itching   Statins Other (See Comments)   Severe muscle pain/weakness   Augmentin [amoxicillin-pot Clavulanate] Nausea And Vomiting   Nausea, vomiting, headache, dizziness  pt states she can take other Penicillins just not augmentin   Basaglar Kwikpen [insulin Glargine]    Severe ankle swelling   Metformin And Related Diarrhea, Nausea And Vomiting  Violently ill for 2 yrs   Nsaids Other (See Comments)   Stomach problems   Prednisone Other (See Comments)    Abnormal blood sugars.Elyse Hsu [shellfish Allergy] Diarrhea, Nausea And Vomiting   Severe GI upset   Sulfa Antibiotics Other (See Comments)   blackout   Invokana [canagliflozin] Rash, Other (See Comments)   Dehydration   Tramadol Rash      Medication List    STOP taking these medications   ciprofloxacin 500 MG tablet Commonly known as: CIPRO   metroNIDAZOLE 500 MG tablet Commonly known as: FLAGYL     TAKE these medications   accu-chek multiclix lancets Use as instructed to test once daily DX E11.65   Accu-Chek SmartView test strip Generic drug: glucose blood USE AS INSTRUCTED TO TEST TWICE DAILY   acetaminophen 650 MG CR tablet Commonly known as: TYLENOL Take 650 mg by mouth every 8 (eight) hours as needed for pain.   acetaminophen-codeine 300-30 MG tablet Commonly known as: TYLENOL #3 Take 1 tablet by mouth 2 (two) times daily as needed for severe pain.   amoxicillin-clavulanate 875-125 MG tablet Commonly known as: AUGMENTIN Take 1 tablet by mouth every 12 (twelve) hours for 10 days.   cholecalciferol 1000 units tablet Commonly known as: VITAMIN D Take 1,000 Units by mouth every evening.   fluticasone 50 MCG/ACT nasal spray Commonly known as: FLONASE Place 1 spray into both nostrils daily.   hydrochlorothiazide 25 MG tablet Commonly known as: HYDRODIURIL Take 12.5 mg by mouth daily.   HYDROcodone-acetaminophen 7.5-325 MG tablet Commonly known as: NORCO Take 1 tablet by mouth every 6 (six) hours as needed (Migraine headaches).   ibuprofen 200 MG tablet Commonly known as: ADVIL Take 200 mg by mouth every 8 (eight) hours as needed for mild pain.   insulin NPH Human 100 UNIT/ML injection Commonly known as: NovoLIN N Inject 18 units in am and 25 units in pm What changed:   how much to take  how to take this  when to take this  additional instructions   insulin regular 100 units/mL injection Commonly known as: NovoLIN R ReliOn Inject  0.08-0.1 mLs (8-10 Units total) into the skin 2 (two) times daily before a meal. What changed: how much to take   metoprolol tartrate 50 MG tablet Commonly known as: LOPRESSOR Take 25 mg by mouth 2 (two) times daily.   ondansetron 4 MG tablet Commonly known as: ZOFRAN Take 1 tablet (4 mg total) by mouth every 6 (six) hours as needed for nausea.   polyvinyl alcohol 1.4 % ophthalmic solution Commonly known as: LIQUIFILM TEARS Place 1 drop into both eyes every 6 (six) hours as needed for dry eyes.   PRESCRIPTION MEDICATION Patient receives a Steroid injection in the neck once a year with Dr. Mina Marble at [facility] pt is unable to recall the exact name and i have been unable to verify name from the Dr's office. Patient also states " it shoots up my blood sugars really high, but i still get it because i can't take anything else for my neck pain"   tiZANidine 4 MG tablet Commonly known as: ZANAFLEX Take 4 mg by mouth at bedtime as needed for muscle spasms.       Follow-up Information    Leighton Ruff, MD Follow up in 1 week(s).   Specialty: Family Medicine Contact information: [place] [address] [phone]              Allergies  Allergen Reactions  . Contrast Media [Iodinated Diagnostic Agents] Hives  . Erythromycin Anaphylaxis  . Rocephin [Ceftriaxone] Hives and Itching  . Statins Other (See Comments)    Severe muscle pain/weakness  . Augmentin [Amoxicillin-Pot Clavulanate] Nausea And Vomiting    Nausea, vomiting, headache, dizziness   pt states she can take other Penicillins just not augmentin  . Basaglar Claiborne Rigg [Insulin Glargine]     Severe ankle swelling  . Metformin And Related Diarrhea and Nausea And Vomiting    Violently ill for 2 yrs  . Nsaids Other (See Comments)    Stomach problems  . Prednisone Other (See Comments)    Abnormal blood sugars..  . Scallops [Shellfish Allergy] Diarrhea and Nausea And Vomiting    Severe GI  upset  . Sulfa Antibiotics Other (See Comments)    blackout  . Invokana [Canagliflozin] Rash and Other (See Comments)    Dehydration  . Tramadol Rash      Procedures/Studies: CT Abdomen Pelvis Wo Contrast  Result Date: 08/02/2019 CLINICAL DATA:  Diarrhea with epigastric abdominal pain EXAM: CT ABDOMEN AND PELVIS WITHOUT CONTRAST TECHNIQUE: Multidetector CT imaging of the abdomen and pelvis was performed following the standard protocol without IV contrast. COMPARISON:  07/06/2019 FINDINGS: Lower chest: No consolidation.  No pleural effusion. Hepatobiliary: Post cholecystectomy. Liver normal in size and contour. Pancreas: Normal contour, no inflammatory changes about the pancreas. Spleen: Spleen normal in size and contour. Adrenals/Urinary Tract: Adrenal glands are normal. Kidneys with smooth renal contours. No hydronephrosis. No nephrolithiasis. Stomach/Bowel: Stomach and small bowel are normal. No bowel obstruction. Appendix is normal. Pancolonic thickening and pericolonic stranding. Stranding about the sigmoid slightly more intense than other areas. Small free fluid in the pelvis. No free air.  Trace fluid in the pelvis.  No pneumatosis. Vascular/Lymphatic: Calcified atheromatous plaque of the abdominal aorta. No adenopathy. No pelvic lymphadenopathy. Reproductive: No adnexal masses. Stranding about the LEFT adnexa more closely associated with the colon. Other: Trace free fluid in the pelvis as discussed. No free air. No abdominal wall hernia. Musculoskeletal: Spinal degenerative change. No acute bone finding or destructive bone process. IMPRESSION: 1. Pancolonic thickening and pericolonic stranding. Stranding about the sigmoid slightly more intense than other areas. Findings were compatible with acute diverticulitis on the previous exam now with superimposed pancolonic inflammation suggestive of infectious colitis such as C diff colitis in the current context. 2. Aortic atherosclerosis. Aortic  Atherosclerosis (ICD10-I70.0). Electronically Signed   By: Zetta Bills M.D.   On: 08/02/2019 12:34   DG Chest 2 View  Result Date: 08/02/2019 CLINICAL DATA:  Palpitations and chest pain. EXAM: CHEST - 2 VIEW COMPARISON:  None. FINDINGS: The cardiac silhouette, mediastinal and hilar contours are within normal limits. Mild tortuosity and calcification of the thoracic aorta is noted. The lungs are clear. No pleural effusions. The bony thorax is intact. IMPRESSION: No acute cardiopulmonary findings. Electronically Signed   By: Marijo Sanes M.D.   On: 08/02/2019 10:44   CT Renal Stone Study  Result Date: 07/06/2019 CLINICAL DATA:  Flank pain EXAM: CT ABDOMEN AND PELVIS WITHOUT CONTRAST TECHNIQUE: Multidetector CT imaging of the abdomen and pelvis was performed following the standard protocol without IV contrast. COMPARISON:  None. FINDINGS: Lower chest: Lung bases are clear. No effusions. Heart is normal size. Hepatobiliary: No focal liver abnormality is seen. Status post cholecystectomy. No biliary dilatation. Pancreas: No focal abnormality or ductal dilatation. Spleen: No focal abnormality.  Normal size. Adrenals/Urinary Tract: No adrenal abnormality. No focal renal abnormality. No stones or  hydronephrosis. Urinary bladder is unremarkable. Stomach/Bowel: Sigmoid diverticulosis. Inflammatory stranding around the mid sigmoid colon compatible with active diverticulitis. Stomach and small bowel decompressed, unremarkable. Normal appendix. Vascular/Lymphatic: Aortic atherosclerosis. No enlarged abdominal or pelvic lymph nodes. Reproductive: Uterus and adnexa unremarkable.  No mass. Other: No free fluid or free air. Musculoskeletal: No acute bony abnormality. Degenerative changes in the lumbar spine. IMPRESSION: No renal or ureteral stones.  No hydronephrosis. Sigmoid diverticulosis with surrounding inflammation compatible with active diverticulitis. Aortic atherosclerosis. Electronically Signed   By: Rolm Baptise  M.D.   On: 07/06/2019 19:09        Subjective: Patient is feeling better, no nausea or vomiting, no chest pain or dyspnea, abdominal pain continue to improve, no further diarrhea.   Discharge Exam: Vitals:   08/04/19 2250 08/05/19 0645  BP: (!) 160/81 (!) 167/66  Pulse: 76 66  Resp: 18 18  Temp: 98.3 F (36.8 C) 98.3 F (36.8 C)  SpO2: 95% 97%   Vitals:   08/04/19 0631 08/04/19 1340 08/04/19 2250 08/05/19 0645  BP: (!) 158/82 (!) 153/94 (!) 160/81 (!) 167/66  Pulse: 74 80 76 66  Resp: 18 16 18 18   Temp: 98.1 F (36.7 C) 98 F (36.7 C) 98.3 F (36.8 C) 98.3 F (36.8 C)  TempSrc: Oral Oral Oral Oral  SpO2: 97% 96% 95% 97%  Weight:      Height:        General: Not in pain or dyspnea.  Neurology: Awake and alert, non focal  E ENT: no pallor, no icterus, oral mucosa moist Cardiovascular: No JVD. S1-S2 present, rhythmic, no gallops, rubs, or murmurs. No lower extremity edema. Pulmonary: positive breath sounds bilaterally, adequate air movement, no wheezing, rhonchi or rales. Gastrointestinal. Abdomen soft with no organomegaly, non tender, no rebound or guarding Skin. No rashes Musculoskeletal: no joint deformities   The results of significant diagnostics from this hospitalization (including imaging, microbiology, ancillary and laboratory) are listed below for reference.     Microbiology: Recent Results (from the past 240 hour(s))  SARS Coronavirus 2 by RT PCR (hospital order, performed in [facility] hospital lab) Nasopharyngeal Nasopharyngeal Swab     Status: None   Collection Time: 08/02/19  1:55 PM   Specimen: Nasopharyngeal Swab  Result Value Ref Range Status   SARS Coronavirus 2 NEGATIVE NEGATIVE Final    Comment: (NOTE) SARS-CoV-2 target nucleic acids are NOT DETECTED.  The SARS-CoV-2 RNA is generally detectable in upper and lower respiratory specimens during the acute phase of infection. The lowest concentration of SARS-CoV-2 viral copies this assay can  detect is 250 copies / mL. A negative result does not preclude SARS-CoV-2 infection and should not be used as the sole basis for treatment or other patient management decisions.  A negative result may occur with improper specimen collection / handling, submission of specimen other than nasopharyngeal swab, presence of viral mutation(s) within the areas targeted by this assay, and inadequate number of viral copies (<250 copies / mL). A negative result must be combined with clinical observations, patient history, and epidemiological information.  Fact Sheet for Patients:   StrictlyIdeas.no  Fact Sheet for Healthcare Providers: BankingDealers.co.za  This test is not yet approved or  cleared by the [place] FDA and has been authorized for detection and/or diagnosis of SARS-CoV-2 by FDA under an Emergency Use Authorization (EUA).  This EUA will remain in effect (meaning this test can be used) for the duration of the COVID-19 declaration under Section 564(b)(1) of the Act,  21 U.S.C. section 360bbb-3(b)(1), unless the authorization is terminated or revoked sooner.  Performed at [facility], [place] [address], [place], [place] [address]   C Difficile Quick Screen w PCR reflex     Status: None   Collection Time: 08/02/19  4:24 PM   Specimen: STOOL  Result Value Ref Range Status   C Diff antigen NEGATIVE NEGATIVE Final   C Diff toxin NEGATIVE NEGATIVE Final   C Diff interpretation No C. difficile detected.  Final    Comment: Performed at [facility], [place] [address], [place], [place] [address]  Gastrointestinal Panel by PCR , Stool     Status: None   Collection Time: 08/02/19  4:24 PM   Specimen: STOOL  Result Value Ref Range Status   Campylobacter species NOT DETECTED NOT DETECTED Final   Plesimonas shigelloides NOT DETECTED NOT DETECTED Final   Salmonella species NOT DETECTED NOT DETECTED Final    Yersinia enterocolitica NOT DETECTED NOT DETECTED Final   Vibrio species NOT DETECTED NOT DETECTED Final   Vibrio cholerae NOT DETECTED NOT DETECTED Final   Enteroaggregative E coli (EAEC) NOT DETECTED NOT DETECTED Final   Enteropathogenic E coli (EPEC) NOT DETECTED NOT DETECTED Final   Enterotoxigenic E coli (ETEC) NOT DETECTED NOT DETECTED Final   Shiga like toxin producing E coli (STEC) NOT DETECTED NOT DETECTED Final   Shigella/Enteroinvasive E coli (EIEC) NOT DETECTED NOT DETECTED Final   Cryptosporidium NOT DETECTED NOT DETECTED Final   Cyclospora cayetanensis NOT DETECTED NOT DETECTED Final   Entamoeba histolytica NOT DETECTED NOT DETECTED Final   Giardia lamblia NOT DETECTED NOT DETECTED Final   Adenovirus F40/41 NOT DETECTED NOT DETECTED Final   Astrovirus NOT DETECTED NOT DETECTED Final   Norovirus GI/GII NOT DETECTED NOT DETECTED Final   Rotavirus A NOT DETECTED NOT DETECTED Final   Sapovirus (I, II, IV, and V) NOT DETECTED NOT DETECTED Final    Comment: Performed at [facility], [place] [place],  [address]     Labs: BNP (last 3 results) No results for input(s): BNP in the last 8760 hours. Basic Metabolic Panel: Recent Labs  Lab 08/02/19 1006 08/02/19 1355 08/03/19 0501 08/04/19 0503 08/05/19 0508  NA 133* 136 137 140 140  K 3.9 3.5 3.3* 2.8* 3.8  CL 95* 101 103 105 106  CO2 20* 22 20* 27 25  GLUCOSE 460* 320* 291* 227* 272*  BUN 35* 31* 28* 13 20  CREATININE 1.23* 0.85 0.87 0.62 0.75  CALCIUM 8.9 8.4* 8.2* 8.0* 8.4*  MG  --   --   --   --  1.9   Liver Function Tests: Recent Labs  Lab 08/02/19 1006 08/03/19 0501 08/04/19 0503 08/05/19 0508  AST 14* 15 11* 11*  ALT 15 13 13 12   ALKPHOS 95 76 65 60  BILITOT 0.8 0.9 0.5 0.6  PROT 7.6 6.1* 5.6* 5.5*  ALBUMIN 3.2* 2.7* 2.5* 2.5*   Recent Labs  Lab 08/02/19 1006  LIPASE 21   No results for input(s): AMMONIA in the last 168 hours. CBC: Recent Labs  Lab 08/02/19 1006  08/03/19 0501 08/04/19 0503 08/05/19 0508  WBC 22.9* 16.9* 13.9* 14.4*  NEUTROABS 18.5*  --   --  8.2*  HGB 14.5 13.1 12.5 12.8  HCT 42.6 39.4 37.2 39.4  MCV 89.3 90.6 87.9 90.2  PLT 341 289 288 273   Cardiac Enzymes: No results for input(s): CKTOTAL, CKMB, CKMBINDEX, TROPONINI in the last 168 hours.  BNP: Invalid input(s): POCBNP CBG: Recent Labs  Lab 08/04/19 0730 08/04/19 1125 08/04/19 1639 08/04/19 2226 08/05/19 0733  GLUCAP 189* 277* 300* 273* 246*   D-Dimer No results for input(s): DDIMER in the last 72 hours. Hgb A1c No results for input(s): HGBA1C in the last 72 hours. Lipid Profile No results for input(s): CHOL, HDL, LDLCALC, TRIG, CHOLHDL, LDLDIRECT in the last 72 hours. Thyroid function studies No results for input(s): TSH, T4TOTAL, T3FREE, THYROIDAB in the last 72 hours.  Invalid input(s): FREET3 Anemia work up No results for input(s): VITAMINB12, FOLATE, FERRITIN, TIBC, IRON, RETICCTPCT in the last 72 hours. Urinalysis    Component Value Date/Time   COLORURINE YELLOW 08/02/2019 1006   APPEARANCEUR CLEAR 08/02/2019 1006   LABSPEC 1.029 08/02/2019 1006   PHURINE 6.0 08/02/2019 1006   GLUCOSEU >=500 (A) 08/02/2019 1006   HGBUR SMALL (A) 08/02/2019 1006   BILIRUBINUR NEGATIVE 08/02/2019 1006   KETONESUR 80 (A) 08/02/2019 1006   PROTEINUR NEGATIVE 08/02/2019 1006   UROBILINOGEN 0.2 05/12/2011 1833   NITRITE NEGATIVE 08/02/2019 1006   LEUKOCYTESUR TRACE (A) 08/02/2019 1006   Sepsis Labs Invalid input(s): PROCALCITONIN,  WBC,  LACTICIDVEN Microbiology Recent Results (from the past 240 hour(s))  SARS Coronavirus 2 by RT PCR (hospital order, performed in [place] hospital lab) Nasopharyngeal Nasopharyngeal Swab     Status: None   Collection Time: 08/02/19  1:55 PM   Specimen: Nasopharyngeal Swab  Result Value Ref Range Status   SARS Coronavirus 2 NEGATIVE NEGATIVE Final    Comment: (NOTE) SARS-CoV-2 target nucleic acids are NOT DETECTED.  The  SARS-CoV-2 RNA is generally detectable in upper and lower respiratory specimens during the acute phase of infection. The lowest concentration of SARS-CoV-2 viral copies this assay can detect is 250 copies / mL. A negative result does not preclude SARS-CoV-2 infection and should not be used as the sole basis for treatment or other patient management decisions.  A negative result may occur with improper specimen collection / handling, submission of specimen other than nasopharyngeal swab, presence of viral mutation(s) within the areas targeted by this assay, and inadequate number of viral copies (<250 copies / mL). A negative result must be combined with clinical observations, patient history, and epidemiological information.  Fact Sheet for Patients:   StrictlyIdeas.no  Fact Sheet for Healthcare Providers: BankingDealers.co.za  This test is not yet approved or  cleared by the [place] FDA and has been authorized for detection and/or diagnosis of SARS-CoV-2 by FDA under an Emergency Use Authorization (EUA).  This EUA will remain in effect (meaning this test can be used) for the duration of the COVID-19 declaration under Section 564(b)(1) of the Act, 21 U.S.C. section 360bbb-3(b)(1), unless the authorization is terminated or revoked sooner.  Performed at [facility], [place] [address] [place], [place] [address]   C Difficile Quick Screen w PCR reflex     Status: None   Collection Time: 08/02/19  4:24 PM   Specimen: STOOL  Result Value Ref Range Status   C Diff antigen NEGATIVE NEGATIVE Final   C Diff toxin NEGATIVE NEGATIVE Final   C Diff interpretation No C. difficile detected.  Final    Comment: Performed at [facility], [place] [address], [place], [place] [address]  Gastrointestinal Panel by PCR , Stool     Status: None   Collection Time: 08/02/19  4:24 PM   Specimen: STOOL  Result Value Ref  Range Status   Campylobacter species NOT DETECTED  NOT DETECTED Final   Plesimonas shigelloides NOT DETECTED NOT DETECTED Final   Salmonella species NOT DETECTED NOT DETECTED Final   Yersinia enterocolitica NOT DETECTED NOT DETECTED Final   Vibrio species NOT DETECTED NOT DETECTED Final   Vibrio cholerae NOT DETECTED NOT DETECTED Final   Enteroaggregative E coli (EAEC) NOT DETECTED NOT DETECTED Final   Enteropathogenic E coli (EPEC) NOT DETECTED NOT DETECTED Final   Enterotoxigenic E coli (ETEC) NOT DETECTED NOT DETECTED Final   Shiga like toxin producing E coli (STEC) NOT DETECTED NOT DETECTED Final   Shigella/Enteroinvasive E coli (EIEC) NOT DETECTED NOT DETECTED Final   Cryptosporidium NOT DETECTED NOT DETECTED Final   Cyclospora cayetanensis NOT DETECTED NOT DETECTED Final   Entamoeba histolytica NOT DETECTED NOT DETECTED Final   Giardia lamblia NOT DETECTED NOT DETECTED Final   Adenovirus F40/41 NOT DETECTED NOT DETECTED Final   Astrovirus NOT DETECTED NOT DETECTED Final   Norovirus GI/GII NOT DETECTED NOT DETECTED Final   Rotavirus A NOT DETECTED NOT DETECTED Final   Sapovirus (I, II, IV, and V) NOT DETECTED NOT DETECTED Final    Comment: Performed at [facility], [address], [place], [place] [address]     Time coordinating discharge: 45 minutes  SIGNED:   Tawni Millers, MD  [facility] 08/05/2019, 10:50 AM

## 2019-08-05 NOTE — Plan of Care (Addendum)
Instructions were reviewed with patient. All questions were answered. Patient was transported to main entrance by wheelchair. ° °

## 2019-08-05 NOTE — Progress Notes (Signed)
Upon discharging patient, she states she felt like she was seeing black "nat like" things against dark surfaces when she got up. I was instructed to give Augmentin an hour after giving it to see if she had a reaction of some sort to it. I gave the Augmentin at 1220. I went to assess/discharge patient at 1310.  I checked her BP and HR. 154/74 and 67 which is around her baseline. She stated that the her vision improved slightly while talking with me. She also stated she felt comfortable to go home safely. MD made aware.

## 2019-08-06 ENCOUNTER — Telehealth: Payer: Self-pay | Admitting: Internal Medicine

## 2019-08-06 ENCOUNTER — Encounter: Payer: Self-pay | Admitting: Internal Medicine

## 2019-08-06 NOTE — Progress Notes (Signed)
Patient called back to the hospital, last night she had hives that improved with benadryl.   At home she has about 6 more days of antibiotic therapy (metronidazole and ciprofloxacin). Clinically she is feeling better with no abdominal pain or diarrhea.  Advised to stop Augmentin and resume ciprofloxacin and metronidazole for the following 6 days. Follow up with Dr. Drema Dallas in 7 days.

## 2019-08-12 NOTE — Telephone Encounter (Signed)
Patient has scheduled a follow up for next available - October 18th.

## 2019-08-16 ENCOUNTER — Other Ambulatory Visit: Payer: Self-pay

## 2019-08-16 ENCOUNTER — Telehealth: Payer: Self-pay | Admitting: Internal Medicine

## 2019-08-16 DIAGNOSIS — E1165 Type 2 diabetes mellitus with hyperglycemia: Secondary | ICD-10-CM

## 2019-08-16 DIAGNOSIS — Z794 Long term (current) use of insulin: Secondary | ICD-10-CM

## 2019-08-16 MED ORDER — ACCU-CHEK MULTICLIX LANCETS MISC: 4 refills | Status: DC

## 2019-08-16 MED ORDER — ACCU-CHEK SMARTVIEW VI STRP: ORAL_STRIP | 4 refills | Status: DC

## 2019-08-16 NOTE — Telephone Encounter (Signed)
Rx sent to [facility] on [address]

## 2019-08-16 NOTE — Telephone Encounter (Signed)
Medication Refill Request  Did you call your pharmacy and request this refill first? Yes  . If patient has not contacted pharmacy first, instruct them to do so for future refills.  . Remind them that contacting the pharmacy for their refill is the quickest method to get the refill.  . Refill policy also stated that it will take anywhere between 24-72 hours to receive the refill.    Name of medication? Accu-Chek Multiclix Lancets    Accu-Chek Smartview Test Strips  Is this a 90 day supply? yes  Name and location of pharmacy? [facility] [address]  . Is the request for diabetes test strips? yes . If yes, what brand?  Accu-Chek

## 2019-08-27 DIAGNOSIS — E1165 Type 2 diabetes mellitus with hyperglycemia: Secondary | ICD-10-CM | POA: Diagnosis not present

## 2019-08-27 DIAGNOSIS — E876 Hypokalemia: Secondary | ICD-10-CM | POA: Diagnosis not present

## 2019-08-27 DIAGNOSIS — R7989 Other specified abnormal findings of blood chemistry: Secondary | ICD-10-CM | POA: Diagnosis not present

## 2019-08-27 DIAGNOSIS — K5792 Diverticulitis of intestine, part unspecified, without perforation or abscess without bleeding: Secondary | ICD-10-CM | POA: Diagnosis not present

## 2019-09-02 DIAGNOSIS — K625 Hemorrhage of anus and rectum: Secondary | ICD-10-CM | POA: Diagnosis not present

## 2019-09-02 DIAGNOSIS — Z8719 Personal history of other diseases of the digestive system: Secondary | ICD-10-CM | POA: Diagnosis not present

## 2019-10-15 DIAGNOSIS — K5792 Diverticulitis of intestine, part unspecified, without perforation or abscess without bleeding: Secondary | ICD-10-CM | POA: Diagnosis not present

## 2019-10-15 DIAGNOSIS — K625 Hemorrhage of anus and rectum: Secondary | ICD-10-CM | POA: Diagnosis not present

## 2019-10-18 DIAGNOSIS — Z1159 Encounter for screening for other viral diseases: Secondary | ICD-10-CM | POA: Diagnosis not present

## 2019-10-21 DIAGNOSIS — K5730 Diverticulosis of large intestine without perforation or abscess without bleeding: Secondary | ICD-10-CM | POA: Diagnosis not present

## 2019-10-21 DIAGNOSIS — D120 Benign neoplasm of cecum: Secondary | ICD-10-CM | POA: Diagnosis not present

## 2019-10-21 DIAGNOSIS — K625 Hemorrhage of anus and rectum: Secondary | ICD-10-CM | POA: Diagnosis not present

## 2019-10-21 DIAGNOSIS — Q438 Other specified congenital malformations of intestine: Secondary | ICD-10-CM | POA: Diagnosis not present

## 2019-10-21 DIAGNOSIS — D122 Benign neoplasm of ascending colon: Secondary | ICD-10-CM | POA: Diagnosis not present

## 2019-10-21 DIAGNOSIS — D123 Benign neoplasm of transverse colon: Secondary | ICD-10-CM | POA: Diagnosis not present

## 2019-10-21 LAB — HM COLONOSCOPY

## 2019-10-26 DIAGNOSIS — D120 Benign neoplasm of cecum: Secondary | ICD-10-CM | POA: Diagnosis not present

## 2019-10-26 DIAGNOSIS — D123 Benign neoplasm of transverse colon: Secondary | ICD-10-CM | POA: Diagnosis not present

## 2019-10-26 DIAGNOSIS — D122 Benign neoplasm of ascending colon: Secondary | ICD-10-CM | POA: Diagnosis not present

## 2019-11-16 DIAGNOSIS — L304 Erythema intertrigo: Secondary | ICD-10-CM | POA: Diagnosis not present

## 2019-11-22 ENCOUNTER — Encounter: Payer: Self-pay | Admitting: Internal Medicine

## 2019-11-22 ENCOUNTER — Ambulatory Visit (INDEPENDENT_AMBULATORY_CARE_PROVIDER_SITE_OTHER): Payer: Medicare Other | Admitting: Internal Medicine

## 2019-11-22 ENCOUNTER — Other Ambulatory Visit: Payer: Self-pay

## 2019-11-22 VITALS — BP 128/78 | HR 91 | Ht 65.0 in

## 2019-11-22 DIAGNOSIS — E785 Hyperlipidemia, unspecified: Secondary | ICD-10-CM

## 2019-11-22 DIAGNOSIS — Z789 Other specified health status: Secondary | ICD-10-CM | POA: Diagnosis not present

## 2019-11-22 DIAGNOSIS — Z794 Long term (current) use of insulin: Secondary | ICD-10-CM | POA: Diagnosis not present

## 2019-11-22 DIAGNOSIS — E1165 Type 2 diabetes mellitus with hyperglycemia: Secondary | ICD-10-CM

## 2019-11-22 NOTE — Progress Notes (Signed)
Patient ID: [name], female   DOB: [birth year], 69 y.o.   MRN: [id number]   This visit occurred during the SARS-CoV-2 public health emergency.  Safety protocols were in place, including screening questions prior to the visit, additional usage of staff PPE, and extensive cleaning of exam room while observing appropriate contact time as indicated for disinfecting solutions.   HPI: [name] is a 69 y.o.-year-old female, returning for f/u for DM2, dx in 2006-2007, insulin-dependent since 2017, uncontrolled, without long term complications.  She saw Dr. Buddy Duty in the past. Last visit with me was in 01/2019 and it was virtual.  She was admitted 08/02/2019 for diverticulitis, C. difficile colitis, and DKA.  She continues to have cervical spine steroid injections. She is planning on another one soon as she has severe neck pain.  Reviewed HbA1c levels: Lab Results  Component Value Date   HGBA1C 9.0 (H) 08/02/2019   HGBA1C 8.3 (A) 10/26/2018   HGBA1C 9.7 (A) 04/27/2018  12/09/2016: HbA1c 10.7% 07/10/2016: HbA1c 9.6%  Pt was on a regimen of: - NPH 25 units at bedtime - Actos 15 mg in a.m.  She is on: Insulin Before breakfast Before lunch Before dinner  Regular 20-25 >> 10  20-25 >> 10  NPH 20-25 >> 20  25  Please inject the insulin 30 min before meals.  She has tried:  Metformin >> stomach cramps, diarrhea  Invokana >> dry mouth, rash on face  Basaglar >> back muscle spasms, congestion She could not afford Trulicity. We stopped Actos 10/2017  Pt checks her sugars 1-2 times a day: - am:  103, 142-248, 262 >> 87-216, 239, 240 >> 113-143 >> 87-251, 308 (forgot NPH) - 2h after b'fast: 140-205, 244  >> 190 >> 184, 244 >> n/c - before lunch: n/c >> 61, 95 >> 235 >> 73, 99-166, 176 >> n/c >> 69-210 - 2h after lunch: n/c  - snack - before dinner: n/c >> 182 >> 125, 187 >> n/c >> 140-150s, 173 >> n/c - 2h after dinner: n/c >> 129, 189 >> n/c >> 248 - bedtime: n/c >> 95, 127-145 >>  n/c >> 253 >> n/c >> 238 - nighttime: n/c >> 66 >> n/c Lowest sugar was 61 >> 103 >> 73 >> 113 >> 69; it is unclear at which level she has hypoglycemia awareness Highest sugar was  473 (steroids) >> 200s >> 173 >> 308.  Glucometer: Accu-Chek  Pt's meals are: - Breakfast: oatmeal, Kashi cereal, English muffin, whole wheat bread - Lunch: sandwich, salad - Dinner: (late, 8 pm): veggies, meat or pasta or eggs + toast (largest) - Snacks: none + Coke 0, tea + Splenda  -No CKD last BUN/creatinine:   Lab Results  Component Value Date   BUN 20 08/05/2019   BUN 13 08/04/2019   CREATININE 0.75 08/05/2019   CREATININE 0.62 08/04/2019  07/10/2016: ACR 5.5  -+ HL last set of lipids: 06/01/2019: 273/165/52/151 01/15/2019:206/221/50/118 01/06/2018: 204/115/53/129 12/10/2016: 226/113/57/147. No results found for: CHOL, HDL, LDLCALC, LDLDIRECT, TRIG, CHOLHDL  She is not on a statin.  Lovastatin, rosuvastatin, and atorvastatin caused muscle aches.  - last eye exam was 03/2018: No DR  -No numbness and tingling in her feet She is on Neurontin 100 mg 3 times a day but for trigeminal neuralgia.  On ASA 81.  Unclear FH of DM -patient is adopted.  She also has a history of HTN, fibromyalgia, vitamin D deficiency, PCOS, fatty liver, kidney stones, trigeminal neuralgia, migraines.  ROS: Constitutional:  no weight gain/no weight loss, no fatigue, no subjective hyperthermia, no subjective hypothermia Eyes: no blurry vision, no xerophthalmia ENT: no sore throat, no nodules palpated in neck, no dysphagia, no odynophagia, no hoarseness Cardiovascular: no CP/no SOB/no palpitations/no leg swelling Respiratory: no cough/no SOB/no wheezing Gastrointestinal: no N/no V/no D/no C/no acid reflux Musculoskeletal: no muscle aches/+ joint aches Skin: no rashes, no hair loss Neurological: no tremors/no numbness/no tingling/no dizziness  I reviewed pt's medications, allergies, PMH, social hx, family hx, and  changes were documented in the history of present illness. Otherwise, unchanged from my initial visit note.  Past Medical History:  Diagnosis Date  . Diabetes mellitus   . Diverticulitis   . Fibromyalgia   . Hypercholesteremia   . Hypertension   . IBS (irritable bowel syndrome)   . Migraines   . Nasal fracture   . Stein-Leventhal ovaries    Past Surgical History:  Procedure Laterality Date  . CHOLECYSTECTOMY  1998  . KNEE SURGERY Right 2017  . LEFT HEART CATHETERIZATION WITH CORONARY ANGIOGRAM N/A 12/19/2011   Procedure: LEFT HEART CATHETERIZATION WITH CORONARY ANGIOGRAM;  Surgeon: Candee Furbish, MD;  Location: [facility] CATH LAB;  Service: Cardiovascular;  Laterality: N/A;  . SHOULDER SURGERY    . TONSILLECTOMY  1959   Social History   Socioeconomic History  . Marital status: Single    Spouse name: Not on file  . Number of children: 0  . Years of education: Some college  . Highest education level: Not on file  Occupational History  . Occupation: accounting    Comment: [employer]  Tobacco Use  . Smoking status: Never Smoker  . Smokeless tobacco: Never Used  Vaping Use  . Vaping Use: Never used  Substance and Sexual Activity  . Alcohol use: Yes    Comment: rare  . Drug use: No  . Sexual activity: Not on file  Other Topics Concern  . Not on file  Social History Narrative   Lives alone w/ her 3 cats   Right-handed   Caffeine: 2 cups coffee/tea per day   Social Determinants of Health   Financial Resource Strain:   . Difficulty of Paying Living Expenses: Not on file  Food Insecurity:   . Worried About Charity fundraiser in the Last Year: Not on file  . Ran Out of Food in the Last Year: Not on file  Transportation Needs:   . Lack of Transportation (Medical): Not on file  . Lack of Transportation (Non-Medical): Not on file  Physical Activity:   . Days of Exercise per Week: Not on file  . Minutes of Exercise per Session: Not on file  Stress:   . Feeling of  Stress : Not on file  Social Connections:   . Frequency of Communication with Friends and Family: Not on file  . Frequency of Social Gatherings with Friends and Family: Not on file  . Attends Religious Services: Not on file  . Active Member of Clubs or Organizations: Not on file  . Attends Archivist Meetings: Not on file  . Marital Status: Not on file  Intimate Partner Violence:   . Fear of Current or Ex-Partner: Not on file  . Emotionally Abused: Not on file  . Physically Abused: Not on file  . Sexually Abused: Not on file   Current Outpatient Medications on File Prior to Visit  Medication Sig Dispense Refill  . ACCU-CHEK SMARTVIEW test strip USE AS INSTRUCTED TO TEST TWICE DAILY 100 each 4  .  acetaminophen (TYLENOL) 650 MG CR tablet Take 650 mg by mouth every 8 (eight) hours as needed for pain.    Marland Kitchen acetaminophen-codeine (TYLENOL #3) 300-30 MG per tablet Take 1 tablet by mouth 2 (two) times daily as needed for severe pain.     . cholecalciferol (VITAMIN D) 1000 UNITS tablet Take 1,000 Units by mouth every evening.     . fluticasone (FLONASE) 50 MCG/ACT nasal spray Place 1 spray into both nostrils daily.    . hydrochlorothiazide (HYDRODIURIL) 25 MG tablet Take 12.5 mg by mouth daily.    Marland Kitchen HYDROcodone-acetaminophen (NORCO) 7.5-325 MG tablet Take 1 tablet by mouth every 6 (six) hours as needed (Migraine headaches).     Marland Kitchen ibuprofen (ADVIL) 200 MG tablet Take 200 mg by mouth every 8 (eight) hours as needed for mild pain.    Marland Kitchen insulin NPH Human (NOVOLIN N) 100 UNIT/ML injection Inject 18 units in am and 25 units in pm (Patient taking differently: Inject 20 Units into the skin 2 (two) times daily before a meal. ) 10 mL 11  . insulin regular (NOVOLIN R RELION) 100 units/mL injection Inject 0.08-0.1 mLs (8-10 Units total) into the skin 2 (two) times daily before a meal. (Patient taking differently: Inject 20 Units into the skin 2 (two) times daily before a meal. ) 10 mL 11  . Lancets  (ACCU-CHEK MULTICLIX) lancets Use as instructed to test once daily DX E11.65 100 each 4  . metoprolol (LOPRESSOR) 50 MG tablet Take 25 mg by mouth 2 (two) times daily.  0  . ondansetron (ZOFRAN) 4 MG tablet Take 1 tablet (4 mg total) by mouth every 6 (six) hours as needed for nausea. 20 tablet 0  . polyvinyl alcohol (LIQUIFILM TEARS) 1.4 % ophthalmic solution Place 1 drop into both eyes every 6 (six) hours as needed for dry eyes.    Marland Kitchen PRESCRIPTION MEDICATION Patient receives a Steroid injection in the neck once a year with Dr. Mina Marble at [facility] pt is unable to recall the exact name and i have been unable to verify name from the Dr's office. Patient also states " it shoots up my blood sugars really high, but i still get it because i can't take anything else for my neck pain"    . tiZANidine (ZANAFLEX) 4 MG tablet Take 4 mg by mouth at bedtime as needed for muscle spasms.     . [DISCONTINUED] gabapentin (NEURONTIN) 100 MG capsule Take 1 capsule (100 mg total) by mouth 3 (three) times daily. (Patient not taking: Reported on 07/06/2019) 90 capsule 12   Current Facility-Administered Medications on File Prior to Visit  Medication Dose Route Frequency Provider Last Rate Last Admin  . methylPREDNISolone sodium succinate (SOLU-MEDROL) 130 mg in sodium chloride 0.9 % 50 mL IVPB  130 mg Intravenous Once Jerline Pain, MD       Allergies  Allergen Reactions  . Contrast Media [Iodinated Diagnostic Agents] Hives  . Erythromycin Anaphylaxis  . Rocephin [Ceftriaxone] Hives and Itching  . Statins Other (See Comments)    Severe muscle pain/weakness  . Augmentin [Amoxicillin-Pot Clavulanate] Nausea And Vomiting    Nausea, vomiting, headache, dizziness. Hives on 08/2019  . Basaglar Claiborne Rigg [Insulin Glargine]     Severe ankle swelling  . Metformin And Related Diarrhea and Nausea And Vomiting    Violently ill for 2 yrs  . Nsaids Other (See Comments)    Stomach problems  . Prednisone Other (See  Comments)    Abnormal blood sugars.Marland Kitchen  Marland Kitchen  Scallops [Shellfish Allergy] Diarrhea and Nausea And Vomiting    Severe GI upset  . Sulfa Antibiotics Other (See Comments)    blackout  . Invokana [Canagliflozin] Rash and Other (See Comments)    Dehydration  . Tramadol Rash   Family History  Adopted: Yes  Problem Relation Age of Onset  . Failure to thrive Mother   . Heart failure Father   . Neuropathy Neg Hx    PE: BP 128/78   Pulse 91   Ht 5\' 5"  (1.651 m)   SpO2 97%   BMI 31.95 kg/m  Refused being weighed.  Wt Readings from Last 3 Encounters:  08/02/19 192 lb 0.3 oz (87.1 kg)  07/06/19 192 lb (87.1 kg)  10/26/18 189 lb (85.7 kg)   Constitutional: overweight, in NAD Eyes: PERRLA, EOMI, no exophthalmos ENT: moist mucous membranes, no thyromegaly, no cervical lymphadenopathy Cardiovascular: RRR, No MRG Respiratory: CTA B Gastrointestinal: abdomen soft, NT, ND, BS+ Musculoskeletal: no deformities, strength intact in all 4 Skin: moist, warm, no rashes Neurological: no tremor with outstretched hands, DTR normal in all 4  ASSESSMENT: 1. DM2, insulin-dependent, uncontrolled, without long-term complications, but with hyperglycemia  + hypertensive retinopathy  2. HL  PLAN:  1. Patient with longstanding, uncontrolled, type 2 diabetes, on basal-bolus insulin regimen with NPH and regular insulin due to price.  She returns after 10 months.  She could not tolerate Basaglar in the past and could not afford the GLP-1 receptor agonist.  In 2019, we stopped Actos due to improved blood sugars and also significant weight gain and lower extremity edema.  She did lose weight and her edema decreased afterwards.  Her diabetes control is limited by her erratic schedule and forgetting insulin doses.  Sugars improved at last visit despite using lower sugars of regular insulin and not injecting insulin with lunch.  Her sugars in the morning were much better, almost all at target, but they were higher before  lunch.  She had also high blood sugars before dinner possibly because of a snack in the afternoon.  We discussed about continuing with the same doses but to use doses at the higher end of the interval for regular insulin.  HbA1c at last visit was 8.3%.  However, 4 months ago, she had another HbA1c and this was higher, at 9%. - at today's visit, sugars are higher as she still has an erratic schedule and forgets insulin doses. Moreover, her R insulin doses were decreased in the hospital in 07/2019. At today's visit, we discussed about the importance of taking the R insulin before every meal (will also try to group meals into just 3) and to take it 30 minutes before the meal.  We will also go ahead and increase her regular insulin doses and add another dose before lunch if she ate lunch. -We did discuss about organizing her day - she is having problems with this since she works longer hours.  She has tried to work with counselors before but this did not work well for her.  She has discussed with her manager about reducing her work hours.  I think this would help. - I suggested to:  Patient Instructions   Please increase: Insulin Before breakfast Before lunch Before dinner  Regular 15-20 (8-10) 15-20  NPH 20  25  Please inject the insulin 30 min before meals.  Please return in 4 months with your sugar log.   - at today's visit, she refused a HbA1c - advised to check sugars at  different times of the day - 3x a day, rotating check times - advised for yearly eye exams >> she is not UTD - return to clinic in 4 months   2. HL -Reviewed latest lipid panel from 01/2018: LDL high, but improved -She continues off statins due to previous side effects with several of them -She had another lipid panel with PCP this summer-we will need records   Philemon Kingdom, MD PhD [facility]

## 2019-11-22 NOTE — Patient Instructions (Addendum)
Please increase: Insulin Before breakfast Before lunch Before dinner  Regular 15-20 (8-10) 15-20  NPH 20  25  Please inject the insulin 30 min before meals.  Please return in 4 months with your sugar log.

## 2019-12-08 DIAGNOSIS — M5412 Radiculopathy, cervical region: Secondary | ICD-10-CM | POA: Diagnosis not present

## 2019-12-19 DIAGNOSIS — Z23 Encounter for immunization: Secondary | ICD-10-CM | POA: Diagnosis not present

## 2020-01-25 DIAGNOSIS — R059 Cough, unspecified: Secondary | ICD-10-CM | POA: Diagnosis not present

## 2020-01-25 DIAGNOSIS — I10 Essential (primary) hypertension: Secondary | ICD-10-CM | POA: Diagnosis not present

## 2020-01-25 DIAGNOSIS — E1165 Type 2 diabetes mellitus with hyperglycemia: Secondary | ICD-10-CM | POA: Diagnosis not present

## 2020-01-25 DIAGNOSIS — R0981 Nasal congestion: Secondary | ICD-10-CM | POA: Diagnosis not present

## 2020-01-25 DIAGNOSIS — Z03818 Encounter for observation for suspected exposure to other biological agents ruled out: Secondary | ICD-10-CM | POA: Diagnosis not present

## 2020-01-25 DIAGNOSIS — E669 Obesity, unspecified: Secondary | ICD-10-CM | POA: Diagnosis not present

## 2020-02-01 ENCOUNTER — Other Ambulatory Visit: Payer: Self-pay | Admitting: Orthopedic Surgery

## 2020-02-01 DIAGNOSIS — M545 Low back pain, unspecified: Secondary | ICD-10-CM | POA: Diagnosis not present

## 2020-02-01 DIAGNOSIS — M5416 Radiculopathy, lumbar region: Secondary | ICD-10-CM | POA: Diagnosis not present

## 2020-02-23 ENCOUNTER — Other Ambulatory Visit: Payer: Medicare Other

## 2020-03-28 ENCOUNTER — Ambulatory Visit: Payer: Medicare Other | Admitting: Internal Medicine

## 2020-04-28 DIAGNOSIS — H524 Presbyopia: Secondary | ICD-10-CM | POA: Diagnosis not present

## 2020-04-28 DIAGNOSIS — H52223 Regular astigmatism, bilateral: Secondary | ICD-10-CM | POA: Diagnosis not present

## 2020-04-28 DIAGNOSIS — H2513 Age-related nuclear cataract, bilateral: Secondary | ICD-10-CM | POA: Diagnosis not present

## 2020-04-28 DIAGNOSIS — H04123 Dry eye syndrome of bilateral lacrimal glands: Secondary | ICD-10-CM | POA: Diagnosis not present

## 2020-04-28 DIAGNOSIS — E119 Type 2 diabetes mellitus without complications: Secondary | ICD-10-CM | POA: Diagnosis not present

## 2020-04-28 DIAGNOSIS — H5213 Myopia, bilateral: Secondary | ICD-10-CM | POA: Diagnosis not present

## 2020-04-28 LAB — HM DIABETES EYE EXAM

## 2020-05-01 ENCOUNTER — Ambulatory Visit (INDEPENDENT_AMBULATORY_CARE_PROVIDER_SITE_OTHER): Payer: Medicare Other | Admitting: Internal Medicine

## 2020-05-01 ENCOUNTER — Encounter: Payer: Self-pay | Admitting: Internal Medicine

## 2020-05-01 ENCOUNTER — Other Ambulatory Visit: Payer: Self-pay

## 2020-05-01 VITALS — BP 138/88 | HR 76 | Ht 65.0 in

## 2020-05-01 DIAGNOSIS — E785 Hyperlipidemia, unspecified: Secondary | ICD-10-CM | POA: Diagnosis not present

## 2020-05-01 DIAGNOSIS — E1165 Type 2 diabetes mellitus with hyperglycemia: Secondary | ICD-10-CM | POA: Diagnosis not present

## 2020-05-01 DIAGNOSIS — Z794 Long term (current) use of insulin: Secondary | ICD-10-CM

## 2020-05-01 LAB — POCT GLYCOSYLATED HEMOGLOBIN (HGB A1C): Hemoglobin A1C: 10 % — AB (ref 4.0–5.6)

## 2020-05-01 NOTE — Progress Notes (Signed)
Patient ID: [name], female   DOB: July 22, 1950, 70 y.o.   MRN: [id number]   This visit occurred during the SARS-CoV-2 public health emergency.  Safety protocols were in place, including screening questions prior to the visit, additional usage of staff PPE, and extensive cleaning of exam room while observing appropriate contact time as indicated for disinfecting solutions.   HPI: [name] is a 70 y.o.-year-old female, returning for f/u for DM2, dx in 2006-2007, insulin-dependent since 2017, uncontrolled, without long term complications.  She saw Dr. Buddy Duty in the past.  Last visit with me 6 months ago.  Before last visit, was admitted 08/02/2019 for diverticulitis, C. difficile colitis, and DKA.  Interim history: She continues to have cervical spine steroid injections. Last inj. In 12/2019.  She also has lower spine pain >> will have an MRI, may also need surgery. She cannot exercise. She had a viral infection >> took 2 months to recovered. She is still very busy at work. She has late dinners.  Reviewed HbA1c levels: Lab Results  Component Value Date   HGBA1C 9.0 (H) 08/02/2019   HGBA1C 8.3 (A) 10/26/2018   HGBA1C 9.7 (A) 04/27/2018  12/09/2016: HbA1c 10.7% 07/10/2016: HbA1c 9.6%  Pt was on a regimen of: - NPH 25 units at bedtime - Actos 15 mg in a.m.  She is now on: Insulin Before breakfast Before lunch Before dinner  Novolin Regular 20  15-20 >> 20  Novolin NPH 20  25 >> 20    She has tried:  Metformin >> stomach cramps, diarrhea  Invokana >> dry mouth, rash on face  Basaglar >> back muscle spasms, congestion She could not afford Trulicity. We stopped Actos 10/2017  Pt checks her sugars 1-2 times a day: - am:   87-216, 239, 240 >> 113-143 >> 87-251, 308 >> 149-275, 292 - 2h after b'fast: 140-205, 244  >> 190 >> 184, 244 >> n/c - before lunch:235 >> 73, 99-166, 176 >> n/c >> 69-210 >> n/c - 2h after lunch: n/c  - snack - before dinner: 125, 187 >> n/c >>  140-150s, 173 >> n/c >> 113, 180-317, 346 - 2h after dinner: n/c >> 129, 189 >> n/c >> 248  - bedtime:  95, 127-145 >> n/c >> 253 >> n/c >> 238 - nighttime: n/c >> 66 >> n/c Lowest sugar was  113 >> 69 >> 113; it is unclear at which level she has hypoglycemia awareness Highest sugar was  173 >> 308.>> 346  Glucometer: Accu-Chek  Pt's meals are: - Breakfast: oatmeal, Kashi cereal, English muffin, whole wheat bread - Lunch: sandwich, salad - Dinner: (late, 8 pm): veggies, meat or pasta or eggs + toast (largest) - Snacks: none + Coke 0, tea + Splenda  -No CKD last BUN/creatinine:  Lab Results  Component Value Date   BUN 20 08/05/2019   BUN 13 08/04/2019   CREATININE 0.75 08/05/2019   CREATININE 0.62 08/04/2019  07/10/2016: ACR 5.5  -+ HL last set of lipids: 06/01/2019: 273/165/52/151 01/15/2019:206/221/50/118 01/06/2018: 204/115/53/129 12/10/2016: 226/113/57/147. No results found for: CHOL, HDL, LDLCALC, LDLDIRECT, TRIG, CHOLHDL  She is not on a statin.  Lovastatin, rosuvastatin, and atorvastatin caused muscle aches.  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 [name] - Dr. Jerline Pain.  -No numbness and tingling in her feet She is on Neurontin 100 mg 3 times a day but for trigeminal neuralgia.  On ASA 81.  Unclear FH of DM -patient is adopted.  She also  has a history of HTN, fibromyalgia, vitamin D deficiency, PCOS, fatty liver, kidney stones, trigeminal neuralgia, migraines.  ROS: Constitutional: no weight gain/no weight loss, no fatigue, no subjective hyperthermia, no subjective hypothermia Eyes: no blurry vision, no xerophthalmia ENT: no sore throat, no nodules palpated in neck, no dysphagia, no odynophagia, no hoarseness Cardiovascular: no CP/no SOB/no palpitations/no leg swelling Respiratory: no cough/no SOB/no wheezing Gastrointestinal: no N/no V/no D/no C/no acid reflux Musculoskeletal: no muscle aches/+ joint aches Skin: no rashes, no hair  loss Neurological: no tremors/no numbness/no tingling/no dizziness  I reviewed pt's medications, allergies, PMH, social hx, family hx, and changes were documented in the history of present illness. Otherwise, unchanged from my initial visit note.  Past Medical History:  Diagnosis Date  . Diabetes mellitus   . Diverticulitis   . Fibromyalgia   . Hypercholesteremia   . Hypertension   . IBS (irritable bowel syndrome)   . Migraines   . Nasal fracture   . Stein-Leventhal ovaries    Past Surgical History:  Procedure Laterality Date  . CHOLECYSTECTOMY  1998  . KNEE SURGERY Right 2017  . LEFT HEART CATHETERIZATION WITH CORONARY ANGIOGRAM N/A 12/19/2011   Procedure: LEFT HEART CATHETERIZATION WITH CORONARY ANGIOGRAM;  Surgeon: Candee Furbish, MD;  Location: [facility] CATH LAB;  Service: Cardiovascular;  Laterality: N/A;  . SHOULDER SURGERY    . TONSILLECTOMY  1959   Social History   Socioeconomic History  . Marital status: Single    Spouse name: Not on file  . Number of children: 0  . Years of education: Some college  . Highest education level: Not on file  Occupational History  . Occupation: accounting    Comment: [employer]  Tobacco Use  . Smoking status: Never Smoker  . Smokeless tobacco: Never Used  Vaping Use  . Vaping Use: Never used  Substance and Sexual Activity  . Alcohol use: Yes    Comment: rare  . Drug use: No  . Sexual activity: Not on file  Other Topics Concern  . Not on file  Social History Narrative   Lives alone w/ her 3 cats   Right-handed   Caffeine: 2 cups coffee/tea per day   Social Determinants of Health   Financial Resource Strain: Not on file  Food Insecurity: Not on file  Transportation Needs: Not on file  Physical Activity: Not on file  Stress: Not on file  Social Connections: Not on file  Intimate Partner Violence: Not on file   Current Outpatient Medications on File Prior to Visit  Medication Sig Dispense Refill  . ACCU-CHEK SMARTVIEW  test strip USE AS INSTRUCTED TO TEST TWICE DAILY 100 each 4  . acetaminophen (TYLENOL) 650 MG CR tablet Take 650 mg by mouth every 8 (eight) hours as needed for pain.    Marland Kitchen acetaminophen-codeine (TYLENOL #3) 300-30 MG per tablet Take 1 tablet by mouth 2 (two) times daily as needed for severe pain.     . cholecalciferol (VITAMIN D) 1000 UNITS tablet Take 1,000 Units by mouth every evening.     . fluticasone (FLONASE) 50 MCG/ACT nasal spray Place 1 spray into both nostrils daily.    . hydrochlorothiazide (HYDRODIURIL) 25 MG tablet Take 12.5 mg by mouth daily.    Marland Kitchen HYDROcodone-acetaminophen (NORCO) 7.5-325 MG tablet Take 1 tablet by mouth every 6 (six) hours as needed (Migraine headaches).     Marland Kitchen ibuprofen (ADVIL) 200 MG tablet Take 200 mg by mouth every 8 (eight) hours as needed for mild pain.    Marland Kitchen  insulin NPH Human (NOVOLIN N) 100 UNIT/ML injection Inject 18 units in am and 25 units in pm (Patient taking differently: Inject 20 Units into the skin 2 (two) times daily before a meal. ) 10 mL 11  . insulin regular (NOVOLIN R RELION) 100 units/mL injection Inject 0.08-0.1 mLs (8-10 Units total) into the skin 2 (two) times daily before a meal. (Patient taking differently: Inject 20 Units into the skin 2 (two) times daily before a meal. ) 10 mL 11  . Lancets (ACCU-CHEK MULTICLIX) lancets Use as instructed to test once daily DX E11.65 100 each 4  . metoprolol (LOPRESSOR) 50 MG tablet Take 25 mg by mouth 2 (two) times daily.  0  . ondansetron (ZOFRAN) 4 MG tablet Take 1 tablet (4 mg total) by mouth every 6 (six) hours as needed for nausea. 20 tablet 0  . polyvinyl alcohol (LIQUIFILM TEARS) 1.4 % ophthalmic solution Place 1 drop into both eyes every 6 (six) hours as needed for dry eyes.    Marland Kitchen PRESCRIPTION MEDICATION Patient receives a Steroid injection in the neck once a year with Dr. Mina Marble at [facility] pt is unable to recall the exact name and i have been unable to verify name from the Dr's office.  Patient also states " it shoots up my blood sugars really high, but i still get it because i can't take anything else for my neck pain"    . tiZANidine (ZANAFLEX) 4 MG tablet Take 4 mg by mouth at bedtime as needed for muscle spasms.     . [DISCONTINUED] gabapentin (NEURONTIN) 100 MG capsule Take 1 capsule (100 mg total) by mouth 3 (three) times daily. (Patient not taking: Reported on 07/06/2019) 90 capsule 12   Current Facility-Administered Medications on File Prior to Visit  Medication Dose Route Frequency Provider Last Rate Last Admin  . methylPREDNISolone sodium succinate (SOLU-MEDROL) 130 mg in sodium chloride 0.9 % 50 mL IVPB  130 mg Intravenous Once Jerline Pain, MD       Allergies  Allergen Reactions  . Contrast Media [Iodinated Diagnostic Agents] Hives  . Erythromycin Anaphylaxis  . Rocephin [Ceftriaxone] Hives and Itching  . Statins Other (See Comments)    Severe muscle pain/weakness  . Augmentin [Amoxicillin-Pot Clavulanate] Nausea And Vomiting    Nausea, vomiting, headache, dizziness. Hives on 08/2019  . Basaglar [place] Rigg [Insulin Glargine]     Severe ankle swelling  . Metformin And Related Diarrhea and Nausea And Vomiting    Violently ill for 2 yrs  . Nsaids Other (See Comments)    Stomach problems  . Prednisone Other (See Comments)    Abnormal blood sugars..  . Scallops [Shellfish Allergy] Diarrhea and Nausea And Vomiting    Severe GI upset  . Sulfa Antibiotics Other (See Comments)    blackout  . Invokana [Canagliflozin] Rash and Other (See Comments)    Dehydration  . Tramadol Rash   Family History  Adopted: Yes  Problem Relation Age of Onset  . Failure to thrive Mother   . Heart failure Father   . Neuropathy Neg Hx    PE: BP 138/88 (BP Location: Left Arm, Patient Position: Sitting, Cuff Size: Normal)   Pulse 76   Ht 5\' 5"  (1.651 m)   SpO2 97%   BMI 31.95 kg/m  Refused being weighed.  Wt Readings from Last 3 Encounters:  08/02/19 192 lb 0.3 oz (87.1 kg)   07/06/19 192 lb (87.1 kg)  10/26/18 189 lb (85.7 kg)   Constitutional: overweight,  in NAD Eyes: PERRLA, EOMI, no exophthalmos ENT: moist mucous membranes, no thyromegaly, no cervical lymphadenopathy Cardiovascular: RRR, No MRG Respiratory: CTA B Gastrointestinal: abdomen soft, NT, ND, BS+ Musculoskeletal: no deformities, strength intact in all 4 Skin: moist, warm, no rashes Neurological: no tremor with outstretched hands, DTR normal in all 4  ASSESSMENT: 1. DM2, insulin-dependent, uncontrolled, without long-term complications, but with hyperglycemia  + hypertensive retinopathy  2. HL  PLAN:  1. Patient with longstanding, uncontrolled, type 2 diabetes, on basal-bolus insulin regimen with NPH and regular insulin due to price.  She could not tolerate Basaglar in the past and could not afford a GLP-1 receptor agonist.  In 2019 we stopped Actos due to improved blood sugars and also significant weight gain and lower extremity edema.  She did lose weight afterwards and her edema decreased.  Her diabetes control is limited by her erratic schedule and forgetting insulin doses.  At last visit, sugars are higher due to the above but also with her regular insulin doses were decreased in the hospital in 07/2019.  We discussed about the importance of taking the regular insulin before every meal, 30 minutes before the meals.  We increased her doses at that time and added a dose before lunch if she did not skip meals.  We also discussed about the importance of organizing her day.  She tried to work with counselors before but this did not work well for her.  She previously discussed with her manager about reducing her work hours. - At last visit, she refused an HbA1c check.  - At today's visit, sugars are very high, with only rare sugars at close to goal.  Upon questioning, she is in constant pain (cervical and lumbar spine).  She is getting steroid injections in the cervical spine, which can also increase  her blood sugars.  She may need lower back surgery.  For these reasons, she cannot exercise.  Also, she lays down when she comes home from work and sometimes falls asleep and eats dinner late, around 9 PM.  This is also contributing to her high blood sugars in the morning. -At this visit, since sugars are high at all times of the day but especially before dinner, I advised her to introduce regular insulin before lunch.  I did advise her about this at last visit, but she did not do so yet.  Also, we need to increase her NPH and regular insulin in the morning and also regular insulin before dinner. - I suggested to:  Patient Instructions   Please continue: Insulin Before breakfast Before lunch Before dinner  Regular 20 >> 20-25 8-10 20 >> 20-25  NPH 20 >> 25  25  Please inject the insulin 30 min before meals.  Please return in 3-4 months with your sugar log.   - we checked her HbA1c: 10.0% (higher) - advised to check sugars at different times of the day - 4x a day, rotating check times - advised for yearly eye exams >> she is UTD - return to clinic in 4 months   2. HL -Reviewed latest lipid panel from 05/2019: 233/165/52/151: LDL still higher than target, triglycerides slightly high -She is intolerant to statins: Previous side effects with several of them  Philemon Kingdom, MD PhD [facility]

## 2020-05-01 NOTE — Patient Instructions (Addendum)
Please continue: Insulin Before breakfast Before lunch Before dinner  Regular 20 >> 20-25 8-10 20 >> 20-25  NPH 20 >> 25  25  Please inject the insulin 30 min before meals.  Please return in 3-4 months with your sugar log.

## 2020-05-07 ENCOUNTER — Ambulatory Visit
Admission: RE | Admit: 2020-05-07 | Discharge: 2020-05-07 | Disposition: A | Discharge status: Home / Self Care | Payer: Medicare Other | Source: Ambulatory Visit | Attending: Orthopedic Surgery | Admitting: Orthopedic Surgery

## 2020-05-07 ENCOUNTER — Other Ambulatory Visit: Payer: Self-pay

## 2020-05-07 DIAGNOSIS — M545 Low back pain, unspecified: Secondary | ICD-10-CM | POA: Diagnosis not present

## 2020-05-07 DIAGNOSIS — M48061 Spinal stenosis, lumbar region without neurogenic claudication: Secondary | ICD-10-CM | POA: Diagnosis not present

## 2020-05-08 DIAGNOSIS — M545 Low back pain, unspecified: Secondary | ICD-10-CM | POA: Diagnosis not present

## 2020-05-11 DIAGNOSIS — M5412 Radiculopathy, cervical region: Secondary | ICD-10-CM | POA: Diagnosis not present

## 2020-05-11 DIAGNOSIS — M47816 Spondylosis without myelopathy or radiculopathy, lumbar region: Secondary | ICD-10-CM | POA: Diagnosis not present

## 2020-05-12 DIAGNOSIS — G43909 Migraine, unspecified, not intractable, without status migrainosus: Secondary | ICD-10-CM | POA: Diagnosis not present

## 2020-05-12 DIAGNOSIS — G8929 Other chronic pain: Secondary | ICD-10-CM | POA: Diagnosis not present

## 2020-05-12 DIAGNOSIS — M797 Fibromyalgia: Secondary | ICD-10-CM | POA: Diagnosis not present

## 2020-05-12 DIAGNOSIS — E1165 Type 2 diabetes mellitus with hyperglycemia: Secondary | ICD-10-CM | POA: Diagnosis not present

## 2020-05-12 DIAGNOSIS — I10 Essential (primary) hypertension: Secondary | ICD-10-CM | POA: Diagnosis not present

## 2020-05-16 DIAGNOSIS — M47816 Spondylosis without myelopathy or radiculopathy, lumbar region: Secondary | ICD-10-CM | POA: Diagnosis not present

## 2020-06-06 DIAGNOSIS — M47816 Spondylosis without myelopathy or radiculopathy, lumbar region: Secondary | ICD-10-CM | POA: Diagnosis not present

## 2020-08-09 ENCOUNTER — Emergency Department (HOSPITAL_COMMUNITY)
Admission: EM | Admit: 2020-08-09 | Discharge: 2020-08-09 | Disposition: A | Discharge status: Home / Self Care | Payer: Medicare Other | Attending: Emergency Medicine | Admitting: Emergency Medicine

## 2020-08-09 ENCOUNTER — Encounter (HOSPITAL_COMMUNITY): Payer: Self-pay | Admitting: Emergency Medicine

## 2020-08-09 ENCOUNTER — Emergency Department (HOSPITAL_COMMUNITY): Payer: Medicare Other

## 2020-08-09 DIAGNOSIS — Z794 Long term (current) use of insulin: Secondary | ICD-10-CM | POA: Diagnosis not present

## 2020-08-09 DIAGNOSIS — K5792 Diverticulitis of intestine, part unspecified, without perforation or abscess without bleeding: Secondary | ICD-10-CM | POA: Diagnosis not present

## 2020-08-09 DIAGNOSIS — I10 Essential (primary) hypertension: Secondary | ICD-10-CM | POA: Diagnosis not present

## 2020-08-09 DIAGNOSIS — E119 Type 2 diabetes mellitus without complications: Secondary | ICD-10-CM | POA: Insufficient documentation

## 2020-08-09 DIAGNOSIS — Z79899 Other long term (current) drug therapy: Secondary | ICD-10-CM | POA: Diagnosis not present

## 2020-08-09 DIAGNOSIS — R109 Unspecified abdominal pain: Secondary | ICD-10-CM | POA: Diagnosis present

## 2020-08-09 LAB — COMPREHENSIVE METABOLIC PANEL
ALT: 16 U/L (ref 0–44)
AST: 19 U/L (ref 15–41)
Albumin: 4.1 g/dL (ref 3.5–5.0)
Alkaline Phosphatase: 111 U/L (ref 38–126)
Anion gap: 14 (ref 5–15)
BUN: 28 mg/dL — ABNORMAL HIGH (ref 8–23)
CO2: 25 mmol/L (ref 22–32)
Calcium: 9.6 mg/dL (ref 8.9–10.3)
Chloride: 98 mmol/L (ref 98–111)
Creatinine, Ser: 1.06 mg/dL — ABNORMAL HIGH (ref 0.44–1.00)
GFR, Estimated: 56 mL/min — ABNORMAL LOW (ref >60)
Glucose, Bld: 207 mg/dL — ABNORMAL HIGH (ref 70–99)
Potassium: 3.3 mmol/L — ABNORMAL LOW (ref 3.5–5.1)
Sodium: 137 mmol/L (ref 135–145)
Total Bilirubin: 1.2 mg/dL (ref 0.3–1.2)
Total Protein: 7.5 g/dL (ref 6.5–8.1)

## 2020-08-09 LAB — CBC WITH DIFFERENTIAL/PLATELET
Abs Immature Granulocytes: 0.07 10*3/uL (ref 0.00–0.07)
Basophils Absolute: 0.1 10*3/uL (ref 0.0–0.1)
Basophils Relative: 1 %
Eosinophils Absolute: 0.1 10*3/uL (ref 0.0–0.5)
Eosinophils Relative: 0 %
HCT: 42.6 % (ref 36.0–46.0)
Hemoglobin: 14.8 g/dL (ref 12.0–15.0)
Immature Granulocytes: 0 %
Lymphocytes Relative: 11 %
Lymphs Abs: 1.9 10*3/uL (ref 0.7–4.0)
MCH: 30.9 pg (ref 26.0–34.0)
MCHC: 34.7 g/dL (ref 30.0–36.0)
MCV: 88.9 fL (ref 80.0–100.0)
Monocytes Absolute: 1.3 10*3/uL — ABNORMAL HIGH (ref 0.1–1.0)
Monocytes Relative: 7 %
Neutro Abs: 14.1 10*3/uL — ABNORMAL HIGH (ref 1.7–7.7)
Neutrophils Relative %: 81 %
Platelets: 258 10*3/uL (ref 150–400)
RBC: 4.79 MIL/uL (ref 3.87–5.11)
RDW: 12.8 % (ref 11.5–15.5)
WBC: 17.5 10*3/uL — ABNORMAL HIGH (ref 4.0–10.5)
nRBC: 0 % (ref 0.0–0.2)

## 2020-08-09 LAB — LIPASE, BLOOD: Lipase: 27 U/L (ref 11–51)

## 2020-08-09 MED ORDER — METRONIDAZOLE 500 MG PO TABS
500.0000 mg | ORAL_TABLET | Freq: Once | ORAL | Status: AC
  Administered 2020-08-09: 500 mg via ORAL
  Filled 2020-08-09: qty 1

## 2020-08-09 MED ORDER — CIPROFLOXACIN HCL 500 MG PO TABS
500.0000 mg | ORAL_TABLET | Freq: Two times a day (BID) | ORAL | 0 refills | Status: DC
Start: 2020-08-09 — End: 2020-10-12

## 2020-08-09 MED ORDER — CIPROFLOXACIN HCL 500 MG PO TABS
500.0000 mg | ORAL_TABLET | Freq: Once | ORAL | Status: AC
  Administered 2020-08-09: 500 mg via ORAL
  Filled 2020-08-09: qty 1

## 2020-08-09 MED ORDER — METRONIDAZOLE 500 MG PO TABS
500.0000 mg | ORAL_TABLET | Freq: Two times a day (BID) | ORAL | 0 refills | Status: DC
Start: 2020-08-09 — End: 2020-10-12

## 2020-08-09 MED ORDER — ONDANSETRON 4 MG PO TBDP
ORAL_TABLET | ORAL | 0 refills | Status: DC
Start: 2020-08-09 — End: 2021-02-19

## 2020-08-09 MED ORDER — POTASSIUM CHLORIDE CRYS ER 20 MEQ PO TBCR
20.0000 meq | EXTENDED_RELEASE_TABLET | Freq: Once | ORAL | Status: AC
  Administered 2020-08-09: 20 meq via ORAL
  Filled 2020-08-09: qty 1

## 2020-08-09 MED ORDER — ONDANSETRON 4 MG PO TBDP
4.0000 mg | ORAL_TABLET | Freq: Once | ORAL | Status: AC
  Administered 2020-08-09: 4 mg via ORAL
  Filled 2020-08-09: qty 1

## 2020-08-09 NOTE — ED Provider Notes (Signed)
Emergency Medicine Provider Triage Evaluation Note  [name] , a 70 y.o. female  was evaluated in triage.  Pt complains of left lower quadrant pain which began yesterday.  Similar episodes of diverticulitis with the same pain.  Has had nausea, any vomiting, diarrhea.  Previously seen by [facility].  No fever.  Review of Systems  Positive: Abdominal pain,nausea, vomiting, diarrhea Negative: fever  Physical Exam  BP (!) 175/95 (BP Location: Right Arm)   Pulse 98   Temp 98.8 F (37.1 C) (Oral)   Resp 18   SpO2 99%  Gen:   Awake, no distress   Resp:  Normal effort  MSK:   Moves extremities without difficulty  Other:    Medical Decision Making  Medically screening exam initiated at 6:32 PM.  Appropriate orders placed.  [name] was informed that the remainder of the evaluation will be completed by another provider, this initial triage assessment does not replace that evaluation, and the importance of remaining in the ED until their evaluation is complete.  Patient here with recurrent episode of diverticulitis.  Nausea, vomiting, diarrhea.  No blood in her stool.  Previously seen by [facility].   Janeece Fitting, PA-C 08/09/20 1833    Lennice Sites, DO 08/09/20 2131

## 2020-08-09 NOTE — ED Provider Notes (Signed)
[facility] DEPT Provider Note   CSN: [id number] Arrival date & time: 08/09/20  1753     History Chief Complaint  Patient presents with   Abdominal Pain    [name] is a 70 y.o. female history of diabetes, fibromyalgia, hypertension, who presented with abdominal pain.  Patient states that around noon time she has acute onset of lower abdominal pain with cramps.  Patient originally thought she has food poisoning and had an episode of vomiting.  She states that the cramps is persistent and she was admitted for diverticulitis a year ago so she became concerned and came to the ED. Patient denies any fevers.  Patient states that she had a colonoscopy last year and some polyps removed and no malignancy was found  The history is provided by the patient.      Past Medical History:  Diagnosis Date   Diabetes mellitus    Diverticulitis    Fibromyalgia    Hypercholesteremia    Hypertension    IBS (irritable bowel syndrome)    Migraines    Nasal fracture    Stein-Leventhal ovaries     Patient Active Problem List   Diagnosis Date Noted   Diverticulitis 08/02/2019   Essential hypertension 08/02/2019   Diarrhea 08/02/2019   Pancolitis ([place]) 08/02/2019   Hyperlipidemia 05/12/2017   Trigeminal neuralgia of right side of face 01/06/2017   Other nonspecific abnormal cardiovascular system function study 12/19/2011   Type 2 diabetes mellitus with hyperglycemia, with long-term current use of insulin ([place]) 12/19/2011    Past Surgical History:  Procedure Laterality Date   CHOLECYSTECTOMY  1998   KNEE SURGERY Right 2017   LEFT HEART CATHETERIZATION WITH CORONARY ANGIOGRAM N/A 12/19/2011   Procedure: LEFT HEART CATHETERIZATION WITH CORONARY ANGIOGRAM;  Surgeon: Candee Furbish, MD;  Location: [facility] CATH LAB;  Service: Cardiovascular;  Laterality: N/A;   SHOULDER SURGERY     TONSILLECTOMY  1959     OB History   No obstetric history on file.     Family  History  Adopted: Yes  Problem Relation Age of Onset   Failure to thrive Mother    Heart failure Father    Neuropathy Neg Hx     Social History   Tobacco Use   Smoking status: Never   Smokeless tobacco: Never  Vaping Use   Vaping Use: Never used  Substance Use Topics   Alcohol use: Yes    Comment: rare   Drug use: No    Home Medications Prior to Admission medications   Medication Sig Start Date End Date Taking? Authorizing Provider  ACCU-CHEK SMARTVIEW test strip USE AS INSTRUCTED TO TEST TWICE DAILY 08/16/19   Philemon Kingdom, MD  acetaminophen (TYLENOL) 650 MG CR tablet Take 650 mg by mouth every 8 (eight) hours as needed for pain.    [provider]  acetaminophen-codeine (TYLENOL #3) 300-30 MG per tablet Take 1 tablet by mouth 2 (two) times daily as needed for severe pain.     [provider]  cholecalciferol (VITAMIN D) 1000 UNITS tablet Take 1,000 Units by mouth every evening.     [provider]  fluticasone (FLONASE) 50 MCG/ACT nasal spray Place 1 spray into both nostrils daily. 06/01/19   [provider]  hydrochlorothiazide (HYDRODIURIL) 25 MG tablet Take 12.5 mg by mouth daily.    [provider]  HYDROcodone-acetaminophen (NORCO) 7.5-325 MG tablet Take 1 tablet by mouth every 6 (six) hours as needed (Migraine headaches).  [provider]  insulin NPH Human (NOVOLIN N) 100 UNIT/ML injection Inject 18 units in am and 25 units in pm Patient taking differently: Inject 20 Units into the skin 2 (two) times daily before a meal. 10/16/17   Philemon Kingdom, MD  insulin regular (NOVOLIN R RELION) 100 units/mL injection Inject 0.08-0.1 mLs (8-10 Units total) into the skin 2 (two) times daily before a meal. Patient taking differently: Inject 20 Units into the skin 2 (two) times daily before a meal. 05/12/17   Philemon Kingdom, MD  Lancets (ACCU-CHEK MULTICLIX) lancets Use as instructed to test once daily DX E11.65 08/16/19    Philemon Kingdom, MD  metoprolol (LOPRESSOR) 50 MG tablet Take 25 mg by mouth 2 (two) times daily. 06/20/15   [provider]  polyvinyl alcohol (LIQUIFILM TEARS) 1.4 % ophthalmic solution Place 1 drop into both eyes every 6 (six) hours as needed for dry eyes.    [provider]  PRESCRIPTION MEDICATION Patient receives a Steroid injection in the neck once a year with Dr. Mina Marble at [facility] pt is unable to recall the exact name and i have been unable to verify name from the Dr's office. Patient also states " it shoots up my blood sugars really high, but i still get it because i can't take anything else for my neck pain"    [provider]  tiZANidine (ZANAFLEX) 4 MG tablet Take 4 mg by mouth at bedtime as needed for muscle spasms.     [provider]  gabapentin (NEURONTIN) 100 MG capsule Take 1 capsule (100 mg total) by mouth 3 (three) times daily. Patient not taking: Reported on 07/06/2019 07/07/17 07/06/19  Melvenia Beam, MD    Allergies    Contrast media [iodinated diagnostic agents], Erythromycin, Rocephin [ceftriaxone], Statins, Augmentin [amoxicillin-pot clavulanate], Basaglar kwikpen [insulin glargine], Metformin and related, Nsaids, Prednisone, Scallops [shellfish allergy], Sulfa antibiotics, Invokana [canagliflozin], and Tramadol  Review of Systems   Review of Systems  Gastrointestinal:  Positive for abdominal pain and vomiting.  All other systems reviewed and are negative.  Physical Exam Updated Vital Signs BP (!) 175/95 (BP Location: Right Arm)   Pulse 98   Temp 98.8 F (37.1 C) (Oral)   Resp 18   SpO2 99%   Physical Exam Vitals and nursing note reviewed.  Constitutional:      Appearance: She is well-developed.  HENT:     Head: Normocephalic.     Mouth/Throat:     Mouth: Mucous membranes are moist.  Eyes:     Extraocular Movements: Extraocular movements intact.     Pupils: Pupils are equal, round, and reactive to light.   Cardiovascular:     Rate and Rhythm: Normal rate and regular rhythm.     Heart sounds: Normal heart sounds.  Pulmonary:     Effort: Pulmonary effort is normal.     Breath sounds: Normal breath sounds.  Abdominal:     General: Abdomen is flat.     Comments: + LLQ tenderness, no rebound   Skin:    General: Skin is warm.     Capillary Refill: Capillary refill takes less than 2 seconds.  Neurological:     General: No focal deficit present.     Mental Status: She is alert and oriented to person, place, and time.  Psychiatric:        Mood and Affect: Mood normal.        Behavior: Behavior normal.    ED Results / Procedures /  Treatments   Labs (all labs ordered are listed, but only abnormal results are displayed) Labs Reviewed  CBC WITH DIFFERENTIAL/PLATELET - Abnormal; Notable for the following components:      Result Value   WBC 17.5 (*)    Neutro Abs 14.1 (*)    Monocytes Absolute 1.3 (*)    All other components within normal limits  COMPREHENSIVE METABOLIC PANEL - Abnormal; Notable for the following components:   Potassium 3.3 (*)    Glucose, Bld 207 (*)    BUN 28 (*)    Creatinine, Ser 1.06 (*)    GFR, Estimated 56 (*)    All other components within normal limits  URINE CULTURE  LIPASE, BLOOD  URINALYSIS, ROUTINE W REFLEX MICROSCOPIC    EKG None  Radiology CT ABDOMEN PELVIS WO CONTRAST  Result Date: 08/09/2020 CLINICAL DATA:  Vomiting and diarrhea.  Abdominal pain. EXAM: CT ABDOMEN AND PELVIS WITHOUT CONTRAST TECHNIQUE: Multidetector CT imaging of the abdomen and pelvis was performed following the standard protocol without IV contrast. COMPARISON:  08/02/2019 FINDINGS: LOWER CHEST: Normal. HEPATOBILIARY: Normal hepatic contours. No intra- or extrahepatic biliary dilatation. Status post cholecystectomy. PANCREAS: Normal pancreas. No ductal dilatation or peripancreatic fluid collection. SPLEEN: Normal. ADRENALS/URINARY TRACT: The adrenal glands are normal. No  hydronephrosis, nephroureterolithiasis or solid renal mass. The urinary bladder is normal for degree of distention STOMACH/BOWEL: There is no hiatal hernia. Normal duodenal course and caliber. No small bowel dilatation or inflammation. There is sigmoid diverticulosis with a short segment of wall thickening and inflammatory change. Normal appendix. VASCULAR/LYMPHATIC: Normal course and caliber of the major abdominal vessels. No abdominal or pelvic lymphadenopathy. REPRODUCTIVE: Status post hysterectomy. No adnexal mass. MUSCULOSKELETAL. No bony spinal canal stenosis or focal osseous abnormality. OTHER: None. IMPRESSION: Acute sigmoid diverticulitis without abscess or free intraperitoneal air. Electronically Signed   By: Ulyses Jarred M.D.   On: 08/09/2020 19:22    Procedures Procedures   Medications Ordered in ED Medications  ciprofloxacin (CIPRO) tablet 500 mg (has no administration in time range)  ondansetron (ZOFRAN-ODT) disintegrating tablet 4 mg (has no administration in time range)  metroNIDAZOLE (FLAGYL) tablet 500 mg (has no administration in time range)    ED Course  I have reviewed the triage vital signs and the nursing notes.  Pertinent labs & imaging results that were available during my care of the patient were reviewed by me and considered in my medical decision making (see chart for details).    MDM Rules/Calculators/A&P                         Tamila Gaulin is a 70 y.o. female here presenting with left lower quadrant pain.  History of diverticulitis.  Concern for possible diverticulitis.  Plan to get CBC and CMP and CT abdomen pelvis.  11:28 PM White blood cell count and 17.  CT showed acute diverticulitis with no perforation or abscess.  She does not appear septic.  Will give Cipro and Flagyl and have patient follow-up with her doctor.    Final Clinical Impression(s) / ED Diagnoses Final diagnoses:  None    Rx / DC Orders ED Discharge Orders     None         Drenda Freeze, MD 08/09/20 2329

## 2020-08-09 NOTE — Discharge Instructions (Addendum)
Take cipro and flagyl as prescribed   Take zofran for nausea. Stay hydrated   Return to ER if you have worse abdominal pain, vomiting, fever

## 2020-08-09 NOTE — ED Triage Notes (Addendum)
Pt reports vomiting, diarrhea and abdominal spasms that started earlier today. She has a hx of diverticulitis. Had a similar episode about 4 weeks ago. Denies blood in stool.

## 2020-09-12 ENCOUNTER — Telehealth: Payer: Self-pay | Admitting: Internal Medicine

## 2020-09-12 ENCOUNTER — Other Ambulatory Visit: Payer: Self-pay

## 2020-09-12 DIAGNOSIS — E1165 Type 2 diabetes mellitus with hyperglycemia: Secondary | ICD-10-CM

## 2020-09-12 DIAGNOSIS — Z794 Long term (current) use of insulin: Secondary | ICD-10-CM

## 2020-09-12 MED ORDER — ACCU-CHEK SMARTVIEW VI STRP: ORAL_STRIP | 4 refills | Status: DC

## 2020-09-12 NOTE — Telephone Encounter (Signed)
Sent refills to the pharmacy  

## 2020-09-12 NOTE — Telephone Encounter (Signed)
Patient requests new RX with refills:  MEDICATION: ACCU-CHEK SMARTVIEW test strip  PHARMACY:   [place], [place] [id number] Phone:  [phone]  Fax:  [phone]      HAS THE PATIENT CONTACTED [facility]?  No-requires new RX  IS THIS A 90 DAY SUPPLY : ?  IS PATIENT OUT OF MEDICATION: Yes  IF NOT; HOW MUCH IS LEFT: 0  LAST APPOINTMENT DATE: '@3'$ /28/2022  NEXT APPOINTMENT DATE:'@8'$ /16/2022  DO WE HAVE YOUR PERMISSION TO LEAVE A DETAILED MESSAGE?:Yes  OTHER COMMENTS:    **Let patient know to contact pharmacy at the end of the day to make sure medication is ready. **  ** Please notify patient to allow 48-72 hours to process**  **Encourage patient to contact the pharmacy for refills or they can request refills through [facility]**

## 2020-09-19 ENCOUNTER — Ambulatory Visit (INDEPENDENT_AMBULATORY_CARE_PROVIDER_SITE_OTHER): Payer: Medicare Other | Admitting: Internal Medicine

## 2020-09-19 ENCOUNTER — Encounter: Payer: Self-pay | Admitting: Internal Medicine

## 2020-09-19 ENCOUNTER — Other Ambulatory Visit: Payer: Self-pay

## 2020-09-19 VITALS — BP 150/88 | HR 79 | Ht 65.0 in

## 2020-09-19 DIAGNOSIS — Z794 Long term (current) use of insulin: Secondary | ICD-10-CM

## 2020-09-19 DIAGNOSIS — E785 Hyperlipidemia, unspecified: Secondary | ICD-10-CM

## 2020-09-19 DIAGNOSIS — Z789 Other specified health status: Secondary | ICD-10-CM | POA: Diagnosis not present

## 2020-09-19 DIAGNOSIS — E1165 Type 2 diabetes mellitus with hyperglycemia: Secondary | ICD-10-CM

## 2020-09-19 LAB — POCT GLYCOSYLATED HEMOGLOBIN (HGB A1C): Hemoglobin A1C: 10.4 % — AB (ref 4.0–5.6)

## 2020-09-19 MED ORDER — OZEMPIC (0.25 OR 0.5 MG/DOSE) 2 MG/1.5ML ~~LOC~~ SOPN
0.5000 mg | PEN_INJECTOR | SUBCUTANEOUS | 5 refills | Status: DC
Start: 2020-09-19 — End: 2021-01-01

## 2020-09-19 NOTE — Patient Instructions (Addendum)
Please use the following insulin doses Insulin Before breakfast Before lunch Before dinner  Regular 25-30 10-12 25-30  NPH 30  30  Please inject the insulin 30 min before meals.  Please start Ozempic 0.25 mg weekly in a.m. (for example on Sunday morning) x 4 weeks, then increase to 0.5 mg weekly in a.m. if no nausea or hypoglycemia.  Please return in 3 months with your sugar log.

## 2020-09-19 NOTE — Progress Notes (Signed)
Patient ID: [name], female   DOB: 12-18-1950, 70 y.o.   MRN: [id number]   This visit occurred during the SARS-CoV-2 public health emergency.  Safety protocols were in place, including screening questions prior to the visit, additional usage of staff PPE, and extensive cleaning of exam room while observing appropriate contact time as indicated for disinfecting solutions.   HPI: [name] is a 70 y.o.-year-old female, returning for f/u for DM2, dx in 2006-2007, insulin-dependent since 2017, uncontrolled, without long term complications.  She saw Dr. Buddy Duty in the past.  Last visit with me 5 months ago. Started to go to [facility].  Interim history: She continues to have cervical spine steroid injections.  Sugars are higher after these injections.  She had 3 injections since last visit.  She feels that these are helping. She also had diverticulitis since last OV. She has a joint pains. She also has mm cramps >> on Tizanidine.  Reviewed HbA1c levels: Lab Results  Component Value Date   HGBA1C 10.0 (A) 05/01/2020   HGBA1C 9.0 (H) 08/02/2019   HGBA1C 8.3 (A) 10/26/2018  12/09/2016: HbA1c 10.7% 07/10/2016: HbA1c 9.6%  Pt was on a regimen of: - NPH 25 units at bedtime - Actos 15 mg in a.m.  She is now on:  Insulin Before breakfast Before lunch Before dinner  Regular 20 >> 20-25  >> using 20 8-10 - may miss, or takes 10 20 >> 20-25 >> using 20  NPH 20 >> 25 - but using 20  25 - but using 20   She has tried: Metformin >> stomach cramps, diarrhea Invokana >> dry mouth, rash on face Basaglar >> back muscle spasms, congestion She could not afford Trulicity. We stopped Actos 10/2017  Pt checks her sugars 1-2 times a day: - am:  113-143 >> 87-251, 308 >> 149-275, 292 >> 137-382 - 2h after b'fast: 140-205, 244  >> 190 >> 184, 244 >> n/c  - before lunch:235 >> 73, 99-166, 176 >> n/c >> 69-210 >> n/c >> 107 - 2h after lunch: n/c  - snack - before dinner: 140-150s, 173 >> n/c  >> 113, 180-317, 346 >> 197-313 - 2h after dinner: n/c >> 129, 189 >> n/c >> 248 >> n/c >> 209 - bedtime:  95, 127-145 >> n/c >> 253 >> n/c >> 238 >> n/c - nighttime: n/c >> 66 >> n/c Lowest sugar was  113 >> 69 >> 113 >> 107; it is unclear at which level she has hypoglycemia awareness Highest sugar was  173 >> 308.>> 346 >> 538, 580 (steroid)  Glucometer: Accu-Chek  Pt's meals are: - Breakfast: oatmeal, Kashi cereal, English muffin, whole wheat bread - Lunch: sandwich, salad - Dinner: (late, 8 pm): veggies, meat or pasta or eggs + toast (largest) - Snacks: none + Coke 0, tea + Splenda  -No CKD last BUN/creatinine:  Lab Results  Component Value Date   BUN 28 (H) 08/09/2020   BUN 20 08/05/2019   CREATININE 1.06 (H) 08/09/2020   CREATININE 0.75 08/05/2019  07/10/2016: ACR 5.5  -+ HL last set of lipids: 06/01/2019: 273/165/52/151 01/15/2019:206/221/50/118 01/06/2018: 204/115/53/129 12/10/2016: 226/113/57/147. No results found for: CHOL, HDL, LDLCALC, LDLDIRECT, TRIG, CHOLHDL  She is not on a statin.  Lovastatin, rosuvastatin, and atorvastatin and other 3 statins caused muscle aches.   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 [name] - Dr. Jerline Pain.  -No numbness and tingling in her feet She is on Neurontin 100 mg 3  times a day but for trigeminal neuralgia.  On ASA 81.  Unclear FH of DM -patient is adopted.  She also has a history of HTN, fibromyalgia, vitamin D deficiency, PCOS, fatty liver, kidney stones, trigeminal neuralgia, migraines.  ROS: + See HPI  I reviewed pt's medications, allergies, PMH, social hx, family hx, and changes were documented in the history of present illness. Otherwise, unchanged from my initial visit note.  Past Medical History:  Diagnosis Date   Diabetes mellitus    Diverticulitis    Fibromyalgia    Hypercholesteremia    Hypertension    IBS (irritable bowel syndrome)    Migraines    Nasal fracture    Stein-[place]  ovaries    Past Surgical History:  Procedure Laterality Date   CHOLECYSTECTOMY  1998   KNEE SURGERY Right 2017   LEFT HEART CATHETERIZATION WITH CORONARY ANGIOGRAM N/A 12/19/2011   Procedure: LEFT HEART CATHETERIZATION WITH CORONARY ANGIOGRAM;  Surgeon: Candee Furbish, MD;  Location: [facility] CATH LAB;  Service: Cardiovascular;  Laterality: N/A;   SHOULDER SURGERY     TONSILLECTOMY  1959   Social History   Socioeconomic History   Marital status: Single    Spouse name: Not on file   Number of children: 0   Years of education: Some college   Highest education level: Not on file  Occupational History   Occupation: accounting    Comment: [employer]  Tobacco Use   Smoking status: Never   Smokeless tobacco: Never  Vaping Use   Vaping Use: Never used  Substance and Sexual Activity   Alcohol use: Yes    Comment: rare   Drug use: No   Sexual activity: Not on file  Other Topics Concern   Not on file  Social History Narrative   Lives alone w/ her 3 cats   Right-handed   Caffeine: 2 cups coffee/tea per day   Social Determinants of Health   Financial Resource Strain: Not on file  Food Insecurity: Not on file  Transportation Needs: Not on file  Physical Activity: Not on file  Stress: Not on file  Social Connections: Not on file  Intimate Partner Violence: Not on file   Current Outpatient Medications on File Prior to Visit  Medication Sig Dispense Refill   ACCU-CHEK SMARTVIEW test strip USE AS INSTRUCTED TO TEST TWICE DAILY 100 each 4   acetaminophen (TYLENOL) 650 MG CR tablet Take 650 mg by mouth every 8 (eight) hours as needed for pain.     acetaminophen-codeine (TYLENOL #3) 300-30 MG per tablet Take 1 tablet by mouth 2 (two) times daily as needed for severe pain.      cholecalciferol (VITAMIN D) 1000 UNITS tablet Take 1,000 Units by mouth every evening.      ciprofloxacin (CIPRO) 500 MG tablet Take 1 tablet (500 mg total) by mouth 2 (two) times daily. One po bid x 7 days 14  tablet 0   fluticasone (FLONASE) 50 MCG/ACT nasal spray Place 1 spray into both nostrils daily.     hydrochlorothiazide (HYDRODIURIL) 25 MG tablet Take 12.5 mg by mouth daily.     HYDROcodone-acetaminophen (NORCO) 7.5-325 MG tablet Take 1 tablet by mouth every 6 (six) hours as needed (Migraine headaches).      insulin NPH Human (NOVOLIN N) 100 UNIT/ML injection Inject 18 units in am and 25 units in pm (Patient taking differently: Inject 20 Units into the skin 2 (two) times daily before a meal.) 10 mL 11   insulin regular (NOVOLIN  R RELION) 100 units/mL injection Inject 0.08-0.1 mLs (8-10 Units total) into the skin 2 (two) times daily before a meal. (Patient taking differently: Inject 20 Units into the skin 2 (two) times daily before a meal.) 10 mL 11   Lancets (ACCU-CHEK MULTICLIX) lancets Use as instructed to test once daily DX E11.65 100 each 4   metoprolol (LOPRESSOR) 50 MG tablet Take 25 mg by mouth 2 (two) times daily.  0   metroNIDAZOLE (FLAGYL) 500 MG tablet Take 1 tablet (500 mg total) by mouth 2 (two) times daily. One po bid x 7 days 14 tablet 0   ondansetron (ZOFRAN ODT) 4 MG disintegrating tablet '4mg'$  ODT q4 hours prn nausea/vomit 10 tablet 0   polyvinyl alcohol (LIQUIFILM TEARS) 1.4 % ophthalmic solution Place 1 drop into both eyes every 6 (six) hours as needed for dry eyes.     PRESCRIPTION MEDICATION Patient receives a Steroid injection in the neck once a year with Dr. Mina Marble at [facility] pt is unable to recall the exact name and i have been unable to verify name from the Dr's office. Patient also states " it shoots up my blood sugars really high, but i still get it because i can't take anything else for my neck pain"     tiZANidine (ZANAFLEX) 4 MG tablet Take 4 mg by mouth at bedtime as needed for muscle spasms.      [DISCONTINUED] gabapentin (NEURONTIN) 100 MG capsule Take 1 capsule (100 mg total) by mouth 3 (three) times daily. (Patient not taking: Reported on 07/06/2019) 90  capsule 12   Current Facility-Administered Medications on File Prior to Visit  Medication Dose Route Frequency Provider Last Rate Last Admin   methylPREDNISolone sodium succinate (SOLU-MEDROL) 130 mg in sodium chloride 0.9 % 50 mL IVPB  130 mg Intravenous Once Jerline Pain, MD       Allergies  Allergen Reactions   Contrast Media [Iodinated Diagnostic Agents] Hives   Erythromycin Anaphylaxis   Rocephin [Ceftriaxone] Hives and Itching   Statins Other (See Comments)    Severe muscle pain/weakness   Augmentin [Amoxicillin-Pot Clavulanate] Nausea And Vomiting    Nausea, vomiting, headache, dizziness. Hives on 08/2019   Basaglar Kwikpen [Insulin Glargine]     Severe ankle swelling   Metformin And Related Diarrhea and Nausea And Vomiting    Violently ill for 2 yrs   Nsaids Other (See Comments)    Stomach problems   Prednisone Other (See Comments)    Abnormal blood sugars.Elyse Hsu [Shellfish Allergy] Diarrhea and Nausea And Vomiting    Severe GI upset   Sulfa Antibiotics Other (See Comments)    blackout   Invokana [Canagliflozin] Rash and Other (See Comments)    Dehydration   Tramadol Rash   Family History  Adopted: Yes  Problem Relation Age of Onset   Failure to thrive Mother    Heart failure Father    Neuropathy Neg Hx    PE: BP (!) 150/88 (BP Location: Right Arm, Patient Position: Sitting, Cuff Size: Normal)   Pulse 79   Ht '5\' 5"'$  (1.651 m)   SpO2 98%   BMI 31.95 kg/m  Refused being weighed.  Wt Readings from Last 3 Encounters:  08/02/19 192 lb 0.3 oz (87.1 kg)  07/06/19 192 lb (87.1 kg)  10/26/18 189 lb (85.7 kg)   Constitutional: overweight, in NAD Eyes: PERRLA, EOMI, no exophthalmos ENT: moist mucous membranes, no thyromegaly, no cervical lymphadenopathy Cardiovascular: RRR, No MRG Respiratory: CTA B Gastrointestinal:  abdomen soft, NT, ND, BS+ Musculoskeletal: no deformities, strength intact in all 4 Skin: moist, warm, no rashes Neurological: no tremor  with outstretched hands, DTR normal in all 4  ASSESSMENT: 1. DM2, insulin-dependent, uncontrolled, without long-term complications, but with hyperglycemia  + hypertensive retinopathy  No known family history of medullary thyroid cancer (she is adopted).  No personal history of pancreatitis.  2. HL  PLAN:  1. Patient with longstanding, uncontrolled, type 2 diabetes, on basal-bolus insulin regimen with NPH and regular insulin due to price.  She could not tolerate Basaglar in the past and could not afford a GLP-1 receptor agonist.  In 2019, we stopped Actos due to improved blood sugars and also significant weight gain and LE edema.  She did lose weight afterwards and her edema decreased.  Her diabetes control is limited by her erratic schedule and forgetting insulin doses.  At last visit we increased the insulin doses due to significantly higher blood sugars especially after getting steroid injections in her spine.  She is not exercising due to back pain.  She also eats late dinner after coming back late from work, around 9 PM. -At today's visit, reviewing her regimen, it appears that she did not make the insulin dose changes recommended at last visit, despite the very high blood sugars... At this visit, we discussed that she can go up on the insulin doses herself if she continues to see high blood sugars, she does not need to wait until she sees me to do this.  For now, we will need to increase the insulin doses quite significantly but I also would want her to try again to add a GLP-1 receptor agonist.  Trulicity was not affordable in the past and she does not qualify for patient assistance due to the fact that she is working.  She is not planning to retire soon.  I did print her a prescription for Ozempic to check with the pharmacist and see if this is affordable.  Darcel Bayley would be an even better option, but I do not think that this is covered by her insurance. - I suggested to:  Patient Instructions   Please use the following insulin doses Insulin Before breakfast Before lunch Before dinner  Regular 25-30 10-12 25-30  NPH 30  30  Please inject the insulin 30 min before meals.  Please start Ozempic 0.25 mg weekly in a.m. (for example on Sunday morning) x 4 weeks, then increase to 0.5 mg weekly in a.m. if no nausea or hypoglycemia.  Please return in 3 months with your sugar log.   - we checked her HbA1c: 10.4% (even higher than before) - advised to check sugars at different times of the day - 4x a day, rotating check times - advised for yearly eye exams >> she is UTD - return to clinic in 3-4 months   2. HL - Reviewed latest lipid panel from 05/2019:233/165/52/151: LDL still higher than target, triglycerides slightly high -She is not on a statin due to previous intolerance to 6 of them reportedly -She will have labs with PCP later in the year.  I would prefer to improve her diabetes I will be more before checking her lipids.  3.  Statin intolerance -She had muscle aches with several statins in the past -See above  Philemon Kingdom, MD PhD [facility] Endocrinology

## 2020-10-12 ENCOUNTER — Encounter (HOSPITAL_BASED_OUTPATIENT_CLINIC_OR_DEPARTMENT_OTHER): Payer: Self-pay | Admitting: Emergency Medicine

## 2020-10-12 ENCOUNTER — Emergency Department (HOSPITAL_BASED_OUTPATIENT_CLINIC_OR_DEPARTMENT_OTHER)
Admission: EM | Admit: 2020-10-12 | Discharge: 2020-10-12 | Disposition: A | Discharge status: Home / Self Care | Payer: Medicare Other | Attending: Emergency Medicine | Admitting: Emergency Medicine

## 2020-10-12 ENCOUNTER — Emergency Department (HOSPITAL_BASED_OUTPATIENT_CLINIC_OR_DEPARTMENT_OTHER): Payer: Medicare Other

## 2020-10-12 ENCOUNTER — Other Ambulatory Visit: Payer: Self-pay

## 2020-10-12 DIAGNOSIS — K5792 Diverticulitis of intestine, part unspecified, without perforation or abscess without bleeding: Secondary | ICD-10-CM | POA: Insufficient documentation

## 2020-10-12 DIAGNOSIS — Z794 Long term (current) use of insulin: Secondary | ICD-10-CM | POA: Insufficient documentation

## 2020-10-12 DIAGNOSIS — Z79899 Other long term (current) drug therapy: Secondary | ICD-10-CM | POA: Diagnosis not present

## 2020-10-12 DIAGNOSIS — E119 Type 2 diabetes mellitus without complications: Secondary | ICD-10-CM | POA: Diagnosis not present

## 2020-10-12 DIAGNOSIS — I10 Essential (primary) hypertension: Secondary | ICD-10-CM | POA: Diagnosis not present

## 2020-10-12 DIAGNOSIS — R1032 Left lower quadrant pain: Secondary | ICD-10-CM | POA: Diagnosis present

## 2020-10-12 LAB — CBC
HCT: 42.7 % (ref 36.0–46.0)
Hemoglobin: 14.6 g/dL (ref 12.0–15.0)
MCH: 30.1 pg (ref 26.0–34.0)
MCHC: 34.2 g/dL (ref 30.0–36.0)
MCV: 88 fL (ref 80.0–100.0)
Platelets: 249 10*3/uL (ref 150–400)
RBC: 4.85 MIL/uL (ref 3.87–5.11)
RDW: 12.7 % (ref 11.5–15.5)
WBC: 13.4 10*3/uL — ABNORMAL HIGH (ref 4.0–10.5)
nRBC: 0 % (ref 0.0–0.2)

## 2020-10-12 LAB — COMPREHENSIVE METABOLIC PANEL
ALT: 11 U/L (ref 0–44)
AST: 14 U/L — ABNORMAL LOW (ref 15–41)
Albumin: 4.2 g/dL (ref 3.5–5.0)
Alkaline Phosphatase: 81 U/L (ref 38–126)
Anion gap: 12 (ref 5–15)
BUN: 24 mg/dL — ABNORMAL HIGH (ref 8–23)
CO2: 30 mmol/L (ref 22–32)
Calcium: 9.7 mg/dL (ref 8.9–10.3)
Chloride: 99 mmol/L (ref 98–111)
Creatinine, Ser: 0.84 mg/dL (ref 0.44–1.00)
GFR, Estimated: >60 mL/min (ref >60)
Glucose, Bld: 123 mg/dL — ABNORMAL HIGH (ref 70–99)
Potassium: 3 mmol/L — ABNORMAL LOW (ref 3.5–5.1)
Sodium: 141 mmol/L (ref 135–145)
Total Bilirubin: 0.7 mg/dL (ref 0.3–1.2)
Total Protein: 7.6 g/dL (ref 6.5–8.1)

## 2020-10-12 LAB — URINALYSIS, ROUTINE W REFLEX MICROSCOPIC
Bilirubin Urine: NEGATIVE
Glucose, UA: NEGATIVE mg/dL
Hgb urine dipstick: NEGATIVE
Ketones, ur: NEGATIVE mg/dL
Leukocytes,Ua: NEGATIVE
Nitrite: NEGATIVE
Protein, ur: NEGATIVE mg/dL
Specific Gravity, Urine: 1.025 (ref 1.005–1.030)
pH: 5.5 (ref 5.0–8.0)

## 2020-10-12 LAB — LIPASE, BLOOD: Lipase: 17 U/L (ref 11–51)

## 2020-10-12 MED ORDER — SODIUM CHLORIDE 0.9 % IV BOLUS
500.0000 mL | Freq: Once | INTRAVENOUS | Status: AC
  Administered 2020-10-12: 500 mL via INTRAVENOUS

## 2020-10-12 MED ORDER — FENTANYL CITRATE PF 50 MCG/ML IJ SOSY
50.0000 ug | PREFILLED_SYRINGE | Freq: Once | INTRAMUSCULAR | Status: AC
  Administered 2020-10-12: 50 ug via INTRAVENOUS
  Filled 2020-10-12: qty 1

## 2020-10-12 MED ORDER — ONDANSETRON HCL 4 MG/2ML IJ SOLN
4.0000 mg | Freq: Once | INTRAMUSCULAR | Status: AC
  Administered 2020-10-12: 4 mg via INTRAVENOUS
  Filled 2020-10-12: qty 2

## 2020-10-12 MED ORDER — CIPROFLOXACIN HCL 500 MG PO TABS: 500.0000 mg | ORAL_TABLET | Freq: Two times a day (BID) | ORAL | 0 refills | Status: DC

## 2020-10-12 MED ORDER — METRONIDAZOLE 500 MG PO TABS: 500.0000 mg | ORAL_TABLET | Freq: Two times a day (BID) | ORAL | 0 refills | Status: DC

## 2020-10-12 MED ORDER — METRONIDAZOLE 500 MG PO TABS
500.0000 mg | ORAL_TABLET | Freq: Once | ORAL | Status: AC
  Administered 2020-10-12: 500 mg via ORAL
  Filled 2020-10-12: qty 1

## 2020-10-12 MED ORDER — CIPROFLOXACIN HCL 500 MG PO TABS
500.0000 mg | ORAL_TABLET | Freq: Once | ORAL | Status: AC
  Administered 2020-10-12: 500 mg via ORAL
  Filled 2020-10-12: qty 1

## 2020-10-12 NOTE — ED Triage Notes (Signed)
Pt arrives pov with c/o abdominal pain and n/v, hx of diverticulitis. Pt denies CP

## 2020-10-12 NOTE — ED Notes (Signed)
Patient left ED with ABCs intact, alert and oriented x4, respirations even and unlabored. Discharge instructions reviewed and all questions answered.   

## 2020-10-12 NOTE — ED Provider Notes (Signed)
[facility] Provider Note   CSN: [id number] Arrival date & time: 10/12/20  1531     History Chief Complaint  Patient presents with   Abdominal Pain    [name] is a 70 y.o. female.  Patient is a 70 year old female who presents with left side abdominal pain.  She has a history of recent diverticulitis x2 in July of this year.  She reports a 1 day history of pain in her left side abdomen associate with some nausea or vomiting.  No change in her stool.  No fevers.  No urinary symptoms.  She is previously seeing a gastroenterologist for colonoscopy but has not seen a gastroenterologist for these episodes of diverticulitis.  She has not taken anything at home for the pain.      Past Medical History:  Diagnosis Date   Diabetes mellitus    Diverticulitis    Fibromyalgia    Hypercholesteremia    Hypertension    IBS (irritable bowel syndrome)    Migraines    Nasal fracture    Stein-[place]hal ovaries     Patient Active Problem List   Diagnosis Date Noted   Diverticulitis 08/02/2019   Essential hypertension 08/02/2019   Diarrhea 08/02/2019   Pancolitis ([place]) 08/02/2019   Hyperlipidemia 05/12/2017   Trigeminal neuralgia of right side of face 01/06/2017   Other nonspecific abnormal cardiovascular system function study 12/19/2011   Type 2 diabetes mellitus with hyperglycemia, with long-term current use of insulin ([place]) 12/19/2011    Past Surgical History:  Procedure Laterality Date   CHOLECYSTECTOMY  1998   KNEE SURGERY Right 2017   LEFT HEART CATHETERIZATION WITH CORONARY ANGIOGRAM N/A 12/19/2011   Procedure: LEFT HEART CATHETERIZATION WITH CORONARY ANGIOGRAM;  Surgeon: Candee Furbish, MD;  Location: [facility] [address] CATH LAB;  Service: Cardiovascular;  Laterality: N/A;   SHOULDER SURGERY     TONSILLECTOMY  1959     OB History   No obstetric history on file.     Family History  Adopted: Yes  Problem Relation Age of Onset   Failure to thrive Mother     Heart failure Father    Neuropathy Neg Hx     Social History   Tobacco Use   Smoking status: Never   Smokeless tobacco: Never  Vaping Use   Vaping Use: Never used  Substance Use Topics   Alcohol use: Yes    Comment: rare   Drug use: No    Home Medications Prior to Admission medications   Medication Sig Start Date End Date Taking? Authorizing Provider  ciprofloxacin (CIPRO) 500 MG tablet Take 1 tablet (500 mg total) by mouth every 12 (twelve) hours. 10/12/20  Yes Malvin Johns, MD  metroNIDAZOLE (FLAGYL) 500 MG tablet Take 1 tablet (500 mg total) by mouth 2 (two) times daily. 10/12/20  Yes Malvin Johns, MD  ACCU-CHEK SMARTVIEW test strip USE AS INSTRUCTED TO TEST TWICE DAILY 09/12/20   Philemon Kingdom, MD  acetaminophen (TYLENOL) 650 MG CR tablet Take 650 mg by mouth every 8 (eight) hours as needed for pain.    [provider]  acetaminophen-codeine (TYLENOL #3) 300-30 MG per tablet Take 1 tablet by mouth 2 (two) times daily as needed for severe pain.     [provider]  cholecalciferol (VITAMIN D) 1000 UNITS tablet Take 1,000 Units by mouth every evening.     [provider]  fluticasone (FLONASE) 50 MCG/ACT nasal spray Place 1 spray into both nostrils daily. 06/01/19   [provider]  hydrochlorothiazide (HYDRODIURIL) 25 MG tablet Take 12.5 mg by mouth daily.    [provider]  HYDROcodone-acetaminophen (NORCO) 7.5-325 MG tablet Take 1 tablet by mouth every 6 (six) hours as needed (Migraine headaches).     [provider]  insulin NPH Human (NOVOLIN N) 100 UNIT/ML injection Inject 18 units in am and 25 units in pm Patient taking differently: Inject 20 Units into the skin 2 (two) times daily before a meal. 10/16/17   Philemon Kingdom, MD  insulin regular (NOVOLIN R RELION) 100 units/mL injection Inject 0.08-0.1 mLs (8-10 Units total) into the skin 2 (two) times daily before a meal. Patient taking differently: Inject 20 Units  into the skin 2 (two) times daily before a meal. 05/12/17   Philemon Kingdom, MD  Lancets (ACCU-CHEK MULTICLIX) lancets Use as instructed to test once daily DX E11.65 08/16/19   Philemon Kingdom, MD  metoprolol (LOPRESSOR) 50 MG tablet Take 25 mg by mouth 2 (two) times daily. 06/20/15   [provider]  ondansetron (ZOFRAN ODT) 4 MG disintegrating tablet '4mg'$  ODT q4 hours prn nausea/vomit 08/09/20   Drenda Freeze, MD  polyvinyl alcohol (LIQUIFILM TEARS) 1.4 % ophthalmic solution Place 1 drop into both eyes every 6 (six) hours as needed for dry eyes.    [provider]  PRESCRIPTION MEDICATION Patient receives a Steroid injection in the neck once a year with Dr. Mina Marble at [facility] pt is unable to recall the exact name and i have been unable to verify name from the Dr's office. Patient also states " it shoots up my blood sugars really high, but i still get it because i can't take anything else for my neck pain"    [provider]  Semaglutide,0.25 or 0.'5MG'$ /DOS, (OZEMPIC, 0.25 OR 0.5 MG/DOSE,) 2 MG/1.5ML SOPN Inject 0.5 mg into the skin once a week. 09/19/20   Philemon Kingdom, MD  gabapentin (NEURONTIN) 100 MG capsule Take 1 capsule (100 mg total) by mouth 3 (three) times daily. Patient not taking: No sig reported 07/07/17 07/06/19  Melvenia Beam, MD    Allergies    Contrast media [iodinated diagnostic agents], Erythromycin, Rocephin [ceftriaxone], Statins, Augmentin [amoxicillin-pot clavulanate], Basaglar kwikpen [insulin glargine], Metformin and related, Nsaids, Prednisone, Scallops [shellfish allergy], Sulfa antibiotics, Invokana [canagliflozin], and Tramadol  Review of Systems   Review of Systems  Constitutional:  Negative for chills, diaphoresis, fatigue and fever.  HENT:  Negative for congestion, rhinorrhea and sneezing.   Eyes: Negative.   Respiratory:  Negative for cough, chest tightness and shortness of breath.   Cardiovascular:  Negative for chest pain  and leg swelling.  Gastrointestinal:  Positive for abdominal pain, nausea and vomiting. Negative for blood in stool and diarrhea.  Genitourinary:  Negative for difficulty urinating, flank pain, frequency and hematuria.  Musculoskeletal:  Negative for arthralgias and back pain.  Skin:  Negative for rash.  Neurological:  Negative for dizziness, speech difficulty, weakness, numbness and headaches.   Physical Exam Updated Vital Signs BP (!) 143/62 (BP Location: Right Arm)   Pulse 74   Temp 98.4 F (36.9 C) (Oral)   Resp 18   Ht '5\' 5"'$  (1.651 m)   Wt 85.3 kg   SpO2 100%   BMI 31.28 kg/m   Physical Exam Constitutional:      Appearance: She is well-developed.  HENT:     Head: Normocephalic and atraumatic.  Eyes:     Pupils: Pupils are equal, round, and reactive to light.  Cardiovascular:  Rate and Rhythm: Normal rate and regular rhythm.     Heart sounds: Normal heart sounds.  Pulmonary:     Effort: Pulmonary effort is normal. No respiratory distress.     Breath sounds: Normal breath sounds. No wheezing or rales.  Chest:     Chest wall: No tenderness.  Abdominal:     General: Bowel sounds are normal.     Palpations: Abdomen is soft.     Tenderness: There is abdominal tenderness in the left upper quadrant and left lower quadrant. There is no guarding or rebound.  Musculoskeletal:        General: Normal range of motion.     Cervical back: Normal range of motion and neck supple.  Lymphadenopathy:     Cervical: No cervical adenopathy.  Skin:    General: Skin is warm and dry.     Findings: No rash.  Neurological:     Mental Status: She is alert and oriented to person, place, and time.    ED Results / Procedures / Treatments   Labs (all labs ordered are listed, but only abnormal results are displayed) Labs Reviewed  COMPREHENSIVE METABOLIC PANEL - Abnormal; Notable for the following components:      Result Value   Potassium 3.0 (*)    Glucose, Bld 123 (*)    BUN 24 (*)     AST 14 (*)    All other components within normal limits  CBC - Abnormal; Notable for the following components:   WBC 13.4 (*)    All other components within normal limits  LIPASE, BLOOD  URINALYSIS, ROUTINE W REFLEX MICROSCOPIC    EKG EKG Interpretation  Date/Time:  Thursday October 12 2020 16:23:40 EDT Ventricular Rate:  82 PR Interval:  174 QRS Duration: 88 QT Interval:  364 QTC Calculation: 425 R Axis:   35 Text Interpretation: Normal sinus rhythm Cannot rule out Anterior infarct , age undetermined Abnormal ECG since last tracing no significant change Confirmed by Malvin Johns [phone]) on 10/12/2020 6:24:18 PM  Radiology CT Abdomen Pelvis Wo Contrast  Result Date: 10/12/2020 CLINICAL DATA:  Abdominal pain, nausea, vomiting. EXAM: CT ABDOMEN AND PELVIS WITHOUT CONTRAST TECHNIQUE: Multidetector CT imaging of the abdomen and pelvis was performed following the standard protocol without IV contrast. COMPARISON:  August 09, 2020. FINDINGS: Lower chest: No acute abnormality. Hepatobiliary: No focal liver abnormality is seen. Status post cholecystectomy. No biliary dilatation. Pancreas: Unremarkable. No pancreatic ductal dilatation or surrounding inflammatory changes. Spleen: Normal in size without focal abnormality. Adrenals/Urinary Tract: Adrenal glands are unremarkable. Kidneys are normal, without renal calculi, focal lesion, or hydronephrosis. Bladder is unremarkable. Stomach/Bowel: Stomach appears normal. The appendix appears normal. There is no evidence of bowel obstruction. Stool is seen throughout the colon. Mild sigmoid diverticulosis is noted. There remains mild inflammatory changes around the proximal sigmoid colon suggesting mild sigmoid diverticulitis. Vascular/Lymphatic: Aortic atherosclerosis. No enlarged abdominal or pelvic lymph nodes. Reproductive: Uterus and bilateral adnexa are unremarkable. Other: No abdominal wall hernia or abnormality. No abdominopelvic ascites.  Musculoskeletal: No acute or significant osseous findings. IMPRESSION: Findings consistent with mild proximal sigmoid diverticulitis without abscess formation. Aortic Atherosclerosis (ICD10-I70.0). Electronically Signed   By: Marijo Conception M.D.   On: 10/12/2020 19:07    Procedures Procedures   Medications Ordered in ED Medications  ciprofloxacin (CIPRO) tablet 500 mg (has no administration in time range)  metroNIDAZOLE (FLAGYL) tablet 500 mg (has no administration in time range)  sodium chloride 0.9 % bolus 500 mL (0 mLs  Intravenous Stopped 10/12/20 1921)  ondansetron (ZOFRAN) injection 4 mg (4 mg Intravenous Given 10/12/20 1745)  fentaNYL (SUBLIMAZE) injection 50 mcg (50 mcg Intravenous Given 10/12/20 1746)    ED Course  I have reviewed the triage vital signs and the nursing notes.  Pertinent labs & imaging results that were available during my care of the patient were reviewed by me and considered in my medical decision making (see chart for details).    MDM Rules/Calculators/A&P                           Patient is a 70 year old female who presents with left lower quadrant abdominal pain.  Her labs show mild elevation in her WBC count.  She had a CT scan which shows diverticulitis without evidence of perforation or abscess.  She is otherwise well-appearing.  She was discharged home in good condition.  She was started on Cipro and Flagyl.  She was encouraged to follow-up with her PCP.  I did discuss with her that given this is the third time she has had diverticulitis, it may be prudent to follow-up with a gastroenterologist but she can discuss this with her PCP.  Return precautions were given. Final Clinical Impression(s) / ED Diagnoses Final diagnoses:  Diverticulitis    Rx / DC Orders ED Discharge Orders          Ordered    ciprofloxacin (CIPRO) 500 MG tablet  Every 12 hours        10/12/20 1949    metroNIDAZOLE (FLAGYL) 500 MG tablet  2 times daily        10/12/20 1949              Malvin Johns, MD 10/12/20 [phone]

## 2020-10-13 IMAGING — CR DG CHEST 2V
2 series · 2 of 2 positions shown · non-contrast
Comparison: None.

CLINICAL DATA: Palpitations and chest pain.

EXAM:
CHEST - 2 VIEW

[w chest pa]
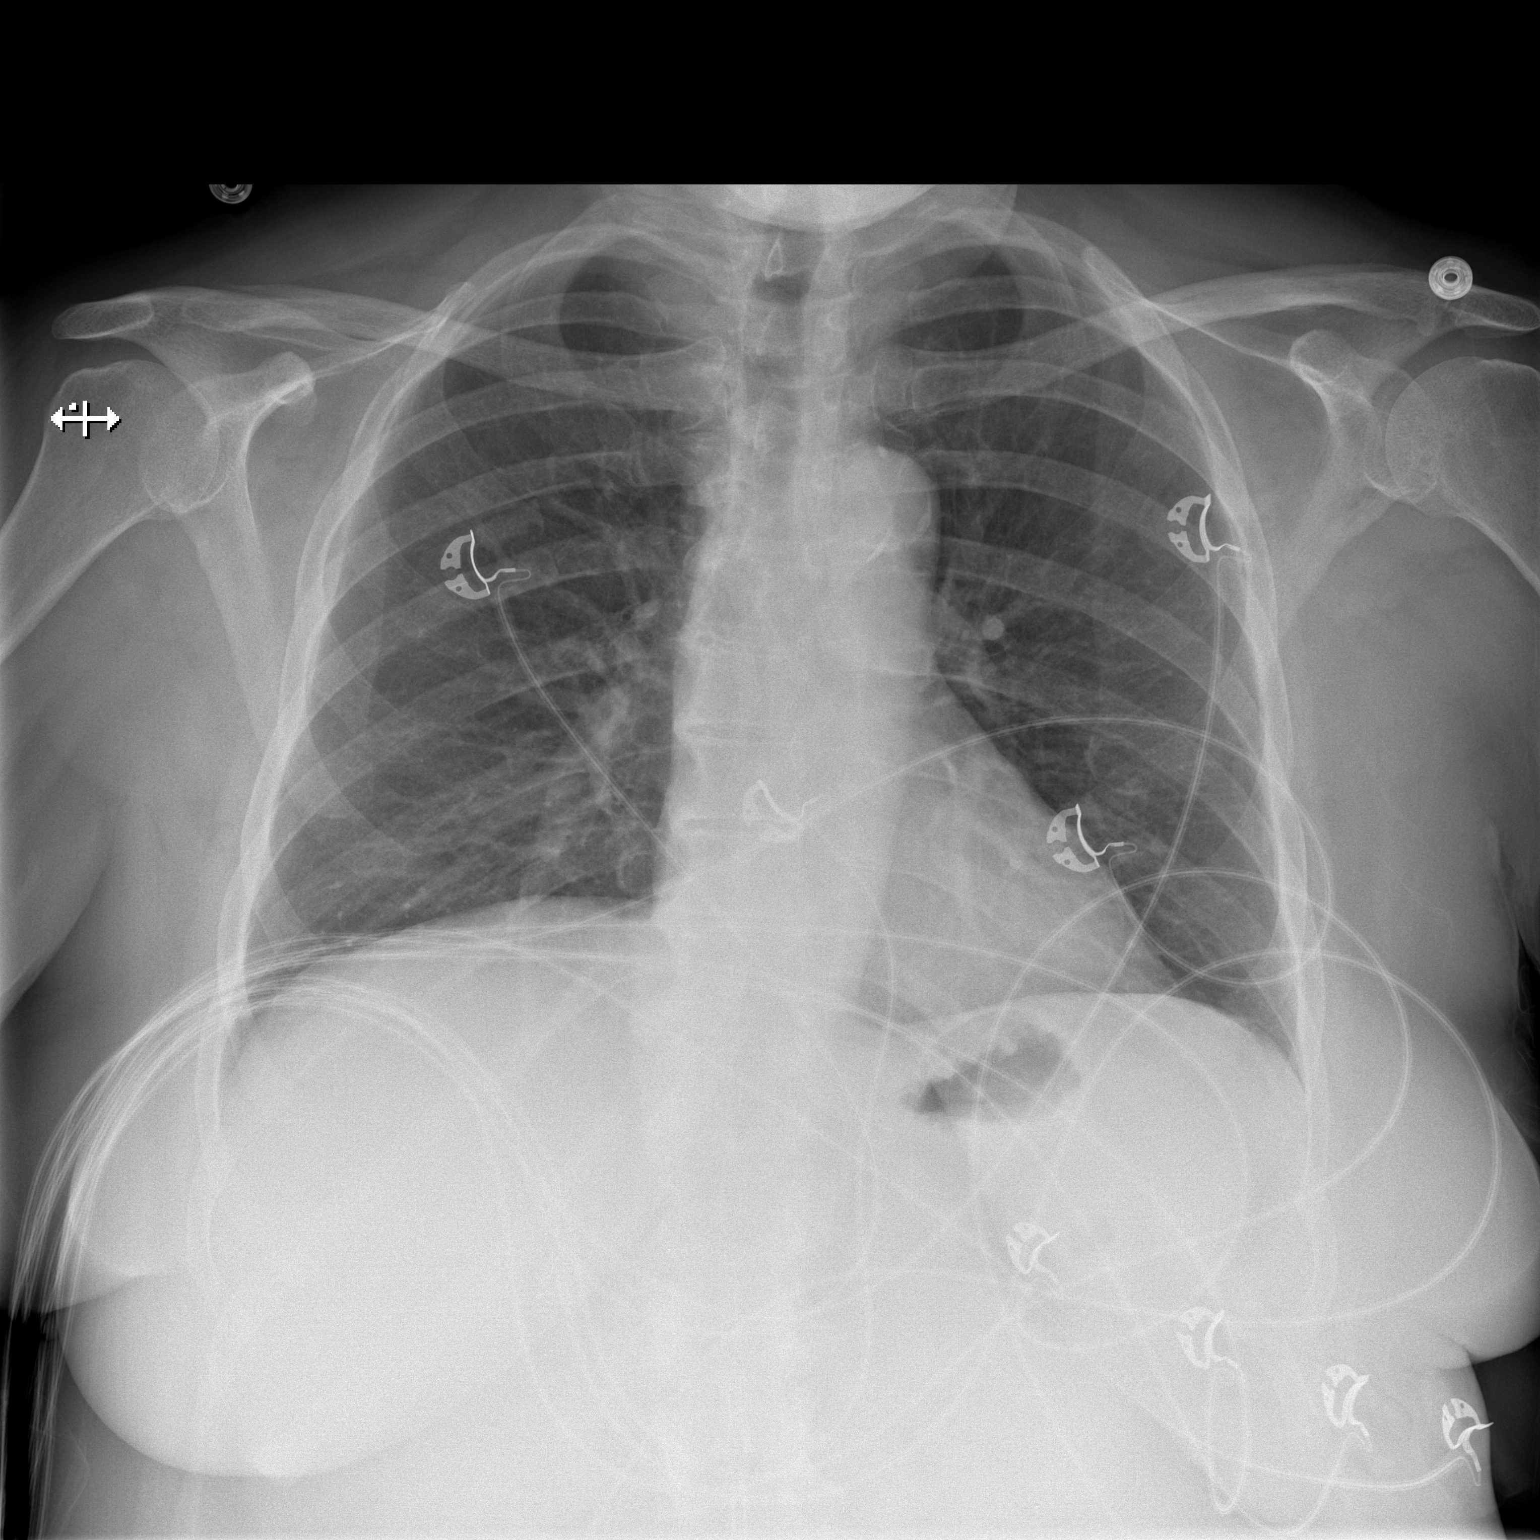

[w chest lat]
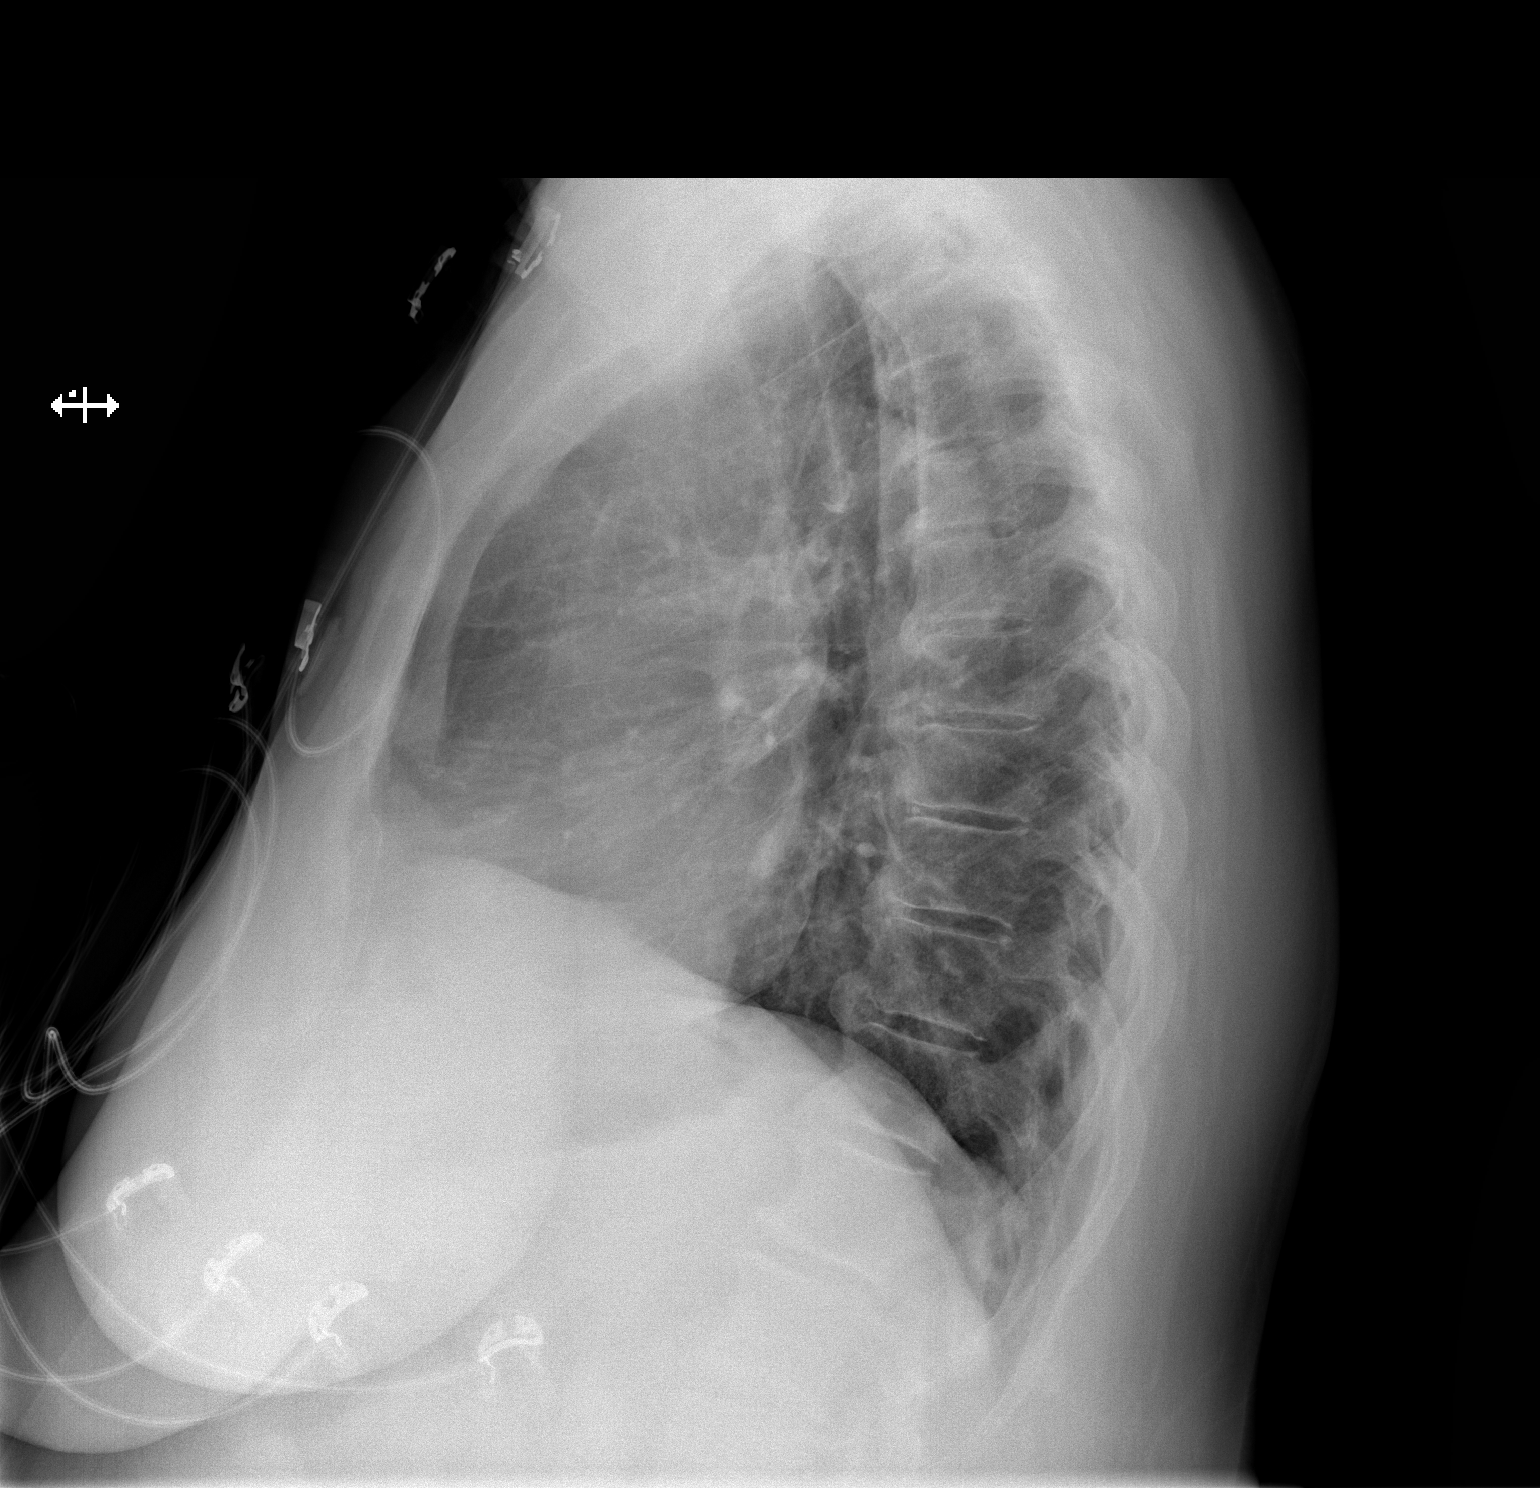

[2 of 2 positions shown; findings below may reference images not displayed]

FINDINGS: The cardiac silhouette, mediastinal and hilar contours are within
normal limits. Mild tortuosity and calcification of the thoracic
aorta is noted.

The lungs are clear. No pleural effusions. The bony thorax is
intact.
IMPRESSION: No acute cardiopulmonary findings.

## 2020-10-13 IMAGING — CT CT ABD-PELV W/O CM
2 of 4 series · 16 of 46 positions shown, 18 images · non-contrast
Comparison: 07/06/2019

CLINICAL DATA: Diarrhea with epigastric abdominal pain

EXAM:
CT ABDOMEN AND PELVIS WITHOUT CONTRAST
TECHNIQUE: Multidetector CT imaging of the abdomen and pelvis was performed
following the standard protocol without IV contrast.

[Series 2: axial st · axial · 0.82mm/px · z∈[-431,-21]mm · 13 of 94 slices shown, 15 images]
[im 6/94  soft-tissue]
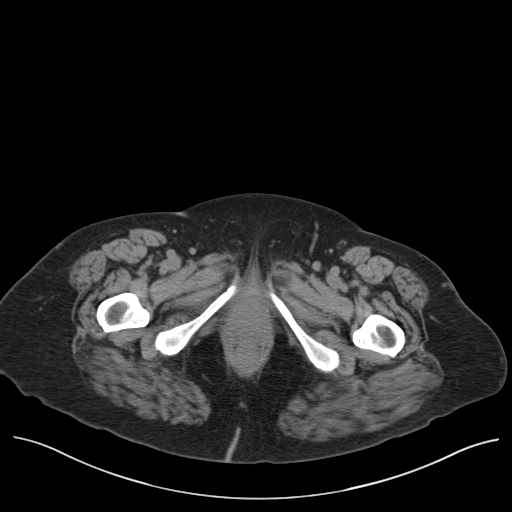
[im 6/94  bone]
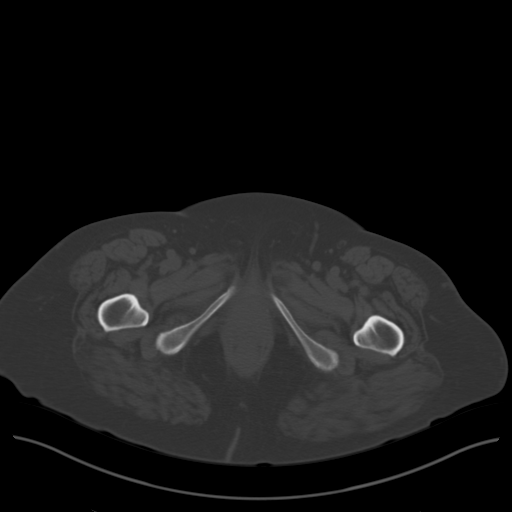
[im 11/94  soft-tissue]
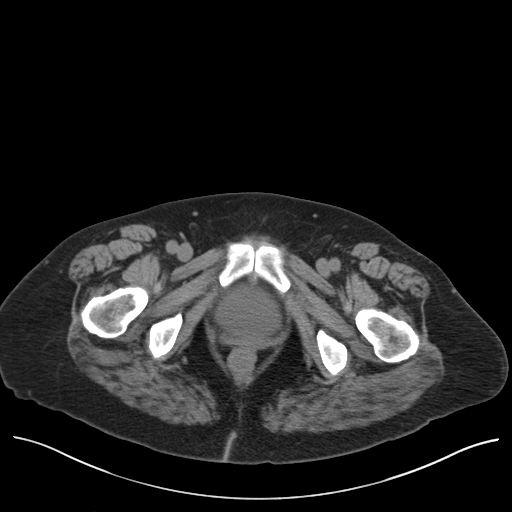
[im 22/94  soft-tissue]
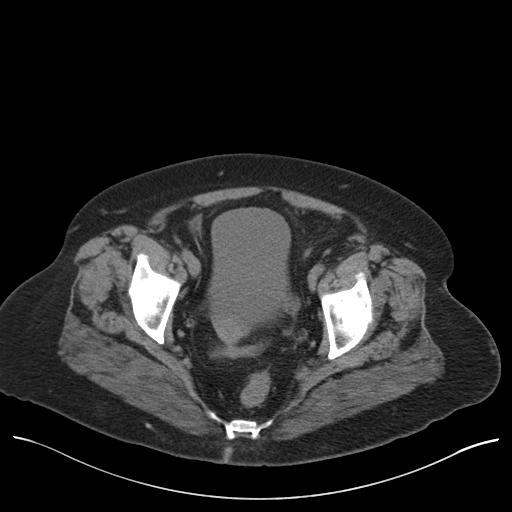
[im 28/94  soft-tissue]
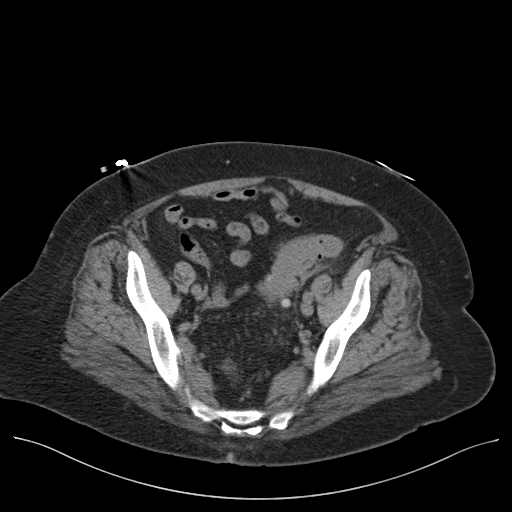
[im 33/94  soft-tissue]
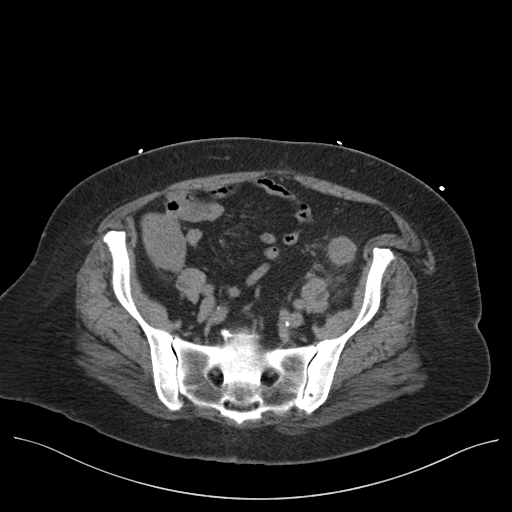
[im 39/94  soft-tissue]
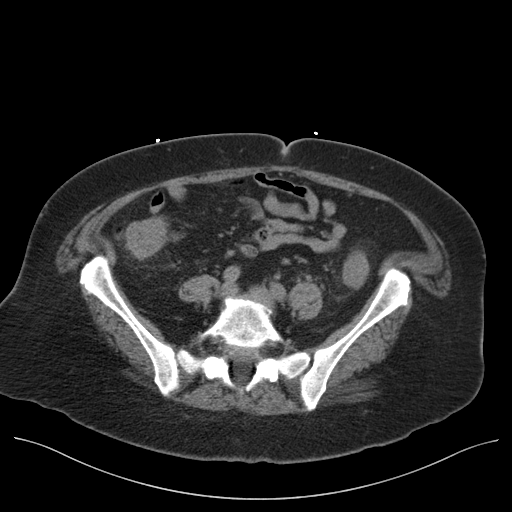
[im 50/94  soft-tissue]
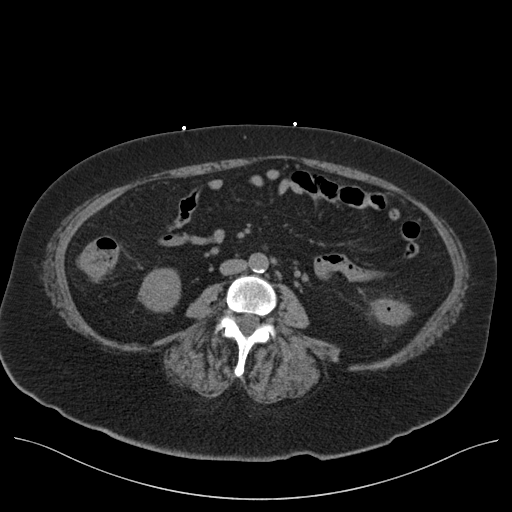
[im 55/94  soft-tissue]
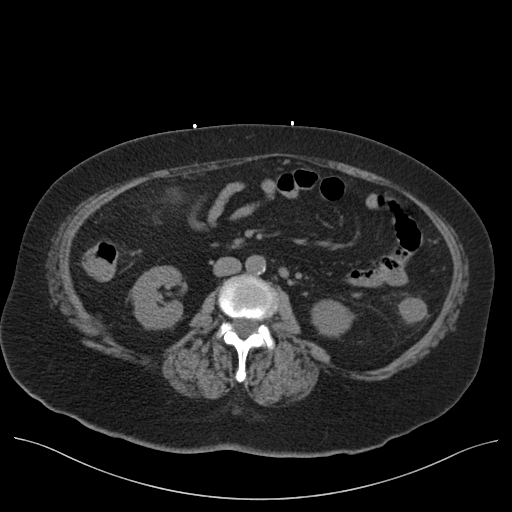
[im 61/94  soft-tissue]
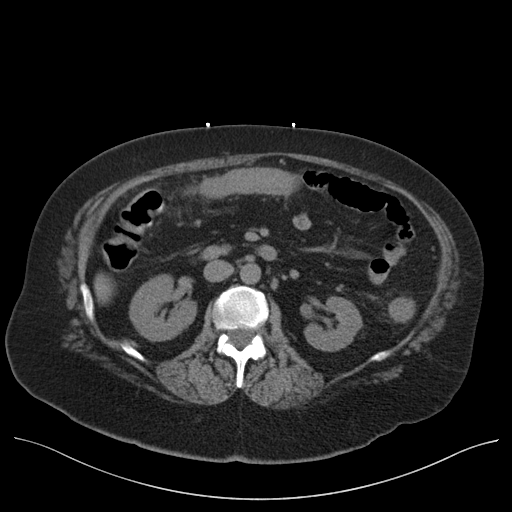
[im 61/94  bone]
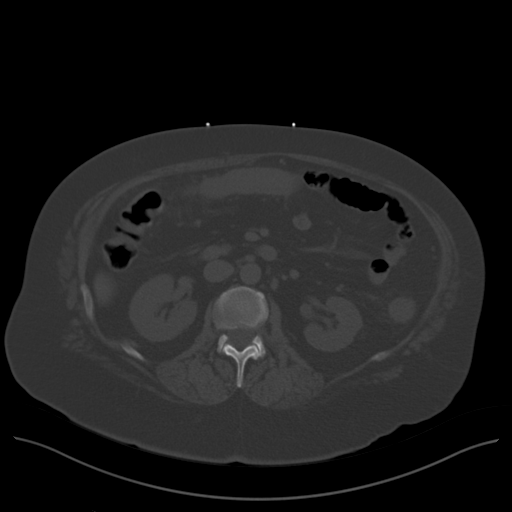
[im 66/94  soft-tissue]
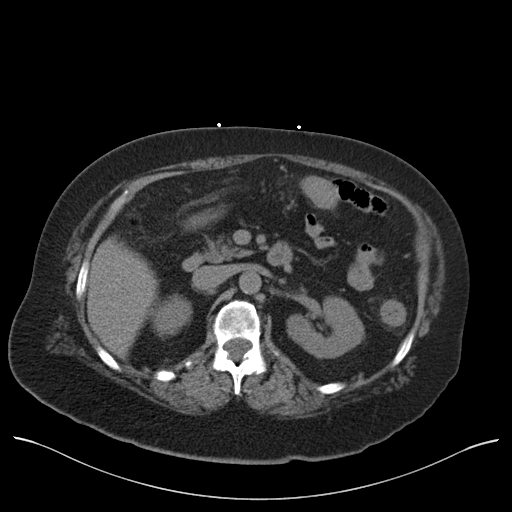
[im 72/94  soft-tissue]
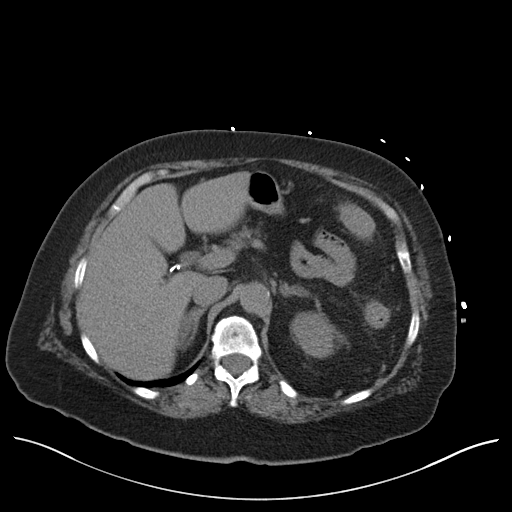
[im 83/94  soft-tissue]
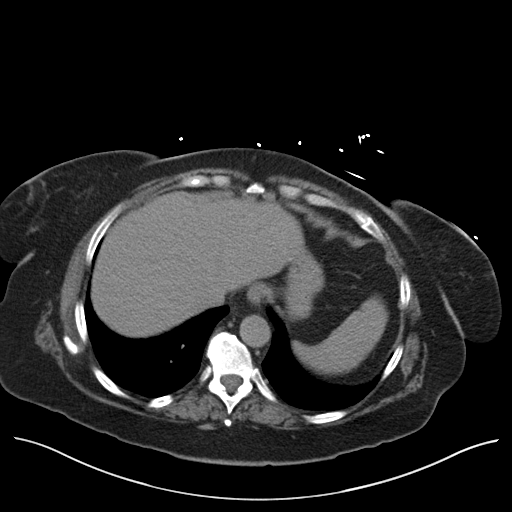
[im 88/94  soft-tissue]
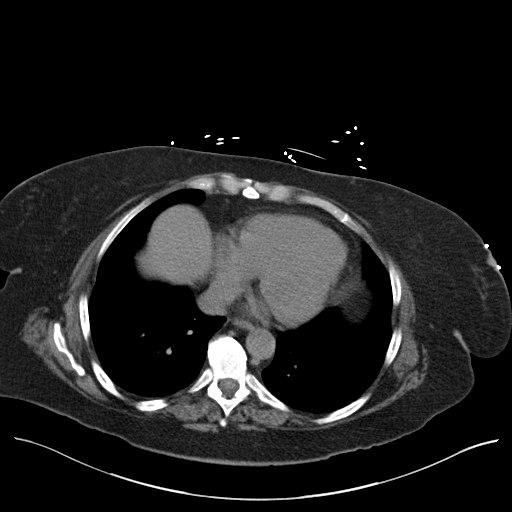

[Series 4: coronal st · coronal · 0.96mm/px · 3 of 123 slices shown]
[im 41/123  soft-tissue]
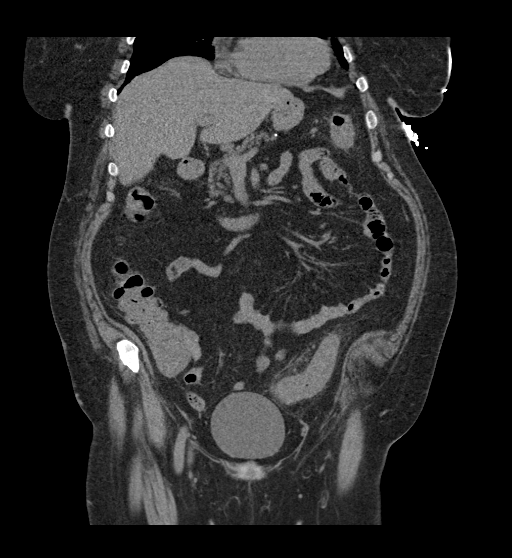
[im 55/123  soft-tissue]
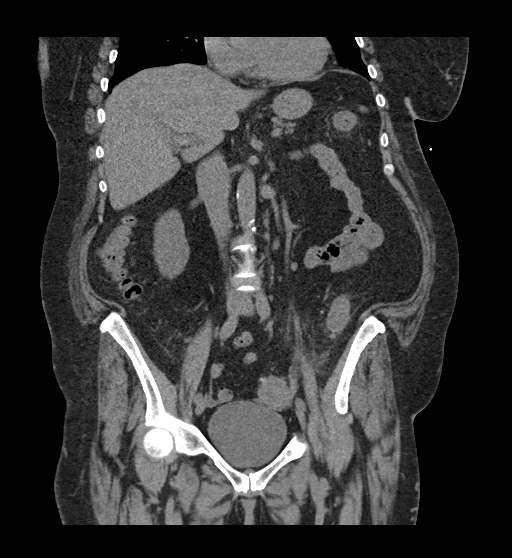
[im 68/123  soft-tissue]
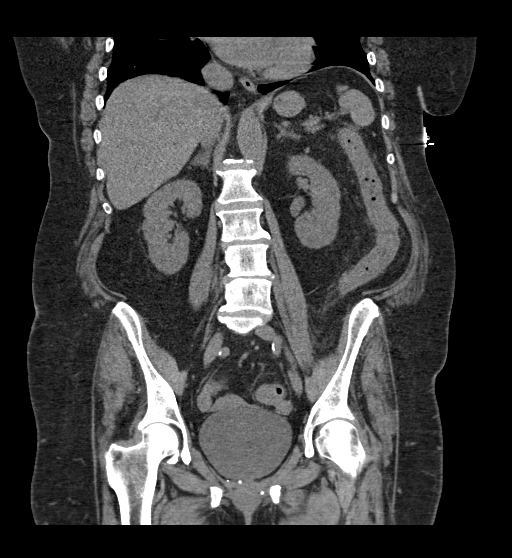

[16 of 46 positions shown; findings below may reference images not displayed]

FINDINGS: Lower chest: No consolidation.  No pleural effusion.

Hepatobiliary: Post cholecystectomy. Liver normal in size and
contour.

Pancreas: Normal contour, no inflammatory changes about the
pancreas.

Spleen: Spleen normal in size and contour.

Adrenals/Urinary Tract: Adrenal glands are normal.

Kidneys with smooth renal contours. No hydronephrosis. No
nephrolithiasis.

Stomach/Bowel: Stomach and small bowel are normal. No bowel
obstruction.

Appendix is normal.

Pancolonic thickening and pericolonic stranding. Stranding about the
sigmoid slightly more intense than other areas. Small free fluid in
the pelvis.

No free air.  Trace fluid in the pelvis.  No pneumatosis.

Vascular/Lymphatic: Calcified atheromatous plaque of the abdominal
aorta. No adenopathy.

No pelvic lymphadenopathy.

Reproductive: No adnexal masses. Stranding about the LEFT adnexa
more closely associated with the colon.

Other: Trace free fluid in the pelvis as discussed. No free air. No
abdominal wall hernia.

Musculoskeletal: Spinal degenerative change. No acute bone finding
or destructive bone process.
IMPRESSION: 1. Pancolonic thickening and pericolonic stranding. Stranding about
the sigmoid slightly more intense than other areas. Findings were
compatible with acute diverticulitis on the previous exam now with
superimposed pancolonic inflammation suggestive of infectious
colitis such as C diff colitis in the current context.
2. Aortic atherosclerosis.

Aortic Atherosclerosis (JSST0-E28.8).

## 2020-10-23 ENCOUNTER — Other Ambulatory Visit: Payer: Self-pay | Admitting: Orthopedic Surgery

## 2020-10-23 DIAGNOSIS — S23420A Sprain of sternoclavicular (joint) (ligament), initial encounter: Secondary | ICD-10-CM

## 2020-11-14 ENCOUNTER — Other Ambulatory Visit: Payer: Self-pay

## 2020-11-14 ENCOUNTER — Ambulatory Visit
Admission: RE | Admit: 2020-11-14 | Discharge: 2020-11-14 | Disposition: A | Discharge status: Home / Self Care | Payer: Medicare Other | Source: Ambulatory Visit | Attending: Orthopedic Surgery | Admitting: Orthopedic Surgery

## 2020-11-14 DIAGNOSIS — S23420A Sprain of sternoclavicular (joint) (ligament), initial encounter: Secondary | ICD-10-CM

## 2021-01-01 ENCOUNTER — Ambulatory Visit (INDEPENDENT_AMBULATORY_CARE_PROVIDER_SITE_OTHER): Payer: Medicare Other | Admitting: Internal Medicine

## 2021-01-01 ENCOUNTER — Other Ambulatory Visit: Payer: Self-pay

## 2021-01-01 ENCOUNTER — Encounter: Payer: Self-pay | Admitting: Internal Medicine

## 2021-01-01 VITALS — BP 128/80 | HR 76 | Ht 65.0 in

## 2021-01-01 DIAGNOSIS — Z794 Long term (current) use of insulin: Secondary | ICD-10-CM

## 2021-01-01 DIAGNOSIS — E1165 Type 2 diabetes mellitus with hyperglycemia: Secondary | ICD-10-CM | POA: Diagnosis not present

## 2021-01-01 DIAGNOSIS — E785 Hyperlipidemia, unspecified: Secondary | ICD-10-CM | POA: Diagnosis not present

## 2021-01-01 LAB — POCT GLYCOSYLATED HEMOGLOBIN (HGB A1C): Hemoglobin A1C: 10.4 % — AB (ref 4.0–5.6)

## 2021-01-01 NOTE — Patient Instructions (Addendum)
Please start Ozempic 0.25 mg weekly in a.m. (for example on Sunday morning) x 2-4 weeks, then increase to 0.5 mg weekly in a.m. if no nausea or hypoglycemia.  Please continue: Insulin Before breakfast Before lunch Before dinner  Regular 25-30 10-12 25-30  NPH 30  30  Please inject the insulin 30 min before meals.  Please return in 1.5 months with your sugar log.

## 2021-01-01 NOTE — Progress Notes (Signed)
Patient ID: [name], female   DOB: [birth year], 70 y.o.   MRN: [id number]   This visit occurred during the SARS-CoV-2 public health emergency.  Safety protocols were in place, including screening questions prior to the visit, additional usage of staff PPE, and extensive cleaning of exam room while observing appropriate contact time as indicated for disinfecting solutions.   HPI: [name] is a 70 y.o.-year-old female, returning for f/u for DM2, dx in 2006-2007, insulin-dependent since 2017, uncontrolled, without long term complications.  She saw Dr. Buddy Duty in the past.  Last visit with me 3.5 months ago. Started to go to [facility]. She will switch to [employer] in 02/2021.  Interim history: She had another diverticulitis episode since last visit-in ED 10/12/2020.  Lipase was normal. She continues to have joint pains.  She continues to have cervical spine steroid injections.  Sugars are higher after these (up to 400s), but she feels that these are helping.  Reviewed HbA1c levels: Lab Results  Component Value Date   HGBA1C 10.4 (A) 09/19/2020   HGBA1C 10.0 (A) 05/01/2020   HGBA1C 9.0 (H) 08/02/2019  12/09/2016: HbA1c 10.7% 07/10/2016: HbA1c 9.6%  Pt was on a regimen of: - NPH 25 units at bedtime - Actos 15 mg in a.m.  She is now on: - - tried again to start 09/2020 but could not b/c $$$ Insulin Before breakfast Before lunch Before dinner  Regular 25-30 10-12 (not taking it at work - 3x a wek) 25-30  NPH 30  30   She has tried: Metformin >> stomach cramps, diarrhea Invokana >> dry mouth, rash on face Basaglar >> back muscle spasms, congestion She could not afford Trulicity. We stopped Actos 10/2017  Pt checks her sugars 1-2 times a day: - am: 87-251, 308 >> 149-275, 292 >> 137-382 >> 105-262, 375 (forgot insulin), 450 (steroids) - 2h after b'fast: 140-205, 244  >> 190 >> 184, 244 >> n/c  - before lunch:73, 99-166, 176 >> n/c >> 69-210 >> n/c >> 107 >> 71,  165 - 2h after lunch: n/c  - snack - before dinner: n/c >> 113, 180-317, 346 >> 197-313 >> n/c - 2h after dinner: n/c >> 129, 189 >> n/c >> 248 >> n/c >> 209 >> n/c - bedtime:  95, 127-145 >> n/c >> 253 >> n/c >> 238 >> n/c - nighttime: n/c >> 66 >> n/c >> 60 at night Lowest sugar was  113 >> 69 >> 113 >> 107 >> 60 (night, light dinner) 71 (running errands); it is unclear at which level she has hypoglycemia awareness Highest sugar was  346 >> 538, 580 (steroid) >> 450.  Glucometer: Accu-Chek  Pt's meals are: - Breakfast: oatmeal, Kashi cereal, English muffin, whole wheat bread - Lunch: sandwich, salad - Dinner: (late, 8 pm): veggies, meat or pasta or eggs + toast (largest) - Snacks: none + Coke 0, tea + Splenda  -No CKD last BUN/creatinine:  Lab Results  Component Value Date   BUN 24 (H) 10/12/2020   BUN 28 (H) 08/09/2020   CREATININE 0.84 10/12/2020   CREATININE 1.06 (H) 08/09/2020  07/10/2016: ACR 5.5  -+ HL last set of lipids: 06/01/2019: 273/165/52/151 01/15/2019:206/221/50/118 01/06/2018: 204/115/53/129 12/10/2016: 226/113/57/147. No results found for: CHOL, HDL, LDLCALC, LDLDIRECT, TRIG, CHOLHDL  She is not on a statin.  Lovastatin, rosuvastatin, and atorvastatin and other 3 statins caused muscle aches.   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 [name] - Dr. Jerline Pain.  -  No numbness and tingling in her feet She is on Neurontin 100 mg 3 times a day but for trigeminal neuralgia.  On ASA 81.  Unclear FH of DM -patient is adopted.  No h/o pancreatitis.  She also has a history of HTN, fibromyalgia, vitamin D deficiency, PCOS, fatty liver, kidney stones, trigeminal neuralgia, migraines.  ROS: + See HPI  I reviewed pt's medications, allergies, PMH, social hx, family hx, and changes were documented in the history of present illness. Otherwise, unchanged from my initial visit note.  Past Medical History:  Diagnosis Date   Diabetes mellitus     Diverticulitis    Fibromyalgia    Hypercholesteremia    Hypertension    IBS (irritable bowel syndrome)    Migraines    Nasal fracture    Stein-Leventhal ovaries    Past Surgical History:  Procedure Laterality Date   CHOLECYSTECTOMY  1998   KNEE SURGERY Right 2017   LEFT HEART CATHETERIZATION WITH CORONARY ANGIOGRAM N/A 12/19/2011   Procedure: LEFT HEART CATHETERIZATION WITH CORONARY ANGIOGRAM;  Surgeon: Candee Furbish, MD;  Location: [facility] CATH LAB;  Service: Cardiovascular;  Laterality: N/A;   SHOULDER SURGERY     TONSILLECTOMY  1959   Social History   Socioeconomic History   Marital status: Single    Spouse name: Not on file   Number of children: 0   Years of education: Some college   Highest education level: Not on file  Occupational History   Occupation: accounting    Comment: [employer]  Tobacco Use   Smoking status: Never   Smokeless tobacco: Never  Vaping Use   Vaping Use: Never used  Substance and Sexual Activity   Alcohol use: Yes    Comment: rare   Drug use: No   Sexual activity: Not on file  Other Topics Concern   Not on file  Social History Narrative   Lives alone w/ her 3 cats   Right-handed   Caffeine: 2 cups coffee/tea per day   Social Determinants of Health   Financial Resource Strain: Not on file  Food Insecurity: Not on file  Transportation Needs: Not on file  Physical Activity: Not on file  Stress: Not on file  Social Connections: Not on file  Intimate Partner Violence: Not on file   Current Outpatient Medications on File Prior to Visit  Medication Sig Dispense Refill   ACCU-CHEK SMARTVIEW test strip USE AS INSTRUCTED TO TEST TWICE DAILY 100 each 4   acetaminophen (TYLENOL) 650 MG CR tablet Take 650 mg by mouth every 8 (eight) hours as needed for pain.     acetaminophen-codeine (TYLENOL #3) 300-30 MG per tablet Take 1 tablet by mouth 2 (two) times daily as needed for severe pain.      cholecalciferol (VITAMIN D) 1000 UNITS tablet Take  1,000 Units by mouth every evening.      ciprofloxacin (CIPRO) 500 MG tablet Take 1 tablet (500 mg total) by mouth every 12 (twelve) hours. 14 tablet 0   fluticasone (FLONASE) 50 MCG/ACT nasal spray Place 1 spray into both nostrils daily.     hydrochlorothiazide (HYDRODIURIL) 25 MG tablet Take 12.5 mg by mouth daily.     HYDROcodone-acetaminophen (NORCO) 7.5-325 MG tablet Take 1 tablet by mouth every 6 (six) hours as needed (Migraine headaches).      insulin NPH Human (NOVOLIN N) 100 UNIT/ML injection Inject 18 units in am and 25 units in pm (Patient taking differently: Inject 20 Units into the skin 2 (two) times  daily before a meal.) 10 mL 11   insulin regular (NOVOLIN R RELION) 100 units/mL injection Inject 0.08-0.1 mLs (8-10 Units total) into the skin 2 (two) times daily before a meal. (Patient taking differently: Inject 20 Units into the skin 2 (two) times daily before a meal.) 10 mL 11   Lancets (ACCU-CHEK MULTICLIX) lancets Use as instructed to test once daily DX E11.65 100 each 4   metoprolol (LOPRESSOR) 50 MG tablet Take 25 mg by mouth 2 (two) times daily.  0   metroNIDAZOLE (FLAGYL) 500 MG tablet Take 1 tablet (500 mg total) by mouth 2 (two) times daily. 14 tablet 0   ondansetron (ZOFRAN ODT) 4 MG disintegrating tablet 4mg  ODT q4 hours prn nausea/vomit 10 tablet 0   polyvinyl alcohol (LIQUIFILM TEARS) 1.4 % ophthalmic solution Place 1 drop into both eyes every 6 (six) hours as needed for dry eyes.     PRESCRIPTION MEDICATION Patient receives a Steroid injection in the neck once a year with Dr. Mina Marble at [facility] pt is unable to recall the exact name and i have been unable to verify name from the Dr's office. Patient also states " it shoots up my blood sugars really high, but i still get it because i can't take anything else for my neck pain"     Semaglutide,0.25 or 0.5MG /DOS, (OZEMPIC, 0.25 OR 0.5 MG/DOSE,) 2 MG/1.5ML SOPN Inject 0.5 mg into the skin once a week. 4.5 mL 5    [DISCONTINUED] gabapentin (NEURONTIN) 100 MG capsule Take 1 capsule (100 mg total) by mouth 3 (three) times daily. (Patient not taking: No sig reported) 90 capsule 12   Current Facility-Administered Medications on File Prior to Visit  Medication Dose Route Frequency Provider Last Rate Last Admin   methylPREDNISolone sodium succinate (SOLU-MEDROL) 130 mg in sodium chloride 0.9 % 50 mL IVPB  130 mg Intravenous Once Jerline Pain, MD       Allergies  Allergen Reactions   Contrast Media [Iodinated Diagnostic Agents] Hives   Erythromycin Anaphylaxis   Rocephin [Ceftriaxone] Hives and Itching   Statins Other (See Comments)    Severe muscle pain/weakness   Augmentin [Amoxicillin-Pot Clavulanate] Nausea And Vomiting    Nausea, vomiting, headache, dizziness. Hives on 08/2019   Basaglar Kwikpen [Insulin Glargine]     Severe ankle swelling   Metformin And Related Diarrhea and Nausea And Vomiting    Violently ill for 2 yrs   Nsaids Other (See Comments)    Stomach problems   Prednisone Other (See Comments)    Abnormal blood sugars.Elyse Hsu [Shellfish Allergy] Diarrhea and Nausea And Vomiting    Severe GI upset   Sulfa Antibiotics Other (See Comments)    blackout   Invokana [Canagliflozin] Rash and Other (See Comments)    Dehydration   Tramadol Rash   Family History  Adopted: Yes  Problem Relation Age of Onset   Failure to thrive Mother    Heart failure Father    Neuropathy Neg Hx    PE: BP 128/80 (BP Location: Left Arm, Patient Position: Sitting, Cuff Size: Normal)   Pulse 76   Ht 5\' 5"  (1.651 m)   SpO2 99%   BMI 31.28 kg/m  Refused being weighed.  Wt Readings from Last 3 Encounters:  10/12/20 188 lb (85.3 kg)  08/02/19 192 lb 0.3 oz (87.1 kg)  07/06/19 192 lb (87.1 kg)   Constitutional: overweight, in NAD Eyes: PERRLA, EOMI, no exophthalmos ENT: moist mucous membranes, no thyromegaly, no cervical lymphadenopathy  Cardiovascular: RRR, No MRG. + R>L periankle  swelling Respiratory: CTA B Gastrointestinal: abdomen soft, NT, ND, BS+ Musculoskeletal: no deformities, strength intact in all 4 Skin: moist, warm, no rashes Neurological: no tremor with outstretched hands, DTR normal in all 4  ASSESSMENT: 1. DM2, insulin-dependent, uncontrolled, without long-term complications, but with hyperglycemia  + hypertensive retinopathy  No known family history of medullary thyroid cancer (she is adopted).  No personal history of pancreatitis.  2. HL  PLAN:  1. Patient with longstanding, uncontrolled, type 2 diabetes, on basal-bolus insulin regimen with NPH and regular insulin due to price.  Also, she could not tolerate Basaglar in the past and could not afford the GLP-1 receptor agonist.  In 2019, we stopped Actos due to improved blood sugars but also significant weight gain and lower extremity edema.  She did lose weight afterwards and her edema decreased.  Her diabetes control is limited by her erratic schedule and forgetting insulin doses.  At last visit, we increased her insulin doses but I also suggested to try again to add a GLP-1 receptor agonist.  Trulicity was not affordable in the past and she did not qualify for patient assistance due to the fact that she was working.  At last visit I printed her a prescription for Ozempic to check with the pharmacist and see if this is affordable.  We also discussed about Mounjaro, but I doubted that this would be covered by insurance.  HbA1c at last visit was higher, at 10.4%. -At this visit, reviewing her blood sugar logs, she still only checks in the morning, with very few blood sugar checks later in the day.  Therefore, it is very difficult to make adjustments in her regimen.  In the morning, sugars are fluctuating, but mostly in the 200s.  She has occasional blood sugars in the 300s when she forgets to take the insulin the night before and 400s after steroid injections.  During the night, she had 1 low blood sugar at 60  and after running.  She had 1 low blood sugar at 71.  Therefore, I am not comfortable increasing her insulin doses especially without more blood sugar checks later in the day.  Unfortunately, she was not able to start Ozempic since last visit since this was more than 400s at the pharmacy.  However, she plans to change her insurance to Medicare advantage after the first of the year.  At that time, she was told that [place] would be covered with a preauthorization.  Therefore, at this visit, we gave her an Ozempic pen and discussed about how to start and to titrate the dose upward as tolerated.  Discussed about benefits and possible side effects.  She has no personal history of pancreatitis. - I suggested to:  Patient Instructions  Please start Ozempic 0.25 mg weekly in a.m. (for example on Sunday morning) x 2-4 weeks, then increase to 0.5 mg weekly in a.m. if no nausea or hypoglycemia.  Please continue: Insulin Before breakfast Before lunch Before dinner  Regular 25-30 10-12 25-30  NPH 30  30  Please inject the insulin 30 min before meals.  Please return in 1.5 months with your sugar log.   - we checked her HbA1c: 10.4% (still very high) - advised to check sugars at different times of the day - 4x a day, rotating check times - advised for yearly eye exams >> she is UTD - return to clinic in 1.5 months - at that time, she will return after she  changes insurance and I advised her to bring the formulary with her.   2. HL -Reviewed latest lipid panel from 05/2019:233/165/52/151: LDL much higher than target, triglycerides only slightly high -She is not on a statin due to previous intolerance to 6 of them reportedly -She prefers to have another lipid panel checked by PCP.  If not checked before next visit, will check at that time.  Philemon Kingdom, MD PhD [facility]

## 2021-02-19 ENCOUNTER — Encounter: Payer: Self-pay | Admitting: *Deleted

## 2021-02-19 ENCOUNTER — Other Ambulatory Visit: Payer: Self-pay

## 2021-02-19 ENCOUNTER — Ambulatory Visit: Payer: PPO | Admitting: Internal Medicine

## 2021-02-19 ENCOUNTER — Encounter: Payer: Self-pay | Admitting: Internal Medicine

## 2021-02-19 VITALS — BP 128/58 | HR 76

## 2021-02-19 DIAGNOSIS — E1165 Type 2 diabetes mellitus with hyperglycemia: Secondary | ICD-10-CM

## 2021-02-19 DIAGNOSIS — Z794 Long term (current) use of insulin: Secondary | ICD-10-CM | POA: Diagnosis not present

## 2021-02-19 DIAGNOSIS — E785 Hyperlipidemia, unspecified: Secondary | ICD-10-CM | POA: Diagnosis not present

## 2021-02-19 MED ORDER — INSULIN NPH (HUMAN) (ISOPHANE) 100 UNIT/ML ~~LOC~~ SUSP: 30.0000 [IU] | Freq: Two times a day (BID) | SUBCUTANEOUS | 3 refills | Status: DC

## 2021-02-19 MED ORDER — OZEMPIC (0.25 OR 0.5 MG/DOSE) 2 MG/1.5ML ~~LOC~~ SOPN: 0.5000 mg | PEN_INJECTOR | SUBCUTANEOUS | 3 refills | Status: DC

## 2021-02-19 MED ORDER — INSULIN REGULAR HUMAN 100 UNIT/ML IJ SOLN: 10.0000 [IU] | Freq: Three times a day (TID) | INTRAMUSCULAR | 3 refills | Status: DC

## 2021-02-19 NOTE — Patient Instructions (Addendum)
Please increase: - Ozempic 0.5 mg weekly  Take regularly: Insulin Before breakfast Before lunch Before dinner  Regular 25-30 10-12 25-30  NPH 30  30  Please inject the insulin 30 min before meals.  Please return in 2-3 months with your sugar log.

## 2021-02-19 NOTE — Progress Notes (Signed)
Patient ID: [name], female   DOB: August 22, 1950, 71 y.o.   MRN: [id number]   This visit occurred during the SARS-CoV-2 public health emergency.  Safety protocols were in place, including screening questions prior to the visit, additional usage of staff PPE, and extensive cleaning of exam room while observing appropriate contact time as indicated for disinfecting solutions.   HPI: [name] is a 71 y.o.-year-old female, returning for f/u for DM2, dx in 2006-2007, insulin-dependent since 2017, uncontrolled, without long term complications.  She saw Dr. Buddy Duty in the past.  Last visit with me 1.5 months ago. Started to go to [facility]. She switched to [facility] in 02/2021.  Interim history: She continues to have joint pains.  She also has cervical spine steroid injections.  Sugars are usually high after these.  However, she feels that these are helping. Last 01/09/2021. No increased urination, blurry vision, nausea, chest pain.  She does have abdominal pain, though, thought to be related to her previous diverticulitis episodes.  This did not feel better when she was off Ozempic. She was off Ozempic x1 since last OV when she traveled to [place] to see her family.  Reviewed HbA1c levels: Lab Results  Component Value Date   HGBA1C 10.4 (A) 01/01/2021   HGBA1C 10.4 (A) 09/19/2020   HGBA1C 10.0 (A) 05/01/2020  12/09/2016: HbA1c 10.7% 07/10/2016: HbA1c 9.6%  Pt was on a regimen of: - NPH 25 units at bedtime - Actos 15 mg in a.m.  She is now on: - Ozempic 0.25 >> 0.5 mg weekly - started 12/2020 Insulin Before breakfast Before lunch Before dinner  Regular 25-30 10-12 (not taking it at work - 3x a week) 25-30  NPH 30  30   She has tried: Metformin >> stomach cramps, diarrhea Invokana >> dry mouth, rash on face Basaglar >> back muscle spasms, congestion She could not afford Trulicity. We stopped Actos 10/2017 >> Leg swelling improved.  Pt checks her sugars 1-2 times a day: - am:  105-262, 375 (forgot insulin), 450 (steroids) >> 70-315, in last 2 weeks:96-256 - 2h after b'fast: 140-205, 244  >> 190 >> 184, 244 >> n/c >> 326 - before lunch: 69-210 >> n/c >> 107 >> 71, 165 >> 77 - 2h after lunch: n/c  - snack - before dinner: 113, 180-317, 346 >> 197-313 >> n/c >> 186-312 - 2h after dinner:  n/c >> 248 >> n/c >> 209 >> n/c >> 175 - bedtime:  95, 127-145 >> n/c >> 253 >> n/c >> 238 >> n/c - nighttime: n/c >> 66 >> n/c >> 60 at night >> 83  Lowest sugar was  60 (night, light dinner) 71 (running errands) >> 61; it is unclear at which level she has hypoglycemia awareness Highest sugar was 538, 580 (steroid) >> 450 >>  527 (steroids).  Glucometer: Accu-Chek  Pt's meals are: - Breakfast: oatmeal, Kashi cereal, English muffin, whole wheat bread - Lunch: sandwich, salad - Dinner: (late, 8 pm): veggies, meat or pasta or eggs + toast (largest) - Snacks: none + Coke 0, tea + Splenda  -No CKD last BUN/creatinine:  Lab Results  Component Value Date   BUN 24 (H) 10/12/2020   BUN 28 (H) 08/09/2020   CREATININE 0.84 10/12/2020   CREATININE 1.06 (H) 08/09/2020  07/10/2016: ACR 5.5  -+ HL last set of lipids: 06/01/2019: 273/165/52/151 01/15/2019:206/221/50/118 01/06/2018: 204/115/53/129 12/10/2016: 226/113/57/147. No results found for: CHOL, HDL, LDLCALC, LDLDIRECT, TRIG, CHOLHDL  She is not on a statin.  Lovastatin, rosuvastatin, and atorvastatin and other 3 statins caused muscle aches.   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 [name] - Dr. Jerline Pain.  -No numbness and tingling in her feet She is on Neurontin 100 mg 3 times a day but for trigeminal neuralgia.  On ASA 81.  Unclear FH of DM -patient is adopted.  She also has a history of HTN, fibromyalgia, vitamin D deficiency, PCOS, fatty liver, kidney stones, trigeminal neuralgia, migraines.  ROS: + See HPI  I reviewed pt's medications, allergies, PMH, social hx, family hx, and changes were  documented in the history of present illness. Otherwise, unchanged from my initial visit note.  Past Medical History:  Diagnosis Date   Diabetes mellitus    Diverticulitis    Fibromyalgia    Hypercholesteremia    Hypertension    IBS (irritable bowel syndrome)    Migraines    Nasal fracture    Stein-Leventhal ovaries    Past Surgical History:  Procedure Laterality Date   CHOLECYSTECTOMY  1998   KNEE SURGERY Right 2017   LEFT HEART CATHETERIZATION WITH CORONARY ANGIOGRAM N/A 12/19/2011   Procedure: LEFT HEART CATHETERIZATION WITH CORONARY ANGIOGRAM;  Surgeon: Candee Furbish, MD;  Location: [facility] CATH LAB;  Service: Cardiovascular;  Laterality: N/A;   SHOULDER SURGERY     TONSILLECTOMY  1959   Social History   Socioeconomic History   Marital status: Single    Spouse name: Not on file   Number of children: 0   Years of education: Some college   Highest education level: Not on file  Occupational History   Occupation: accounting    Comment: [employer]  Tobacco Use   Smoking status: Never   Smokeless tobacco: Never  Vaping Use   Vaping Use: Never used  Substance and Sexual Activity   Alcohol use: Yes    Comment: rare   Drug use: No   Sexual activity: Not on file  Other Topics Concern   Not on file  Social History Narrative   Lives alone w/ her 3 cats   Right-handed   Caffeine: 2 cups coffee/tea per day   Social Determinants of Health   Financial Resource Strain: Not on file  Food Insecurity: Not on file  Transportation Needs: Not on file  Physical Activity: Not on file  Stress: Not on file  Social Connections: Not on file  Intimate Partner Violence: Not on file   Current Outpatient Medications on File Prior to Visit  Medication Sig Dispense Refill   ACCU-CHEK SMARTVIEW test strip USE AS INSTRUCTED TO TEST TWICE DAILY 100 each 4   acetaminophen (TYLENOL) 650 MG CR tablet Take 650 mg by mouth every 8 (eight) hours as needed for pain.     acetaminophen-codeine  (TYLENOL #3) 300-30 MG per tablet Take 1 tablet by mouth 2 (two) times daily as needed for severe pain.      cholecalciferol (VITAMIN D) 1000 UNITS tablet Take 1,000 Units by mouth every evening.      fluticasone (FLONASE) 50 MCG/ACT nasal spray Place 1 spray into both nostrils daily.     hydrochlorothiazide (HYDRODIURIL) 25 MG tablet Take 12.5 mg by mouth daily.     HYDROcodone-acetaminophen (NORCO) 7.5-325 MG tablet Take 1 tablet by mouth every 6 (six) hours as needed (Migraine headaches).      insulin NPH Human (NOVOLIN N) 100 UNIT/ML injection Inject 18 units in am and 25 units in pm (Patient taking differently: Inject 20 Units into the skin  2 (two) times daily before a meal.) 10 mL 11   insulin regular (NOVOLIN R RELION) 100 units/mL injection Inject 0.08-0.1 mLs (8-10 Units total) into the skin 2 (two) times daily before a meal. (Patient taking differently: Inject 20 Units into the skin 2 (two) times daily before a meal.) 10 mL 11   Lancets (ACCU-CHEK MULTICLIX) lancets Use as instructed to test once daily DX E11.65 100 each 4   metoprolol (LOPRESSOR) 50 MG tablet Take 25 mg by mouth 2 (two) times daily.  0   metroNIDAZOLE (FLAGYL) 500 MG tablet Take 1 tablet (500 mg total) by mouth 2 (two) times daily. 14 tablet 0   ondansetron (ZOFRAN ODT) 4 MG disintegrating tablet 4mg  ODT q4 hours prn nausea/vomit 10 tablet 0   polyvinyl alcohol (LIQUIFILM TEARS) 1.4 % ophthalmic solution Place 1 drop into both eyes every 6 (six) hours as needed for dry eyes.     PRESCRIPTION MEDICATION Patient receives a Steroid injection in the neck once a year with Dr. Mina Marble at [facility] pt is unable to recall the exact name and i have been unable to verify name from the Dr's office. Patient also states " it shoots up my blood sugars really high, but i still get it because i can't take anything else for my neck pain"     [DISCONTINUED] gabapentin (NEURONTIN) 100 MG capsule Take 1 capsule (100 mg total) by mouth 3  (three) times daily. (Patient not taking: No sig reported) 90 capsule 12   Current Facility-Administered Medications on File Prior to Visit  Medication Dose Route Frequency Provider Last Rate Last Admin   methylPREDNISolone sodium succinate (SOLU-MEDROL) 130 mg in sodium chloride 0.9 % 50 mL IVPB  130 mg Intravenous Once Jerline Pain, MD       Allergies  Allergen Reactions   Contrast Media [Iodinated Contrast Media] Hives   Erythromycin Anaphylaxis   Rocephin [Ceftriaxone] Hives and Itching   Statins Other (See Comments)    Severe muscle pain/weakness   Augmentin [Amoxicillin-Pot Clavulanate] Nausea And Vomiting    Nausea, vomiting, headache, dizziness. Hives on 08/2019   Basaglar Kwikpen [Insulin Glargine]     Severe ankle swelling   Metformin And Related Diarrhea and Nausea And Vomiting    Violently ill for 2 yrs   Nsaids Other (See Comments)    Stomach problems   Prednisone Other (See Comments)    Abnormal blood sugars.Elyse Hsu [Shellfish Allergy] Diarrhea and Nausea And Vomiting    Severe GI upset   Sulfa Antibiotics Other (See Comments)    blackout   Invokana [Canagliflozin] Rash and Other (See Comments)    Dehydration   Tramadol Rash   Family History  Adopted: Yes  Problem Relation Age of Onset   Failure to thrive Mother    Heart failure Father    Neuropathy Neg Hx    PE: BP (!) 128/58    Pulse 76    SpO2 97%   Refused being weighed.  Wt Readings from Last 3 Encounters:  10/12/20 188 lb (85.3 kg)  08/02/19 192 lb 0.3 oz (87.1 kg)  07/06/19 192 lb (87.1 kg)   Constitutional: overweight, in NAD Eyes: PERRLA, EOMI, no exophthalmos ENT: moist mucous membranes, no thyromegaly, no cervical lymphadenopathy Cardiovascular: RRR, No MRG. + R>L periankle swelling Respiratory: CTA B Musculoskeletal: no deformities, strength intact in all 4 Skin: moist, warm, no rashes Neurological: no tremor with outstretched hands, DTR normal in all 4  ASSESSMENT: 1. DM2,  insulin-dependent, uncontrolled, without long-term complications, but with hyperglycemia  + hypertensive retinopathy  No known family history of medullary thyroid cancer (she is adopted).  No personal history of pancreatitis.  2. HL  PLAN:  1. Patient with longstanding, uncontrolled, type 2 diabetes, on basal-bolus insulin regimen with NPH and regular insulin due to price.  Also, she could not tolerate Basaglar in the past and could not afford a GLP-1 receptor agonist.  At last visit, I suggested to try again to add Ozempic after she changed insurance (she was planning to change to Medicare advantage).  Her diabetes control is limited by her erratic schedule and forgetting insulin doses.  At last visit, she was only checking in the morning, with very few blood sugar checks later in the day so it was difficult to make adjustments in her regimen.  In the morning, sugars are fluctuating, mostly in the 200s, though, with occasional blood sugars in the 300s when she forgot to take insulin the night before and also 400 after steroid injections.  During the night, she had 1 low blood sugar, at 60.  Therefore, we did not change her insulin doses at that time.  HbA1c was still very high, at 10.4%. -At today's visit, sugars remain high, mostly above target with occasional blood sugars at 100 or slightly lower.  Most of the higher blood sugars higher after meals especially when she forgets to take the regular insulin.  Of note, she does not take it with her at work so she does not inject regular insulin before lunch while at work and if she goes out to dinner from work, she also does not have her regular insulin with her.  However, she recently bought an insulin pouch and I strongly advised her to take her insulin with her at work and be ready to inject it if she goes out to eat.  We also discussed about increasing the Ozempic dose from the lowest dose to 0.5 mg weekly.  She has abdominal discomfort/cramps, but she  feels that these are related to her previous diverticulitis episode.  She has constipation for which she takes fiber supplements and Dulcolax. - I suggested to:  Patient Instructions  Please increase: - Ozempic 0.5 mg weekly  Take regularly: Insulin Before breakfast Before lunch Before dinner  Regular 25-30 10-12 25-30  NPH 30  30  Please inject the insulin 30 min before meals.  Please return in 2-3 months with your sugar log.   - advised to check sugars at different times of the day - 4x a day, rotating check times - advised for yearly eye exams >> she is UTD - return to clinic in 2-3  months   2. HL - Reviewed latest lipid panel from 05/2019: 233/165/52/151: LDL much higher than target, TG slightly high -She is not on a statin due to previous intolerance to 6 of them, reportedly -She is due for another lipid panel -has an appointment with a new PCP tomorrow  Philemon Kingdom, MD PhD [facility]

## 2021-02-20 ENCOUNTER — Encounter: Payer: Self-pay | Admitting: Family Medicine

## 2021-02-20 ENCOUNTER — Ambulatory Visit (INDEPENDENT_AMBULATORY_CARE_PROVIDER_SITE_OTHER): Payer: PPO | Admitting: Family Medicine

## 2021-02-20 VITALS — BP 140/80 | HR 81 | Temp 97.5°F | Ht 65.0 in | Wt 190.0 lb

## 2021-02-20 DIAGNOSIS — E782 Mixed hyperlipidemia: Secondary | ICD-10-CM | POA: Diagnosis not present

## 2021-02-20 DIAGNOSIS — I10 Essential (primary) hypertension: Secondary | ICD-10-CM | POA: Diagnosis not present

## 2021-02-20 DIAGNOSIS — K5792 Diverticulitis of intestine, part unspecified, without perforation or abscess without bleeding: Secondary | ICD-10-CM | POA: Diagnosis not present

## 2021-02-20 DIAGNOSIS — M5412 Radiculopathy, cervical region: Secondary | ICD-10-CM

## 2021-02-20 DIAGNOSIS — G43009 Migraine without aura, not intractable, without status migrainosus: Secondary | ICD-10-CM

## 2021-02-20 DIAGNOSIS — E1165 Type 2 diabetes mellitus with hyperglycemia: Secondary | ICD-10-CM | POA: Diagnosis not present

## 2021-02-20 DIAGNOSIS — M797 Fibromyalgia: Secondary | ICD-10-CM | POA: Insufficient documentation

## 2021-02-20 DIAGNOSIS — Z794 Long term (current) use of insulin: Secondary | ICD-10-CM | POA: Diagnosis not present

## 2021-02-20 MED ORDER — METOPROLOL TARTRATE 50 MG PO TABS: 25.0000 mg | ORAL_TABLET | Freq: Two times a day (BID) | ORAL | 3 refills | Status: DC

## 2021-02-20 MED ORDER — HYDROCHLOROTHIAZIDE 50 MG PO TABS: 25.0000 mg | ORAL_TABLET | Freq: Two times a day (BID) | ORAL | 3 refills | Status: DC

## 2021-02-20 NOTE — Progress Notes (Signed)
New Patient Office Visit  Subjective:  Patient ID: [name], female    DOB: [birth year]  Age: 71 y.o. MRN: [id number]  CC:  Chief Complaint  Patient presents with   Establish Care    Need blood pressure medications refilled    HPI [name] presents for new pt to establish   DM type II w/hyperglycemia-just saw endo yesterday.  A1C 10.4 on 01/01/21 HTN-metoprolol and HCTZ-no new HA/dizziness/ HLD-intolerant statins(has tried 6) Fibromyalgia-good and bad days.  Knows how much to do and not do Migraine-some from cerv radiculopathy.  Cervical radiculopathy-gets injections about every 3 months-so sugars increase.cervical and lumbar.  A  lot of muscle spasms H/o diverticulitis-has been to ER few times. 8.  Cyst on spine-this yr-twice had to go for I&D. Will drain at times.  Doxy mono-sick.  Past Medical History:  Diagnosis Date   Diabetes mellitus    Diverticulitis    Fibromyalgia    Hypercholesteremia    Hypertension    IBS (irritable bowel syndrome)    Migraines    Nasal fracture    Stein-Leventhal ovaries     Past Surgical History:  Procedure Laterality Date   CHOLECYSTECTOMY  1998   KNEE SURGERY Right 2017   LEFT HEART CATHETERIZATION WITH CORONARY ANGIOGRAM N/A 12/19/2011   Procedure: LEFT HEART CATHETERIZATION WITH CORONARY ANGIOGRAM;  Surgeon: Candee Furbish, MD;  Location: [facility] CATH LAB;  Service: Cardiovascular;  Laterality: N/A;   [place]    Family History  Adopted: Yes  Problem Relation Age of Onset   Failure to thrive Mother    Heart failure Father    Neuropathy Neg Hx     Social History   Socioeconomic History   Marital status: Single    Spouse name: Not on file   Number of children: 0   Years of education: Some college   Highest education level: Not on file  Occupational History   Occupation: accounting    Comment: [employer]  Tobacco Use   Smoking status: Never   Smokeless tobacco: Never   Vaping Use   Vaping Use: Never used  Substance and Sexual Activity   Alcohol use: Yes    Comment: rare   Drug use: No   Sexual activity: Not on file  Other Topics Concern   Not on file  Social History Narrative   Lives alone w/ her 3 cats   Right-handed   Caffeine: 2 cups coffee/tea per day      Adoptive Dad was 1/2 bro to pt birth mother   Social Determinants of Radio broadcast assistant Strain: Not on file  Food Insecurity: Not on file  Transportation Needs: Not on file  Physical Activity: Not on file  Stress: Not on file  Social Connections: Not on file  Intimate Partner Violence: Not on file    ROS: negative/noncontributory except as in HPI  Objective:   Today's Vitals: BP 140/80    Pulse 81    Temp (!) 97.5 F (36.4 C) (Temporal)    Ht 5\' 5"  (1.651 m)    Wt 190 lb (86.2 kg)    SpO2 98%    BMI 31.62 kg/m   Gen: WDWN NAD OWF HEENT: NCAT, conjunctiva not injected, sclera nonicteric TM WNL B, OP moist, no exudates  NECK:  supple, no thyromegaly, no nodes, no carotid bruits CARDIAC: RRR, S1S2+, no murmur. DP 2+B LUNGS: CTAB. No wheezes ABDOMEN:  BS+, soft, NTND,  No HSM, no masses EXT:  no edema MSK: no gross abnormalities.  NEURO: A&O x3.  CN II-XII intact.  PSYCH: normal mood. Good eye contact   Assessment & Plan:  Declines labs-needle phobia Problem List Items Addressed This Visit       Cardiovascular and Mediastinum   Essential hypertension   Relevant Medications   hydrochlorothiazide (HYDRODIURIL) 50 MG tablet   metoprolol tartrate (LOPRESSOR) 50 MG tablet     Endocrine   Type 2 diabetes mellitus with hyperglycemia, with long-term current use of insulin (HCC) - Primary     Other   Hyperlipidemia (Chronic)   Relevant Medications   hydrochlorothiazide (HYDRODIURIL) 50 MG tablet   metoprolol tartrate (LOPRESSOR) 50 MG tablet   Diverticulitis   Fibromyalgia   Relevant Medications   acetaminophen (TYLENOL) 650 MG CR tablet   tiZANidine  (ZANAFLEX) 4 MG tablet   Other Visit Diagnoses     Migraine without aura and without status migrainosus, not intractable       Relevant Medications   acetaminophen (TYLENOL) 650 MG CR tablet   tiZANidine (ZANAFLEX) 4 MG tablet   hydrochlorothiazide (HYDRODIURIL) 50 MG tablet   metoprolol tartrate (LOPRESSOR) 50 MG tablet   Cervical radiculopathy       Relevant Medications   tiZANidine (ZANAFLEX) 4 MG tablet   diazepam (VALIUM) 10 MG tablet      HTN-controlled. Cont meds.  Declined labs as "needle phobia"   discussed risk at K could be low, etc.   DM type II w/hyperglycemia-managed w/Endo Hyperlipidemia-mixed-intol statins.  Declines reck as "what is point as can't take statin".  Agree Fibromyalgia-stable.  Pt knows limitations  trying to stay active Cervical/lumbar radiculopathy-managed thru pain mgmt Migraine-some from neck.  Cont meds.  Try Mg Diverticulitis by hx-pt working on better diet and will try Mg for other things but may help constipaiton as well  F/u 2mo  Outpatient Encounter Medications as of 02/20/2021  Medication Sig   ACCU-CHEK SMARTVIEW test strip USE AS INSTRUCTED TO TEST TWICE DAILY   acetaminophen (TYLENOL) 650 MG CR tablet 2 tablets as needed   acetaminophen-codeine (TYLENOL #3) 300-30 MG per tablet Take 1 tablet by mouth 2 (two) times daily as needed for severe pain.    cholecalciferol (VITAMIN D) 1000 UNITS tablet Take 1,000 Units by mouth every evening.    diazepam (VALIUM) 10 MG tablet Take by mouth.   HYDROcodone-acetaminophen (NORCO) 7.5-325 MG tablet Take 1 tablet by mouth every 6 (six) hours as needed (Migraine headaches).    insulin NPH Human (NOVOLIN N) 100 UNIT/ML injection Inject 0.3 mLs (30 Units total) into the skin 2 (two) times daily before a meal.   insulin regular (NOVOLIN R RELION) 100 units/mL injection Inject 0.1-0.2 mLs (10-20 Units total) into the skin 3 (three) times daily before meals.   Lancets (ACCU-CHEK MULTICLIX) lancets Use as  instructed to test once daily DX E11.65   metroNIDAZOLE (FLAGYL) 500 MG tablet Take 1 tablet (500 mg total) by mouth 2 (two) times daily.   polyvinyl alcohol (LIQUIFILM TEARS) 1.4 % ophthalmic solution Place 1 drop into both eyes every 6 (six) hours as needed for dry eyes.   PRESCRIPTION MEDICATION Patient receives a Steroid injection in the neck once a year with Dr. Mina Marble at [facility] pt is unable to recall the exact name and i have been unable to verify name from the Dr's office. Patient also states " it shoots up my blood sugars really high, but i still get it  because i can't take anything else for my neck pain"   Semaglutide,0.25 or 0.5MG /DOS, (OZEMPIC, 0.25 OR 0.5 MG/DOSE,) 2 MG/1.5ML SOPN Inject 0.5 mg into the skin once a week.   tiZANidine (ZANAFLEX) 4 MG tablet 1 tablet as needed   [DISCONTINUED] hydrochlorothiazide (HYDRODIURIL) 50 MG tablet Take 25 mg by mouth 2 (two) times daily.   [DISCONTINUED] metoprolol (LOPRESSOR) 50 MG tablet Take 25 mg by mouth 2 (two) times daily.   hydrochlorothiazide (HYDRODIURIL) 50 MG tablet Take 0.5 tablets (25 mg total) by mouth 2 (two) times daily.   metoprolol tartrate (LOPRESSOR) 50 MG tablet Take 0.5 tablets (25 mg total) by mouth 2 (two) times daily.   [DISCONTINUED] acetaminophen (TYLENOL) 650 MG CR tablet Take 650 mg by mouth every 8 (eight) hours as needed for pain.   [DISCONTINUED] gabapentin (NEURONTIN) 100 MG capsule Take 1 capsule (100 mg total) by mouth 3 (three) times daily. (Patient not taking: No sig reported)   [DISCONTINUED] hydrochlorothiazide (HYDRODIURIL) 25 MG tablet Take 12.5 mg by mouth daily.   Facility-Administered Encounter Medications as of 02/20/2021  Medication   methylPREDNISolone sodium succinate (SOLU-MEDROL) 130 mg in sodium chloride 0.9 % 50 mL IVPB    Follow-up: Return in about 6 months (around 08/20/2021) for htn.   Wellington Hampshire, MD

## 2021-02-20 NOTE — Patient Instructions (Addendum)
Welcome to [facility]! It was a pleasure meeting you today.  As discussed, Please schedule a 6 month follow up visit today.  For cholesterol consider red yeast rice Take Magnesium 400mg  daily (for muscles) Take potassium  PLEASE NOTE:  If you had any LAB tests please let us know if you have not heard back within a few days. You may see your results on MyChart before we have a chance to review them but we will give you a call once they are reviewed by [place]. If we ordered any REFERRALS today, please let us know if you have not heard from their office within the next week.  Let us know through MyChart if you are needing REFILLS, or have your pharmacy send [place] the request. You can also use MyChart to communicate with me or any office staff.  Please try these tips to maintain a healthy lifestyle:  Eat most of your calories during the day when you are active. Eliminate processed foods including packaged sweets (pies, cakes, cookies), reduce intake of potatoes, white bread, white pasta, and white rice. Look for whole grain options, oat flour or almond flour.  Each meal should contain half fruits/vegetables, one quarter protein, and one quarter carbs (no bigger than a computer mouse).  Cut down on sweet beverages. This includes juice, soda, and sweet tea. Also watch fruit intake, though this is a healthier sweet option, it still contains natural sugar! Limit to 3 servings daily.  Drink at least 1 glass of water with each meal and aim for at least 8 glasses per day  Exercise at least 150 minutes every week.

## 2021-02-22 ENCOUNTER — Telehealth: Payer: Self-pay | Admitting: Internal Medicine

## 2021-02-22 MED ORDER — INSULIN NPH (HUMAN) (ISOPHANE) 100 UNIT/ML ~~LOC~~ SUSP: 30.0000 [IU] | Freq: Two times a day (BID) | SUBCUTANEOUS | 0 refills | Status: AC

## 2021-02-22 MED ORDER — INSULIN REGULAR HUMAN 100 UNIT/ML IJ SOLN: 10.0000 [IU] | Freq: Three times a day (TID) | INTRAMUSCULAR | 0 refills | Status: DC

## 2021-02-22 NOTE — Telephone Encounter (Signed)
Novolin N and Novolin R dosing instruction/quantity) - patient called to advise that pharmacy is requesting updated RX to reflect medication amounts and dosing instructions for only 30 day worth of medication (patient believes this should only 2 bottles of each).    Patient advises that pharmacy and insurance wont cover medications as they are currently written.  Pharmacy is  Pa[facility] nurse on [address] in [place]

## 2021-02-22 NOTE — Telephone Encounter (Signed)
Script has been sent for 30 day per patient

## 2021-03-06 ENCOUNTER — Ambulatory Visit (INDEPENDENT_AMBULATORY_CARE_PROVIDER_SITE_OTHER): Payer: PPO | Admitting: Family Medicine

## 2021-03-06 ENCOUNTER — Encounter: Payer: Self-pay | Admitting: Family Medicine

## 2021-03-06 VITALS — BP 130/80 | HR 89 | Temp 97.6°F | Ht 65.0 in | Wt 189.0 lb

## 2021-03-06 DIAGNOSIS — R059 Cough, unspecified: Secondary | ICD-10-CM

## 2021-03-06 LAB — POC COVID19 BINAXNOW: SARS Coronavirus 2 Ag: NEGATIVE

## 2021-03-06 MED ORDER — PREDNISONE 20 MG PO TABS: 40.0000 mg | ORAL_TABLET | Freq: Every day | ORAL | 0 refills | Status: AC

## 2021-03-06 MED ORDER — GUAIFENESIN-CODEINE 100-10 MG/5ML PO SOLN: 5.0000 mL | Freq: Three times a day (TID) | ORAL | 0 refills | Status: DC | PRN

## 2021-03-06 NOTE — Progress Notes (Signed)
Subjective:     Patient ID: [name], female    DOB: 1950-08-04, 71 y.o.   MRN: [id number]  Chief Complaint  Patient presents with   Cough    Mucus sometimes when coughing   Nasal Congestion    Draining really bad in throat    HPI Chief complaint: cough Symptom onset: about 1 wk, worse since 1/26. Pertinent positives: coughing spells "so hard about to throw up" runny nose, congestion, sneezing, a lot of drainage.  Pertinent negatives: no f/c. Covid neg here today. No h/o asthma.  Freq sinusitis Treatments tried: tussin, corecedin, claritin Vaccine status: not flu.  Just covid. Sick exposure: co-workers   Health Maintenance Due  Topic Date Due   Pneumonia Vaccine 29+ Years old (1 - PCV) Never done   FOOT EXAM  Never done   URINE MICROALBUMIN  Never done   Hepatitis C Screening  Never done   COLONOSCOPY (Pts 45-64yrs Insurance coverage will need to be confirmed)  Never done   MAMMOGRAM  Never done   Zoster Vaccines- Shingrix (1 of 2) Never done   DEXA SCAN  Never done   TETANUS/TDAP  07/27/2019    Past Medical History:  Diagnosis Date   Diabetes mellitus    Diverticulitis    Fibromyalgia    Hypercholesteremia    Hypertension    IBS (irritable bowel syndrome)    Migraines    Nasal fracture    Stein-[place] ovaries     Past Surgical History:  Procedure Laterality Date   CHOLECYSTECTOMY  1998   KNEE SURGERY Right 2017   LEFT HEART CATHETERIZATION WITH CORONARY ANGIOGRAM N/A 12/19/2011   Procedure: LEFT HEART CATHETERIZATION WITH CORONARY ANGIOGRAM;  Surgeon: Candee Furbish, MD;  Location: [facility] CATH LAB;  Service: Cardiovascular;  Laterality: N/A;   [place]    Outpatient Medications Prior to Visit  Medication Sig Dispense Refill   ACCU-CHEK SMARTVIEW test strip USE AS INSTRUCTED TO TEST TWICE DAILY 100 each 4   acetaminophen (TYLENOL) 650 MG CR tablet 2 tablets as needed     acetaminophen-codeine (TYLENOL #3) 300-30 MG per  tablet Take 1 tablet by mouth 2 (two) times daily as needed for severe pain.      cholecalciferol (VITAMIN D) 1000 UNITS tablet Take 1,000 Units by mouth every evening.      hydrochlorothiazide (HYDRODIURIL) 50 MG tablet Take 0.5 tablets (25 mg total) by mouth 2 (two) times daily. 30 tablet 3   HYDROcodone-acetaminophen (NORCO) 7.5-325 MG tablet Take 1 tablet by mouth every 6 (six) hours as needed (Migraine headaches).      insulin NPH Human (NOVOLIN N) 100 UNIT/ML injection Inject 0.3 mLs (30 Units total) into the skin 2 (two) times daily before a meal. 20 mL 0   insulin regular (NOVOLIN R RELION) 100 units/mL injection Inject 0.1-0.2 mLs (10-20 Units total) into the skin 3 (three) times daily before meals. 20 mL 0   Lancets (ACCU-CHEK MULTICLIX) lancets Use as instructed to test once daily DX E11.65 100 each 4   metoprolol tartrate (LOPRESSOR) 50 MG tablet Take 0.5 tablets (25 mg total) by mouth 2 (two) times daily. 30 tablet 3   PRESCRIPTION MEDICATION Patient receives a Steroid injection in the neck once a year with Dr. Mina Marble at [facility] pt is unable to recall the exact name and i have been unable to verify name from the Dr's office. Patient also states " it shoots up my blood  sugars really high, but i still get it because i can't take anything else for my neck pain"     Semaglutide,0.25 or 0.5MG /DOS, (OZEMPIC, 0.25 OR 0.5 MG/DOSE,) 2 MG/1.5ML SOPN Inject 0.5 mg into the skin once a week. 4.5 mL 3   tiZANidine (ZANAFLEX) 4 MG tablet 1 tablet as needed     diazepam (VALIUM) 10 MG tablet Take by mouth.     metroNIDAZOLE (FLAGYL) 500 MG tablet Take 1 tablet (500 mg total) by mouth 2 (two) times daily. 14 tablet 0   polyvinyl alcohol (LIQUIFILM TEARS) 1.4 % ophthalmic solution Place 1 drop into both eyes every 6 (six) hours as needed for dry eyes.     Facility-Administered Medications Prior to Visit  Medication Dose Route Frequency Provider Last Rate Last Admin   methylPREDNISolone sodium  succinate (SOLU-MEDROL) 130 mg in sodium chloride 0.9 % 50 mL IVPB  130 mg Intravenous Once Jerline Pain, MD        Allergies  Allergen Reactions   Ceftriaxone Hives, Itching and Other (See Comments)   Erythromycin Anaphylaxis and Other (See Comments)   Iodinated Contrast Media Hives, Other (See Comments) and Shortness Of Breath   Amoxicillin-Pot Clavulanate Nausea And Vomiting    Nausea, vomiting, headache, dizziness. Hives on 08/2019 Other reaction(s): hives, Other Nausea, vomiting, headache, dizziness   pt states she can take other Penicillins just not augmentin Nausea, vomiting, headache, dizziness. Hives on 08/2019    Canagliflozin Rash and Other (See Comments)    Dehydration Dehydration Dehydration    Statins Other (See Comments)    Severe muscle pain/weakness Muscle pain/ weekness    Sulfa Antibiotics Other (See Comments)    blackout PER PT SHE BLACKS OUT    Cipro [Ciprofloxacin Hcl] Other (See Comments)   Corticosteroids Other (See Comments)   Insulin Glargine Other (See Comments)    Severe ankle swelling   Metformin And Related Diarrhea and Nausea And Vomiting    Violently ill for 2 yrs   Metformin Hcl Other (See Comments)   Prednisone Other (See Comments)    Abnormal blood sugars.Elyse Hsu [Shellfish Allergy] Diarrhea and Nausea And Vomiting    Severe GI upset   Tramadol Hcl Other (See Comments)   Nsaids Other (See Comments)    Stomach problems Stomach problems    Tramadol Rash   BBC:[id number] except as noted in HPI      Objective:     BP 130/80    Pulse 89    Temp 97.6 F (36.4 C) (Temporal)    Ht 5\' 5"  (1.651 m)    Wt 189 lb (85.7 kg)    SpO2 99%    BMI 31.45 kg/m  Wt Readings from Last 3 Encounters:  03/06/21 189 lb (85.7 kg)  02/20/21 190 lb (86.2 kg)  10/12/20 188 lb (85.3 kg)        Gen: WDWN NAD WF HEENT: NCAT, conjunctiva not injected, sclera nonicteric TM WNL B, OP moist, no exudates .  Very congested.  Tender R  maxillary area NECK:  supple, no thyromegaly, no nodes, no carotid bruits CARDIAC: RRR, S1S2+, no murmur. DP 2+B LUNGS: CTAB. No wheezes EXT:  no edema MSK: no gross abnormalities.  NEURO: A&O x3.  CN II-XII intact.  PSYCH: normal mood. Good eye contact  Results for orders placed or performed in visit on 03/06/21  POC COVID-19  Result Value Ref Range   SARS Coronavirus 2 Ag Negative Negative     Assessment & Plan:  Problem List Items Addressed This Visit   None Visit Diagnoses     Cough, unspecified type    -  Primary   Relevant Orders   POC COVID-19 (Completed)      Cough/congestion-pt can take pred.   Pred and codeine cough syrup.  flonase  No orders of the defined types were placed in this encounter.   Wellington Hampshire, MD

## 2021-03-06 NOTE — Patient Instructions (Signed)
Meds have been sent the the pharmacy °You can take tylenol for pain/fevers °If worsening symptoms, let us know or go to the Emergency room  ° ° °

## 2021-03-17 ENCOUNTER — Other Ambulatory Visit: Payer: Self-pay

## 2021-03-17 ENCOUNTER — Emergency Department (HOSPITAL_BASED_OUTPATIENT_CLINIC_OR_DEPARTMENT_OTHER): Payer: PPO

## 2021-03-17 ENCOUNTER — Emergency Department (HOSPITAL_BASED_OUTPATIENT_CLINIC_OR_DEPARTMENT_OTHER)
Admission: EM | Admit: 2021-03-17 | Discharge: 2021-03-17 | Disposition: A | Discharge status: Home / Self Care | Payer: PPO | Attending: Emergency Medicine | Admitting: Emergency Medicine

## 2021-03-17 ENCOUNTER — Encounter (HOSPITAL_BASED_OUTPATIENT_CLINIC_OR_DEPARTMENT_OTHER): Payer: Self-pay

## 2021-03-17 DIAGNOSIS — E876 Hypokalemia: Secondary | ICD-10-CM | POA: Insufficient documentation

## 2021-03-17 DIAGNOSIS — R109 Unspecified abdominal pain: Secondary | ICD-10-CM | POA: Diagnosis not present

## 2021-03-17 DIAGNOSIS — E119 Type 2 diabetes mellitus without complications: Secondary | ICD-10-CM | POA: Insufficient documentation

## 2021-03-17 DIAGNOSIS — K529 Noninfective gastroenteritis and colitis, unspecified: Secondary | ICD-10-CM | POA: Diagnosis not present

## 2021-03-17 DIAGNOSIS — D72829 Elevated white blood cell count, unspecified: Secondary | ICD-10-CM | POA: Insufficient documentation

## 2021-03-17 DIAGNOSIS — I700 Atherosclerosis of aorta: Secondary | ICD-10-CM | POA: Diagnosis not present

## 2021-03-17 DIAGNOSIS — Z794 Long term (current) use of insulin: Secondary | ICD-10-CM | POA: Insufficient documentation

## 2021-03-17 LAB — COMPREHENSIVE METABOLIC PANEL
ALT: 10 U/L (ref 0–44)
AST: 12 U/L — ABNORMAL LOW (ref 15–41)
Albumin: 4.3 g/dL (ref 3.5–5.0)
Alkaline Phosphatase: 101 U/L (ref 38–126)
Anion gap: 13 (ref 5–15)
BUN: 20 mg/dL (ref 8–23)
CO2: 32 mmol/L (ref 22–32)
Calcium: 10.2 mg/dL (ref 8.9–10.3)
Chloride: 93 mmol/L — ABNORMAL LOW (ref 98–111)
Creatinine, Ser: 0.98 mg/dL (ref 0.44–1.00)
GFR, Estimated: >60 mL/min (ref >60)
Glucose, Bld: 196 mg/dL — ABNORMAL HIGH (ref 70–99)
Potassium: 2.7 mmol/L — CL (ref 3.5–5.1)
Sodium: 138 mmol/L (ref 135–145)
Total Bilirubin: 1.2 mg/dL (ref 0.3–1.2)
Total Protein: 7.8 g/dL (ref 6.5–8.1)

## 2021-03-17 LAB — CBC WITH DIFFERENTIAL/PLATELET
Abs Immature Granulocytes: 0.27 10*3/uL — ABNORMAL HIGH (ref 0.00–0.07)
Basophils Absolute: 0.1 10*3/uL (ref 0.0–0.1)
Basophils Relative: 0 %
Eosinophils Absolute: 0.1 10*3/uL (ref 0.0–0.5)
Eosinophils Relative: 0 %
HCT: 44.5 % (ref 36.0–46.0)
Hemoglobin: 15.3 g/dL — ABNORMAL HIGH (ref 12.0–15.0)
Immature Granulocytes: 1 %
Lymphocytes Relative: 9 %
Lymphs Abs: 2.4 10*3/uL (ref 0.7–4.0)
MCH: 30.2 pg (ref 26.0–34.0)
MCHC: 34.4 g/dL (ref 30.0–36.0)
MCV: 87.9 fL (ref 80.0–100.0)
Monocytes Absolute: 2 10*3/uL — ABNORMAL HIGH (ref 0.1–1.0)
Monocytes Relative: 8 %
Neutro Abs: 21.8 10*3/uL — ABNORMAL HIGH (ref 1.7–7.7)
Neutrophils Relative %: 82 %
Platelets: 295 10*3/uL (ref 150–400)
RBC: 5.06 MIL/uL (ref 3.87–5.11)
RDW: 12.6 % (ref 11.5–15.5)
WBC: 26.7 10*3/uL — ABNORMAL HIGH (ref 4.0–10.5)
nRBC: 0 % (ref 0.0–0.2)

## 2021-03-17 LAB — URINALYSIS, ROUTINE W REFLEX MICROSCOPIC
Glucose, UA: 100 mg/dL — AB
Hgb urine dipstick: NEGATIVE
Ketones, ur: 15 mg/dL — AB
Leukocytes,Ua: NEGATIVE
Nitrite: NEGATIVE
Protein, ur: 30 mg/dL — AB
Specific Gravity, Urine: 1.032 — ABNORMAL HIGH (ref 1.005–1.030)
pH: 5.5 (ref 5.0–8.0)

## 2021-03-17 LAB — LIPASE, BLOOD: Lipase: 14 U/L (ref 11–51)

## 2021-03-17 MED ORDER — CIPROFLOXACIN HCL 500 MG PO TABS
500.0000 mg | ORAL_TABLET | Freq: Once | ORAL | Status: AC
  Administered 2021-03-17: 500 mg via ORAL
  Filled 2021-03-17: qty 1

## 2021-03-17 MED ORDER — METRONIDAZOLE 500 MG PO TABS: 500.0000 mg | ORAL_TABLET | Freq: Three times a day (TID) | ORAL | 0 refills | Status: DC

## 2021-03-17 MED ORDER — POTASSIUM CHLORIDE 10 MEQ/100ML IV SOLN
10.0000 meq | INTRAVENOUS | Status: AC
  Administered 2021-03-17 (×2): 10 meq via INTRAVENOUS
  Filled 2021-03-17 (×2): qty 100

## 2021-03-17 MED ORDER — METRONIDAZOLE 500 MG PO TABS
500.0000 mg | ORAL_TABLET | Freq: Once | ORAL | Status: AC
  Administered 2021-03-17: 500 mg via ORAL
  Filled 2021-03-17: qty 1

## 2021-03-17 MED ORDER — ONDANSETRON 4 MG PO TBDP: 4.0000 mg | ORAL_TABLET | Freq: Three times a day (TID) | ORAL | 0 refills | Status: DC | PRN

## 2021-03-17 MED ORDER — SODIUM CHLORIDE 0.9 % IV BOLUS
1000.0000 mL | Freq: Once | INTRAVENOUS | Status: AC
  Administered 2021-03-17: 1000 mL via INTRAVENOUS

## 2021-03-17 MED ORDER — CIPROFLOXACIN HCL 500 MG PO TABS: 500.0000 mg | ORAL_TABLET | Freq: Two times a day (BID) | ORAL | 0 refills | Status: DC

## 2021-03-17 MED ORDER — SODIUM CHLORIDE 0.9 % IV SOLN: INTRAVENOUS | Status: DC | PRN

## 2021-03-17 MED ORDER — MORPHINE SULFATE (PF) 4 MG/ML IV SOLN
4.0000 mg | Freq: Once | INTRAVENOUS | Status: AC
  Administered 2021-03-17: 4 mg via INTRAVENOUS
  Filled 2021-03-17: qty 1

## 2021-03-17 MED ORDER — HYDROCODONE-ACETAMINOPHEN 5-325 MG PO TABS: 1.0000 | ORAL_TABLET | Freq: Four times a day (QID) | ORAL | 0 refills | Status: DC | PRN

## 2021-03-17 MED ORDER — ONDANSETRON HCL 4 MG/2ML IJ SOLN
4.0000 mg | Freq: Once | INTRAMUSCULAR | Status: AC
  Administered 2021-03-17: 4 mg via INTRAVENOUS
  Filled 2021-03-17: qty 2

## 2021-03-17 MED ORDER — POTASSIUM CHLORIDE CRYS ER 20 MEQ PO TBCR
40.0000 meq | EXTENDED_RELEASE_TABLET | Freq: Once | ORAL | Status: AC
  Administered 2021-03-17: 40 meq via ORAL
  Filled 2021-03-17: qty 2

## 2021-03-17 NOTE — Discharge Instructions (Addendum)
Please read and follow all provided instructions.  Your diagnoses today include:  1. Colitis   2. Hypokalemia     Tests performed today include: Blood cell counts and platelets: Very high white blood cell count Kidney and liver function tests: Low potassium Pancreas function test (called lipase): Normal Urine test to look for infection: Suggest mild dehydration CT scan of the abdomen and pelvis: Suggests colitis Vital signs. See below for your results today.   Medications prescribed:  Ciprofloxacin - antibiotic  You have been prescribed an antibiotic medicine: take the entire course of medicine even if you are feeling better. Stopping early can cause the antibiotic not to work.  Metronidazole - antibiotic  You have been prescribed an antibiotic medicine: take the entire course of medicine even if you are feeling better. Stopping early can cause the antibiotic not to work. Do not drink alcohol when taking this medication.   Vicodin (hydrocodone/acetaminophen) - narcotic pain medication  DO NOT drive or perform any activities that require you to be awake and alert because this medicine can make you drowsy. BE VERY CAREFUL not to take multiple medicines containing Tylenol (also called acetaminophen). Doing so can lead to an overdose which can damage your liver and cause liver failure and possibly death.  Zofran (ondansetron) - for nausea and vomiting  Take any prescribed medications only as directed.  Home care instructions:  Follow any educational materials contained in this packet.  Follow-up instructions: Please follow-up with your primary care provider in the next 2 days for further evaluation of your symptoms.    Return instructions:  SEEK IMMEDIATE MEDICAL ATTENTION IF: The pain does not go away or becomes severe  A temperature above 101F develops  Repeated vomiting occurs (multiple episodes)  The pain becomes localized to portions of the abdomen. The right side could  possibly be appendicitis. In an adult, the left lower portion of the abdomen could be colitis or diverticulitis.  Blood is being passed in stools or vomit (bright red or black tarry stools)  You develop chest pain, difficulty breathing, dizziness or fainting, or become confused, poorly responsive, or inconsolable (young children) If you have any other emergent concerns regarding your health  Additional Information: Abdominal (belly) pain can be caused by many things. Your caregiver performed an examination and possibly ordered blood/urine tests and imaging (CT scan, x-rays, ultrasound). Many cases can be observed and treated at home after initial evaluation in the emergency department. Even though you are being discharged home, abdominal pain can be unpredictable. Therefore, you need a repeated exam if your pain does not resolve, returns, or worsens. Most patients with abdominal pain don't have to be admitted to the hospital or have surgery, but serious problems like appendicitis and gallbladder attacks can start out as nonspecific pain. Many abdominal conditions cannot be diagnosed in one visit, so follow-up evaluations are very important.  Your vital signs today were: BP 130/73    Pulse 95    Temp 97.9 F (36.6 C)    Resp 18    Ht 5\' 5"  (1.651 m)    Wt 85.3 kg    SpO2 98%    BMI 31.28 kg/m  If your blood pressure (bp) was elevated above 135/85 this visit, please have this repeated by your doctor within one month. --------------

## 2021-03-17 NOTE — ED Notes (Signed)
CRITICAL VALUE STICKER  CRITICAL VALUE:K+ 2.7  RECEIVER (on-site recipient of call):Shawnie Pons, RN  DATE & TIME NOTIFIED: 03-17-2021 1556  MESSENGER (representative from lab):  MD NOTIFIED: Dr. Ronnald Nian   TIME OF NOTIFICATION:1557  RESPONSE:

## 2021-03-17 NOTE — ED Triage Notes (Signed)
Patient here POV from Home with ABD Pain.  Pain began approximately 3 hours PTA and remained constant since. Cramping in [place]. Pain is generalized across Lower and MID ABD.  Moderate Nausea. Moderate Emesis. Mild Diarrhea. No Fevers.   NAD Noted during Triage. A&Ox4. GCS 15. Ambulatory.

## 2021-03-17 NOTE — ED Notes (Signed)
Pt verbalizes understanding of discharge instructions. Opportunity for questioning and answers were provided. Pt discharged from ED to home.   ? ?

## 2021-03-17 NOTE — ED Provider Notes (Signed)
[facility] EMERGENCY DEPT Provider Note   CSN: [id number] Arrival date & time: 03/17/21  1419     History  Chief Complaint  Patient presents with   Abdominal Pain    [name] is a 71 y.o. female.  Patient with history of diabetes, diverticulitis, colitis, history of cholecystectomy --presents to the emergency department today for evaluation of abdominal pain.  Symptoms started abruptly around noon today.  Patient had similar to pain that she started to have vomiting.  No diarrhea.  No urinary symptoms.  Symptoms are similar to when she has had diverticulitis in the past.  No chest pain or shortness of breath.  No fevers.  No treatments prior to arrival.      Home Medications Prior to Admission medications   Medication Sig Start Date End Date Taking? Authorizing Provider  ACCU-CHEK SMARTVIEW test strip USE AS INSTRUCTED TO TEST TWICE DAILY 09/12/20   Philemon Kingdom, MD  acetaminophen (TYLENOL) 650 MG CR tablet 2 tablets as needed    [provider]  acetaminophen-codeine (TYLENOL #3) 300-30 MG per tablet Take 1 tablet by mouth 2 (two) times daily as needed for severe pain.     [provider]  cholecalciferol (VITAMIN D) 1000 UNITS tablet Take 1,000 Units by mouth every evening.     [provider]  guaiFENesin-codeine 100-10 MG/5ML syrup Take 5 mLs by mouth 3 (three) times daily as needed for cough. 03/06/21   Tawnya Crook, MD  hydrochlorothiazide (HYDRODIURIL) 50 MG tablet Take 0.5 tablets (25 mg total) by mouth 2 (two) times daily. 02/20/21   Tawnya Crook, MD  HYDROcodone-acetaminophen [facility]) 7.5-325 MG tablet Take 1 tablet by mouth every 6 (six) hours as needed (Migraine headaches).     [provider]  insulin NPH Human (NOVOLIN N) 100 UNIT/ML injection Inject 0.3 mLs (30 Units total) into the skin 2 (two) times daily before a meal. 02/22/21   Philemon Kingdom, MD  insulin regular (NOVOLIN R RELION) 100 units/mL  injection Inject 0.1-0.2 mLs (10-20 Units total) into the skin 3 (three) times daily before meals. 02/22/21   Philemon Kingdom, MD  Lancets (ACCU-CHEK MULTICLIX) lancets Use as instructed to test once daily DX E11.65 08/16/19   Philemon Kingdom, MD  metoprolol tartrate (LOPRESSOR) 50 MG tablet Take 0.5 tablets (25 mg total) by mouth 2 (two) times daily. 02/20/21   Tawnya Crook, MD  PRESCRIPTION MEDICATION Patient receives a Steroid injection in the neck once a year with Dr. Mina Marble at [facility] pt is unable to recall the exact name and i have been unable to verify name from the Dr's office. Patient also states " it shoots up my blood sugars really high, but i still get it because i can't take anything else for my neck pain"    [provider]  Semaglutide,0.25 or 0.5MG /DOS, (OZEMPIC, 0.25 OR 0.5 MG/DOSE,) 2 MG/1.5ML SOPN Inject 0.5 mg into the skin once a week. 02/19/21   Philemon Kingdom, MD  tiZANidine (ZANAFLEX) 4 MG tablet 1 tablet as needed    [provider]  gabapentin (NEURONTIN) 100 MG capsule Take 1 capsule (100 mg total) by mouth 3 (three) times daily. Patient not taking: No sig reported 07/07/17 07/06/19  Melvenia Beam, MD      Allergies    Ceftriaxone, Erythromycin, Iodinated contrast media, Amoxicillin-pot clavulanate, Canagliflozin, Statins, Sulfa antibiotics, Cipro [ciprofloxacin hcl], Corticosteroids, Insulin glargine, Metformin and related, Metformin hcl, Prednisone, Scallops [shellfish allergy], Tramadol hcl, Nsaids, and Tramadol  Review of Systems   Review of Systems  Physical Exam Updated Vital Signs BP (!) 175/84 (BP Location: Right Arm)    Pulse 92    Temp 97.9 F (36.6 C)    Resp 18    Ht 5\' 5"  (1.651 m)    Wt 85.3 kg    SpO2 100%    BMI 31.28 kg/m  Physical Exam Vitals and nursing note reviewed.  Constitutional:      General: She is in acute distress (Appears uncomfortable).     Appearance: She is well-developed.  HENT:     Head:  Normocephalic and atraumatic.     Right Ear: External ear normal.     Left Ear: External ear normal.     Nose: Nose normal.  Eyes:     Conjunctiva/sclera: Conjunctivae normal.  Cardiovascular:     Rate and Rhythm: Normal rate and regular rhythm.     Heart sounds: No murmur heard. Pulmonary:     Effort: No respiratory distress.     Breath sounds: No wheezing, rhonchi or rales.  Abdominal:     Palpations: Abdomen is soft.     Tenderness: There is abdominal tenderness in the right lower quadrant, periumbilical area, suprapubic area and left lower quadrant. There is no guarding or rebound.  Musculoskeletal:     Cervical back: Normal range of motion and neck supple.     Right lower leg: No edema.     Left lower leg: No edema.  Skin:    General: Skin is warm and dry.     Findings: No rash.  Neurological:     General: No focal deficit present.     Mental Status: She is alert. Mental status is at baseline.     Motor: No weakness.  Psychiatric:        Mood and Affect: Mood normal.    ED Results / Procedures / Treatments   Labs (all labs ordered are listed, but only abnormal results are displayed) Labs Reviewed  CBC WITH DIFFERENTIAL/PLATELET - Abnormal; Notable for the following components:      Result Value   WBC 26.7 (*)    Hemoglobin 15.3 (*)    Neutro Abs 21.8 (*)    Monocytes Absolute 2.0 (*)    Abs Immature Granulocytes 0.27 (*)    All other components within normal limits  COMPREHENSIVE METABOLIC PANEL - Abnormal; Notable for the following components:   Potassium 2.7 (*)    Chloride 93 (*)    Glucose, Bld 196 (*)    AST 12 (*)    All other components within normal limits  URINALYSIS, ROUTINE W REFLEX MICROSCOPIC - Abnormal; Notable for the following components:   Specific Gravity, Urine 1.032 (*)    Glucose, UA 100 (*)    Bilirubin Urine SMALL (*)    Ketones, ur 15 (*)    Protein, ur 30 (*)    All other components within normal limits  LIPASE, BLOOD     EKG None  Radiology No results found.  Procedures Procedures    Medications Ordered in ED Medications  morphine (PF) 4 MG/ML injection 4 mg (has no administration in time range)  ondansetron (ZOFRAN) injection 4 mg (has no administration in time range)  ondansetron (ZOFRAN) injection 4 mg (4 mg Intravenous Given 03/17/21 1524)    ED Course/ Medical Decision Making/ A&P   Patient seen and examined. History obtained directly from patient. Work-up including labs, imaging, EKG ordered in triage, if performed, were reviewed.  Labs/EKG: Independently reviewed and interpreted.  This included: CBC with white blood cell count 26.7, hemoglobin 15.3; CMP with potassium 2.7, glucose 196, normal creatinine; normal lipase; UA with elevated spec gravity, not suggestive of infection.  Imaging: CT imaging ordered.  Will perform without contrast due to contrast allergy.  Medications/Fluids: Ordered: IV morphine, Zofran.    Most recent vital signs reviewed and are as follows: BP (!) 175/84 (BP Location: Right Arm)    Pulse 92    Temp 97.9 F (36.6 C)    Resp 18    Ht 5\' 5"  (1.651 m)    Wt 85.3 kg    SpO2 100%    BMI 31.28 kg/m   Initial impression: Abdominal pain, leukocytosis worse than baseline leukocytosis  9:27 PM Reassessment performed. Patient appears comfortable.  She is on her second round of potassium.  I reviewed previous records and shows that most recently patient was treated with Cipro and Flagyl for her colitis.  Discussed this with patient.  She is in agreement as far as these antibiotics are concerned.  Will discharged home with medication for pain and nausea as well.  Labs and imaging to this point personally reviewed and interpreted including: CT imaging, agrees signs of colitis, mild without complication.  Reviewed pertinent lab work and imaging with patient at bedside including: CT imaging.  Most current vital signs reviewed and are as follows: BP 130/73    Pulse 95     Temp 97.9 F (36.6 C)    Resp 18    Ht 5\' 5"  (1.651 m)    Wt 85.3 kg    SpO2 98%    BMI 31.28 kg/m   Plan: Discussed inpatient treatment versus outpatient trial with patient.  She would like to go home.  Patient agrees to stay in the emergency department to ensure that her symptoms are well controlled while she receives 2 runs of IV potassium.  Discussed that her pain and nausea need to be well controlled so that we have confidence that she will do well at home.  She is in agreement.  She has done well during her ED stay.   Encourage follow-up with PCP in the next 48 to 72 hours for recheck.  The patient was urged to return to the Emergency Department immediately with worsening of current symptoms, worsening abdominal pain, persistent vomiting, blood noted in stools, fever, or any other concerns. The patient verbalized understanding.                               Medical Decision Making Amount and/or Complexity of Data Reviewed Labs: ordered. Radiology: ordered.  Risk Prescription drug management.   For this patient's complaint of abdominal pain, the following conditions were considered on the differential diagnosis: gastritis/PUD, enteritis/duodenitis, appendicitis, cholelithiasis/cholecystitis, cholangitis, pancreatitis, ruptured viscus, colitis, diverticulitis, proctitis, cystitis, pyelonephritis, ureteral colic, aortic dissection, aortic aneurysm. In women, ectopic pregnancy, pelvic inflammatory disease, ovarian cysts, and tubo-ovarian abscess were also considered. Atypical chest etiologies were also considered including ACS, PE, and pneumonia.   Patient symptoms and work-up today are consistent with previous bouts of colitis.  Her white blood cell count is markedly elevated into the 20s.  No fever, tachycardia or other signs of sepsis.  She looks well, nontoxic.  Her pain and nausea were well controlled with IV medications here and she has tolerated potassium and antibiotics.  She  is motivated to go home and seems reliable to return  if something does change or worsen.    The patient's vital signs, pertinent lab work and imaging were reviewed and interpreted as discussed in the ED course. Hospitalization was considered for further testing, treatments, or serial exams/observation. However as patient is well-appearing, has a stable exam, I do not feel that admission is compulsory at this time and she warrants trial of outpatient therapy. This plan was discussed with the patient who verbalizes agreement and comfort with this plan and seems reliable and able to return to the Emergency Department with worsening or changing symptoms.          Final Clinical Impression(s) / ED Diagnoses Final diagnoses:  Colitis  Hypokalemia    Rx / DC Orders ED Discharge Orders          Ordered    HYDROcodone-acetaminophen (NORCO/VICODIN) 5-325 MG tablet  Every 6 hours PRN        03/17/21 2124    ondansetron (ZOFRAN-ODT) 4 MG disintegrating tablet  Every 8 hours PRN        03/17/21 2124    ciprofloxacin (CIPRO) 500 MG tablet  2 times daily        03/17/21 2124    metroNIDAZOLE (FLAGYL) 500 MG tablet  3 times daily        03/17/21 2124              Carlisle Cater, PA-C 03/17/21 2131    Lennice Sites, DO 03/17/21 2326

## 2021-03-20 ENCOUNTER — Encounter: Payer: Self-pay | Admitting: Family Medicine

## 2021-03-20 ENCOUNTER — Other Ambulatory Visit: Payer: Self-pay

## 2021-03-20 ENCOUNTER — Ambulatory Visit (INDEPENDENT_AMBULATORY_CARE_PROVIDER_SITE_OTHER): Payer: PPO | Admitting: Family Medicine

## 2021-03-20 VITALS — BP 122/78 | HR 78 | Temp 97.5°F | Ht 65.0 in

## 2021-03-20 DIAGNOSIS — K529 Noninfective gastroenteritis and colitis, unspecified: Secondary | ICD-10-CM

## 2021-03-20 NOTE — Progress Notes (Signed)
Subjective:     Patient ID: [name], female    DOB: Sep 28, 1950, 71 y.o.   MRN: [id number]  Chief Complaint  Patient presents with   Follow-up    ER follow-up     HPI F/u from ED-diverticulitis. Pt seen in ED for diverticulitis on 03/17/21.  WBC 26K.  CT-colitis transverse/descending/sigmoid colon.  K was 2.7.  got K in hosp.  Sent home on cipro/flagyl.  Was feeling better yesterday, but today, more crampy.  Blood red stool on 2/11.  Last bm 2/12 but not eating much.  No more f/c. Some nausea today.   Last cscope around 09/2019. Had diverticulitis 10/12/20, 08/09/20.    Probiotics makes bloated.  Gummy fiber.  Rectal bleed July and Sept 2021 and diverticulitis 08/02/19  Health Maintenance Due  Topic Date Due   FOOT EXAM  Never done   URINE MICROALBUMIN  Never done   Hepatitis C Screening  Never done   COLONOSCOPY (Pts 45-24yrs Insurance coverage will need to be confirmed)  Never done   MAMMOGRAM  Never done   Zoster Vaccines- Shingrix (1 of 2) Never done   Pneumonia Vaccine 55+ Years old (1 - PCV) Never done   DEXA SCAN  Never done   TETANUS/TDAP  07/27/2019    Past Medical History:  Diagnosis Date   Diabetes mellitus    Diverticulitis    Fibromyalgia    Hypercholesteremia    Hypertension    IBS (irritable bowel syndrome)    Migraines    Nasal fracture    Stein-Leventhal ovaries     Past Surgical History:  Procedure Laterality Date   CHOLECYSTECTOMY  1998   KNEE SURGERY Right 2017   LEFT HEART CATHETERIZATION WITH CORONARY ANGIOGRAM N/A 12/19/2011   Procedure: LEFT HEART CATHETERIZATION WITH CORONARY ANGIOGRAM;  Surgeon: Candee Furbish, MD;  Location: [facility] CATH LAB;  Service: Cardiovascular;  Laterality: N/A;   [place]    Outpatient Medications Prior to Visit  Medication Sig Dispense Refill   ACCU-CHEK SMARTVIEW test strip USE AS INSTRUCTED TO TEST TWICE DAILY 100 each 4   acetaminophen (TYLENOL) 650 MG CR tablet 2 tablets as  needed     acetaminophen-codeine (TYLENOL #3) 300-30 MG per tablet Take 1 tablet by mouth 2 (two) times daily as needed for severe pain.      cholecalciferol (VITAMIN D) 1000 UNITS tablet Take 1,000 Units by mouth every evening.      ciprofloxacin (CIPRO) 500 MG tablet Take 1 tablet (500 mg total) by mouth 2 (two) times daily. 14 tablet 0   guaiFENesin-codeine 100-10 MG/5ML syrup Take 5 mLs by mouth 3 (three) times daily as needed for cough. 120 mL 0   hydrochlorothiazide (HYDRODIURIL) 50 MG tablet Take 0.5 tablets (25 mg total) by mouth 2 (two) times daily. 30 tablet 3   HYDROcodone-acetaminophen (NORCO/VICODIN) 5-325 MG tablet Take 1 tablet by mouth every 6 (six) hours as needed for severe pain. 8 tablet 0   insulin NPH Human (NOVOLIN N) 100 UNIT/ML injection Inject 0.3 mLs (30 Units total) into the skin 2 (two) times daily before a meal. 20 mL 0   insulin regular (NOVOLIN R RELION) 100 units/mL injection Inject 0.1-0.2 mLs (10-20 Units total) into the skin 3 (three) times daily before meals. 20 mL 0   Lancets (ACCU-CHEK MULTICLIX) lancets Use as instructed to test once daily DX E11.65 100 each 4   metoprolol tartrate (LOPRESSOR) 50 MG tablet Take 0.5  tablets (25 mg total) by mouth 2 (two) times daily. 30 tablet 3   metroNIDAZOLE (FLAGYL) 500 MG tablet Take 1 tablet (500 mg total) by mouth 3 (three) times daily. 21 tablet 0   ondansetron (ZOFRAN-ODT) 4 MG disintegrating tablet Take 1 tablet (4 mg total) by mouth every 8 (eight) hours as needed for nausea or vomiting. 10 tablet 0   PRESCRIPTION MEDICATION Patient receives a Steroid injection in the neck once a year with Dr. Mina Marble at [facility] pt is unable to recall the exact name and i have been unable to verify name from the Dr's office. Patient also states " it shoots up my blood sugars really high, but i still get it because i can't take anything else for my neck pain"     Semaglutide,0.25 or 0.5MG /DOS, (OZEMPIC, 0.25 OR 0.5 MG/DOSE,) 2  MG/1.5ML SOPN Inject 0.5 mg into the skin once a week. 4.5 mL 3   tiZANidine (ZANAFLEX) 4 MG tablet 1 tablet as needed     Facility-Administered Medications Prior to Visit  Medication Dose Route Frequency Provider Last Rate Last Admin   methylPREDNISolone sodium succinate (SOLU-MEDROL) 130 mg in sodium chloride 0.9 % 50 mL IVPB  130 mg Intravenous Once Jerline Pain, MD        Allergies  Allergen Reactions   Ceftriaxone Hives, Itching and Other (See Comments)   Erythromycin Anaphylaxis and Other (See Comments)   Iodinated Contrast Media Hives, Other (See Comments) and Shortness Of Breath   Amoxicillin-Pot Clavulanate Nausea And Vomiting    Nausea, vomiting, headache, dizziness. Hives on 08/2019 Other reaction(s): hives, Other Nausea, vomiting, headache, dizziness   pt states she can take other Penicillins just not augmentin Nausea, vomiting, headache, dizziness. Hives on 08/2019    Canagliflozin Rash and Other (See Comments)    Dehydration Dehydration Dehydration    Statins Other (See Comments)    Severe muscle pain/weakness Muscle pain/ weekness    Sulfa Antibiotics Other (See Comments)    blackout PER PT SHE BLACKS OUT    Cipro [Ciprofloxacin Hcl] Other (See Comments)   Corticosteroids Other (See Comments)   Insulin Glargine Other (See Comments)    Severe ankle swelling   Metformin And Related Diarrhea and Nausea And Vomiting    Violently ill for 2 yrs   Metformin Hcl Other (See Comments)   Prednisone Other (See Comments)    Abnormal blood sugars.Elyse Hsu [Shellfish Allergy] Diarrhea and Nausea And Vomiting    Severe GI upset   Tramadol Hcl Other (See Comments)   Nsaids Other (See Comments)    Stomach problems Stomach problems    Tramadol Rash   WUG:[id number] except as noted in HPI Back really bothering her today.      Objective:     BP 122/78    Pulse 78    Temp (!) 97.5 F (36.4 C) (Temporal)    Ht 5\' 5"  (1.651 m)    SpO2 98%    BMI  31.28 kg/m  Wt Readings from Last 3 Encounters:  03/17/21 188 lb (85.3 kg)  03/06/21 189 lb (85.7 kg)  02/20/21 190 lb (86.2 kg)        Gen: WDWN NAD WF-midly ill HEENT: NCAT, conjunctiva not injected, sclera nonicteric NECK:  supple, no thyromegaly, no nodes, no carotid bruits CARDIAC: RRR, S1S2+, no murmur. DP 2+B LUNGS: CTAB. No wheezes ABDOMEN:  BS+, soft, NTND, No HSM, no masses EXT:  no edema MSK: no gross abnormalities. Using cane NEURO: A&O  x3.  CN II-XII intact.  PSYCH: normal mood. Good eye contact  Reviewed ED records and chart for other visitis-76minutes  Assessment & Plan:   Problem List Items Addressed This Visit   None Visit Diagnoses     Colitis, acute    -  Primary   Relevant Orders   Ambulatory referral to Gastroenterology      Colitis-has had mult times as above-refer GI-? Other type of colitis.  Cont meds.  Declines labs today-pt is slowly improving.  Worse, etc ER.  Off work 2 days  No orders of the defined types were placed in this encounter.   Wellington Hampshire, MD

## 2021-03-20 NOTE — Patient Instructions (Signed)
Finish meds.  Off work for 2 days.  Referring to GI Worse, ER

## 2021-04-02 ENCOUNTER — Other Ambulatory Visit: Payer: Self-pay

## 2021-04-02 ENCOUNTER — Other Ambulatory Visit: Payer: Self-pay | Admitting: *Deleted

## 2021-04-02 ENCOUNTER — Encounter: Payer: Self-pay | Admitting: Family Medicine

## 2021-04-02 ENCOUNTER — Ambulatory Visit (INDEPENDENT_AMBULATORY_CARE_PROVIDER_SITE_OTHER): Payer: PPO | Admitting: Family Medicine

## 2021-04-02 ENCOUNTER — Encounter: Payer: Self-pay | Admitting: *Deleted

## 2021-04-02 VITALS — BP 110/70 | HR 80 | Temp 98.0°F | Ht 65.0 in

## 2021-04-02 DIAGNOSIS — K529 Noninfective gastroenteritis and colitis, unspecified: Secondary | ICD-10-CM

## 2021-04-02 LAB — COMPREHENSIVE METABOLIC PANEL
ALT: 9 U/L (ref 0–35)
AST: 13 U/L (ref 0–37)
Albumin: 4 g/dL (ref 3.5–5.2)
Alkaline Phosphatase: 67 U/L (ref 39–117)
BUN: 12 mg/dL (ref 6–23)
CO2: 31 mEq/L (ref 19–32)
Calcium: 9.6 mg/dL (ref 8.4–10.5)
Chloride: 98 mEq/L (ref 96–112)
Creatinine, Ser: 0.85 mg/dL (ref 0.40–1.20)
GFR: 68.62 mL/min (ref >60.00)
Glucose, Bld: 121 mg/dL — ABNORMAL HIGH (ref 70–99)
Potassium: 3.5 mEq/L (ref 3.5–5.1)
Sodium: 140 mEq/L (ref 135–145)
Total Bilirubin: 0.7 mg/dL (ref 0.2–1.2)
Total Protein: 6.7 g/dL (ref 6.0–8.3)

## 2021-04-02 LAB — CBC WITH DIFFERENTIAL/PLATELET
Basophils Absolute: 0.1 10*3/uL (ref 0.0–0.1)
Basophils Relative: 0.7 % (ref 0.0–3.0)
Eosinophils Absolute: 0.2 10*3/uL (ref 0.0–0.7)
Eosinophils Relative: 2.7 % (ref 0.0–5.0)
HCT: 42.2 % (ref 36.0–46.0)
Hemoglobin: 14.2 g/dL (ref 12.0–15.0)
Lymphocytes Relative: 29.6 % (ref 12.0–46.0)
Lymphs Abs: 2.5 10*3/uL (ref 0.7–4.0)
MCHC: 33.7 g/dL (ref 30.0–36.0)
MCV: 89.4 fl (ref 78.0–100.0)
Monocytes Absolute: 0.9 10*3/uL (ref 0.1–1.0)
Monocytes Relative: 10 % (ref 3.0–12.0)
Neutro Abs: 4.9 10*3/uL (ref 1.4–7.7)
Neutrophils Relative %: 57 % (ref 43.0–77.0)
Platelets: 282 10*3/uL (ref 150.0–400.0)
RBC: 4.73 Mil/uL (ref 3.87–5.11)
RDW: 13.5 % (ref 11.5–15.5)
WBC: 8.6 10*3/uL (ref 4.0–10.5)

## 2021-04-02 LAB — C-REACTIVE PROTEIN: CRP: <1 mg/dL (ref 0.5–20.0)

## 2021-04-02 LAB — SEDIMENTATION RATE: Sed Rate: 63 mm/hr — ABNORMAL HIGH (ref 0–30)

## 2021-04-02 MED ORDER — CIPROFLOXACIN HCL 500 MG PO TABS: 500.0000 mg | ORAL_TABLET | Freq: Two times a day (BID) | ORAL | 0 refills | Status: DC

## 2021-04-02 MED ORDER — DICYCLOMINE HCL 10 MG PO CAPS: 10.0000 mg | ORAL_CAPSULE | Freq: Three times a day (TID) | ORAL | 0 refills | Status: DC

## 2021-04-02 MED ORDER — METRONIDAZOLE 500 MG PO TABS: 500.0000 mg | ORAL_TABLET | Freq: Three times a day (TID) | ORAL | 0 refills | Status: DC

## 2021-04-02 NOTE — Patient Instructions (Signed)
It was very nice to see you today!  F/u GI   PLEASE NOTE:  If you had any lab tests please let us know if you have not heard back within a few days. You may see your results on MyChart before we have a chance to review them but we will give you a call once they are reviewed by [place]. If we ordered any referrals today, please let us know if you have not heard from their office within the next week.   Please try these tips to maintain a healthy lifestyle:  Eat most of your calories during the day when you are active. Eliminate processed foods including packaged sweets (pies, cakes, cookies), reduce intake of potatoes, white bread, white pasta, and white rice. Look for whole grain options, oat flour or almond flour.  Each meal should contain half fruits/vegetables, one quarter protein, and one quarter carbs (no bigger than a computer mouse).  Cut down on sweet beverages. This includes juice, soda, and sweet tea. Also watch fruit intake, though this is a healthier sweet option, it still contains natural sugar! Limit to 3 servings daily.  Drink at least 1 glass of water with each meal and aim for at least 8 glasses per day  Exercise at least 150 minutes every week.

## 2021-04-02 NOTE — Progress Notes (Signed)
Subjective:     Patient ID: [name], female    DOB: 1950-12-02, 71 y.o.   MRN: [id number]  Chief Complaint  Patient presents with   Abdominal Pain    Spasms Colitis not going away Having some constipation Did have a small bowel movement about 1 hour ago that eased some of the pain     HPI Still having issues w/colitis.  Spasms, pain.  Some constipation-small bm 1 hr ago eased pain some. Finished abx 2/18.  Vomiting and cramps improved.  Past few days, increased PO intake.  Bm's not back to normal-smaller caliper and shorter.   Getting bad cramps lower abd. No blood.  Some nausea.  Belching a lot.  In July-had to repeat tx.    No f/c.  Called GI-told to [facility] records and [place] "will decide if will see her".            Got hives 4 days after stopping abx.   Dr. Nicki Reaper Cunningham-is who the scheduler said needs to review Back-getting injection soon  Cscope 8/21-d/t diverticulitis-3 polyps  Steroids early Feb for sinuses.  Health Maintenance Due  Topic Date Due   FOOT EXAM  Never done   URINE MICROALBUMIN  Never done   Hepatitis C Screening  Never done   COLONOSCOPY (Pts 45-7yrs Insurance coverage will need to be confirmed)  Never done   MAMMOGRAM  Never done   Zoster Vaccines- Shingrix (1 of 2) Never done   Pneumonia Vaccine 93+ Years old (1 - PCV) Never done   DEXA SCAN  Never done   TETANUS/TDAP  07/27/2019    Past Medical History:  Diagnosis Date   Diabetes mellitus    Diverticulitis    Fibromyalgia    Hypercholesteremia    Hypertension    IBS (irritable bowel syndrome)    Migraines    Nasal fracture    Stein-Leventhal ovaries     Past Surgical History:  Procedure Laterality Date   CHOLECYSTECTOMY  1998   KNEE SURGERY Right 2017   LEFT HEART CATHETERIZATION WITH CORONARY ANGIOGRAM N/A 12/19/2011   Procedure: LEFT HEART CATHETERIZATION WITH CORONARY ANGIOGRAM;  Surgeon: Candee Furbish, MD;  Location: [facility] C[facility] LAB;  Service: Cardiovascular;  Laterality:  N/A;   [place]    Outpatient Medications Prior to Visit  Medication Sig Dispense Refill   ACCU-CHEK SMARTVIEW test strip USE AS INSTRUCTED TO TEST TWICE DAILY 100 each 4   acetaminophen (TYLENOL) 650 MG CR tablet 2 tablets as needed     acetaminophen-codeine (TYLENOL #3) 300-30 MG per tablet Take 1 tablet by mouth 2 (two) times daily as needed for severe pain.      cholecalciferol (VITAMIN D) 1000 UNITS tablet Take 1,000 Units by mouth every evening.      guaiFENesin-codeine 100-10 MG/5ML syrup Take 5 mLs by mouth 3 (three) times daily as needed for cough. 120 mL 0   hydrochlorothiazide (HYDRODIURIL) 50 MG tablet Take 0.5 tablets (25 mg total) by mouth 2 (two) times daily. 30 tablet 3   HYDROcodone-acetaminophen (NORCO/VICODIN) 5-325 MG tablet Take 1 tablet by mouth every 6 (six) hours as needed for severe pain. 8 tablet 0   insulin NPH Human (NOVOLIN N) 100 UNIT/ML injection Inject 0.3 mLs (30 Units total) into the skin 2 (two) times daily before a meal. 20 mL 0   insulin regular (NOVOLIN R RELION) 100 units/mL injection Inject 0.1-0.2 mLs (10-20 Units total) into the skin 3 (  three) times daily before meals. 20 mL 0   Lancets (ACCU-CHEK MULTICLIX) lancets Use as instructed to test once daily DX E11.65 100 each 4   metoprolol tartrate (LOPRESSOR) 50 MG tablet Take 0.5 tablets (25 mg total) by mouth 2 (two) times daily. 30 tablet 3   ondansetron (ZOFRAN-ODT) 4 MG disintegrating tablet Take 1 tablet (4 mg total) by mouth every 8 (eight) hours as needed for nausea or vomiting. 10 tablet 0   PRESCRIPTION MEDICATION Patient receives a Steroid injection in the neck once a year with Dr. Mina Marble at [facility] pt is unable to recall the exact name and i have been unable to verify name from the Dr's office. Patient also states " it shoots up my blood sugars really high, but i still get it because i can't take anything else for my neck pain"     Semaglutide,0.25 or  0.5MG /DOS, (OZEMPIC, 0.25 OR 0.5 MG/DOSE,) 2 MG/1.5ML SOPN Inject 0.5 mg into the skin once a week. 4.5 mL 3   tiZANidine (ZANAFLEX) 4 MG tablet 1 tablet as needed     ciprofloxacin (CIPRO) 500 MG tablet Take 1 tablet (500 mg total) by mouth 2 (two) times daily. 14 tablet 0   metroNIDAZOLE (FLAGYL) 500 MG tablet Take 1 tablet (500 mg total) by mouth 3 (three) times daily. 21 tablet 0   Facility-Administered Medications Prior to Visit  Medication Dose Route Frequency Provider Last Rate Last Admin   methylPREDNISolone sodium succinate (SOLU-MEDROL) 130 mg in sodium chloride 0.9 % 50 mL IVPB  130 mg Intravenous Once Jerline Pain, MD        Allergies  Allergen Reactions   Ceftriaxone Hives, Itching and Other (See Comments)   Erythromycin Anaphylaxis and Other (See Comments)   Iodinated Contrast Media Hives, Other (See Comments) and Shortness Of Breath   Amoxicillin-Pot Clavulanate Nausea And Vomiting    Nausea, vomiting, headache, dizziness. Hives on 08/2019 Other reaction(s): hives, Other Nausea, vomiting, headache, dizziness   pt states she can take other Penicillins just not augmentin Nausea, vomiting, headache, dizziness. Hives on 08/2019    Canagliflozin Rash and Other (See Comments)    Dehydration Dehydration Dehydration    Statins Other (See Comments)    Severe muscle pain/weakness Muscle pain/ weekness    Sulfa Antibiotics Other (See Comments)    blackout PER PT SHE BLACKS OUT    Cipro [Ciprofloxacin Hcl] Other (See Comments)   Corticosteroids Other (See Comments)   Insulin Glargine Other (See Comments)    Severe ankle swelling   Metformin And Related Diarrhea and Nausea And Vomiting    Violently ill for 2 yrs   Metformin Hcl Other (See Comments)   Prednisone Other (See Comments)    Abnormal blood sugars.Elyse Hsu [Shellfish Allergy] Diarrhea and Nausea And Vomiting    Severe GI upset   Tramadol Hcl Other (See Comments)   Nsaids Other (See Comments)    Stomach  problems Stomach problems    Tramadol Rash   ROS neg/noncontributory except as noted HPI/below      Objective:     BP 110/70    Pulse 80    Temp 98 F (36.7 C) (Temporal)    Ht 5\' 5"  (1.651 m)    SpO2 99%    BMI 31.28 kg/m  Wt Readings from Last 3 Encounters:  03/17/21 188 lb (85.3 kg)  03/06/21 189 lb (85.7 kg)  02/20/21 190 lb (86.2 kg)        Gen: WDWN  NAD [facility] HEENT: NCAT, conjunctiva not injected, sclera nonicteric NECK:  supple, no thyromegaly, no nodes, no carotid bruits CARDIAC: RRR, S1S2+, no murmur.  ABDOMEN:  BS+, soft, NTND, No HSM, no masses EXT:  no edema MSK: no gross abnormalities. Using cane NEURO: A&O x3.  CN II-XII intact.  PSYCH: normal mood. Good eye contact  Reviewed CT's from past 2 yrs.-sometimes more extensive colitis  Assessment & Plan:   Problem List Items Addressed This Visit   None Visit Diagnoses     Colitis, acute    -  Primary   Relevant Orders   CBC with Differential/Platelet   Comprehensive metabolic panel   Sedimentation rate   C-reactive protein   Clostridium Difficile by PCR      Colitis-not healing.  More of a pancolitis now per last CT and wbc were high(had had pred as well).  Check labs.  Will contact GI.  Needs to be evaluated as ?IBD,inflamm vs infectious.  Not sure how to proceed. Will do cipro/flagyl for now till I can reach GI.    Knows not to take tizanidine  Meds ordered this encounter  Medications   ciprofloxacin (CIPRO) 500 MG tablet    Sig: Take 1 tablet (500 mg total) by mouth 2 (two) times daily.    Dispense:  14 tablet    Refill:  0   metroNIDAZOLE (FLAGYL) 500 MG tablet    Sig: Take 1 tablet (500 mg total) by mouth 3 (three) times daily.    Dispense:  21 tablet    Refill:  0   dicyclomine (BENTYL) 10 MG capsule    Sig: Take 1 capsule (10 mg total) by mouth 4 (four) times daily -  before meals and at bedtime.    Dispense:  30 capsule    Refill:  0    Wellington Hampshire, MD

## 2021-04-04 DIAGNOSIS — K529 Noninfective gastroenteritis and colitis, unspecified: Secondary | ICD-10-CM | POA: Diagnosis not present

## 2021-04-06 LAB — CLOSTRIDIUM DIFFICILE BY PCR: Toxigenic C. Difficile by PCR: POSITIVE — AB

## 2021-04-08 ENCOUNTER — Other Ambulatory Visit: Payer: Self-pay | Admitting: Family Medicine

## 2021-04-08 DIAGNOSIS — A0472 Enterocolitis due to Clostridium difficile, not specified as recurrent: Secondary | ICD-10-CM

## 2021-04-08 MED ORDER — VANCOMYCIN HCL 125 MG PO CAPS: 125.0000 mg | ORAL_CAPSULE | Freq: Four times a day (QID) | ORAL | 0 refills | Status: AC

## 2021-04-09 ENCOUNTER — Telehealth: Payer: Self-pay | Admitting: Family Medicine

## 2021-04-09 NOTE — Telephone Encounter (Signed)
Returned call to patient. Discussed medication that was given for c-diff. ?

## 2021-04-09 NOTE — Telephone Encounter (Signed)
Pt is wanting to talk to someone about her med changes.  ?

## 2021-04-10 NOTE — Progress Notes (Deleted)
? ? ? ?04/10/2021 ?[name] ?[id number] ?December 20, 1950 ? ? ?ASSESSMENT AND PLAN:  ?*** ?There are no diagnoses linked to this encounter. ? ? ?Patient Care Team: ?Tawnya Crook, MD as PCP - General (Family Medicine) ? ?HISTORY OF PRESENT ILLNESS: ?71 y.o. female referred by Tawnya Crook, MD, with a past medical history of diverticulitis, migraines, hypertension, hyperlipidemia, diabetes insulin-dependent, status post cholecystectomy and others listed below presents for evaluation of colitis.  ?Saw Dr. Therisa Doyne previously in [place], last colonoscopy 09/2019 ?Patient seen in the ER for diverticulitis 03/17/2021, CT showed along transverse, descending, sigmoid colon without complication, white blood cell count 26,000, potassium 2.7.  Was sent in Cipro Flagyl.  Patient's also had diverticulitis 10/12/2020 , 08/09/2020,, 08/02/2019. ? ?Patient saw family medicine 04/02/2021 for ongoing abdominal discomfort and loose stools, found to be C. difficile positive, sent in vancomycin 125 mg 4 times daily for 10 days ? ?External labs and notes reviewed this visit: ?*** ? ?Current Medications:  ? ?Current Outpatient Medications (Endocrine & Metabolic):  ?  insulin NPH Human (NOVOLIN N) 100 UNIT/ML injection, Inject 0.3 mLs (30 Units total) into the skin 2 (two) times daily before a meal. ?  insulin regular (NOVOLIN R RELION) 100 units/mL injection, Inject 0.1-0.2 mLs (10-20 Units total) into the skin 3 (three) times daily before meals. ?  Semaglutide,0.25 or 0.'5MG'$ /DOS, (OZEMPIC, 0.25 OR 0.5 MG/DOSE,) 2 MG/1.5ML SOPN, Inject 0.5 mg into the skin once a week. ? ? ?Facility-Administered Medications Ordered in Other Visits (Endocrine & Metabolic):  ?  methylPREDNISolone sodium succinate (SOLU-MEDROL) 130 mg in sodium chloride 0.9 % 50 mL IVPB ? ?Current Outpatient Medications (Cardiovascular):  ?  hydrochlorothiazide (HYDRODIURIL) 50 MG tablet, Take 0.5 tablets (25 mg total) by mouth 2 (two) times daily. ?  metoprolol tartrate  (LOPRESSOR) 50 MG tablet, Take 0.5 tablets (25 mg total) by mouth 2 (two) times daily. ? ? ?Current Outpatient Medications (Respiratory):  ?  guaiFENesin-codeine 100-10 MG/5ML syrup, Take 5 mLs by mouth 3 (three) times daily as needed for cough. ? ? ?Current Outpatient Medications (Analgesics):  ?  acetaminophen (TYLENOL) 650 MG CR tablet, 2 tablets as needed ?  acetaminophen-codeine (TYLENOL #3) 300-30 MG per tablet, Take 1 tablet by mouth 2 (two) times daily as needed for severe pain.  ?  HYDROcodone-acetaminophen (NORCO/VICODIN) 5-325 MG tablet, Take 1 tablet by mouth every 6 (six) hours as needed for severe pain. ? ? ? ? ?Current Outpatient Medications (Other):  ?  ACCU-CHEK SMARTVIEW test strip, USE AS INSTRUCTED TO TEST TWICE DAILY ?  cholecalciferol (VITAMIN D) 1000 UNITS tablet, Take 1,000 Units by mouth every evening.  ?  ciprofloxacin (CIPRO) 500 MG tablet, Take 1 tablet (500 mg total) by mouth 2 (two) times daily. ?  Lancets (ACCU-CHEK MULTICLIX) lancets, Use as instructed to test once daily DX E11.65 ?  metroNIDAZOLE (FLAGYL) 500 MG tablet, Take 1 tablet (500 mg total) by mouth 3 (three) times daily. ?  ondansetron (ZOFRAN-ODT) 4 MG disintegrating tablet, Take 1 tablet (4 mg total) by mouth every 8 (eight) hours as needed for nausea or vomiting. ?  PRESCRIPTION MEDICATION, Patient receives a Steroid injection in the neck once a year with Dr. Mina Marble at [facility] pt is unable to recall the exact name and i have been unable to verify name from the Dr's office. Patient also states " it shoots up my blood sugars really high, but i still get it because i can't take anything else for my neck pain" ?  tiZANidine (ZANAFLEX) 4 MG tablet, 1 tablet as needed ?  vancomycin (VANCOCIN) 125 MG capsule, Take 1 capsule (125 mg total) by mouth 4 (four) times daily for 10 days. ? ?No current facility-administered medications for this visit. ? ?Medical History:  ?Past Medical History:  ?Diagnosis Date  ? Diabetes  mellitus   ? Diverticulitis   ? Fibromyalgia   ? Hypercholesteremia   ? Hypertension   ? IBS (irritable bowel syndrome)   ? Migraines   ? Nasal fracture   ? Stein-Leventhal ovaries   ? ?Allergies:  ?Allergies  ?Allergen Reactions  ? Ceftriaxone Hives, Itching and Other (See Comments)  ? Dicyclomine Hives  ?  Hives, itching, swelling, vomiting, felt like something was sitting on chest  ? Erythromycin Anaphylaxis and Other (See Comments)  ? Iodinated Contrast Media Hives, Other (See Comments) and Shortness Of Breath  ? Amoxicillin-Pot Clavulanate Nausea And Vomiting  ?  Nausea, vomiting, headache, dizziness. Hives on 08/2019 ?Other reaction(s): hives, Other ?Nausea, vomiting, headache, dizziness ? ? pt states she can take other Penicillins just not augmentin ?Nausea, vomiting, headache, dizziness. Hives on 08/2019 ?  ? Canagliflozin Rash and Other (See Comments)  ?  Dehydration ?Dehydration ?Dehydration ?  ? Statins Other (See Comments)  ?  Severe muscle pain/weakness ?Muscle pain/ weekness ?  ? Sulfa Antibiotics Other (See Comments)  ?  blackout ?PER PT SHE BLACKS OUT   ? Cipro [Ciprofloxacin Hcl] Other (See Comments)  ? Corticosteroids Other (See Comments)  ? Insulin Glargine Other (See Comments)  ?  Severe ankle swelling  ? Metformin And Related Diarrhea and Nausea And Vomiting  ?  Violently ill for 2 yrs  ? Metformin Hcl Other (See Comments)  ? Prednisone Other (See Comments)  ?  Abnormal blood sugars..  ? [place]all[place] [Shellfish Allergy] Diarrhea and Nausea And Vomiting  ?  Severe GI upset  ? Tramadol Hcl Other (See Comments)  ? Nsaids Other (See Comments)  ?  Stomach problems ?Stomach problems ?  ? Tramadol Rash  ?  ? ?Surgical History:  ?She  has a past surgical history that includes Shoulder surgery; Cholecystectomy (1998); Tonsillectomy (1959); left heart catheterization with coronary angiogram (N/A, 12/19/2011); and Knee surgery (Right, 2017). ?Family History:  ?Her family history includes Failure to thrive in  her mother; Heart failure in her father. She was adopted. ?Social History:  ? reports that she has never smoked. She has never used smokeless tobacco. She reports current alcohol use. She reports that she does not use drugs. ? ?REVIEW OF SYSTEMS  : All other systems reviewed and negative except where noted in the History of Present Illness. ? ? ?PHYSICAL EXAM: ?There were no vitals taken for this visit. ?General:   Pleasant, well developed female in no acute distress ?Head:  Normocephalic and atraumatic. ?Eyes: {sclerae:26738},conjunctive {conjuctiva:26739}  ?Heart:  {HEART EXAM HEM/ONC:21750} ?Pulm: Clear anteriorly; no wheezing ?Abdomen:  {BlankSingle:19197::"Distended","Ridged","Soft"}, {BlankSingle:19197::"Flat","Obese"} AB, skin exam {ABDOMEN SKIN EXAM:22649}, {BlankSingle:19197::"Absent","Hyperactive, tinkling","Hypoactive","Sluggish","Normal"} bowel sounds. {Desc; pc desc - abdomen tenderness:5168} tenderness {anatomy; site abdomen:5010}. {BlankMultiple:19196::"Without guarding","With guarding","Without rebound","With rebound"}, {Exam; abdomen organomegaly:15152}. ?Extremities:  {With/Without:304960234} edema. ?Msk:  Symmetrical without gross deformities. Peripheral pulses intact.  ?Neurologic:  Alert and  oriented x4;  grossly normal neurologically. ?Skin:   Dry and intact without significant lesions or rashes. ?Psychiatric: Demonstrates good judgement and reason without abnormal affect or behaviors. ? ? ?Vladimir Crofts, PA-C ?8:25 AM ? ? ?

## 2021-04-11 DIAGNOSIS — M5412 Radiculopathy, cervical region: Secondary | ICD-10-CM | POA: Diagnosis not present

## 2021-04-12 ENCOUNTER — Ambulatory Visit: Payer: PPO | Admitting: Physician Assistant

## 2021-04-23 ENCOUNTER — Telehealth: Payer: Self-pay | Admitting: Family Medicine

## 2021-04-23 ENCOUNTER — Telehealth: Payer: Self-pay

## 2021-04-23 DIAGNOSIS — E1165 Type 2 diabetes mellitus with hyperglycemia: Secondary | ICD-10-CM

## 2021-04-23 NOTE — Telephone Encounter (Signed)
Pt called regarding denial for Ozempic. Pt requested a new PA be submitted reflecting previously tried and failed alternatives.  ?Pt does not have any more Ozempic and a sample has been labeled for her to pick up. ?

## 2021-04-23 NOTE — Telephone Encounter (Signed)
Patient declined AWV, even after it was explained khc  ?

## 2021-04-24 ENCOUNTER — Other Ambulatory Visit (HOSPITAL_COMMUNITY): Payer: Self-pay

## 2021-04-24 MED ORDER — TRULICITY 1.5 MG/0.5ML ~~LOC~~ SOAJ: 1.5000 mg | SUBCUTANEOUS | 5 refills | Status: DC

## 2021-04-24 NOTE — Telephone Encounter (Signed)
Provider advised of covered alternatives and requested Trulicity 1.5 mg be sent to pt's preferred pharmacy.  ?Rx sent. ?Called and left a detailed message for pt advising of medication change.  ?

## 2021-04-25 ENCOUNTER — Ambulatory Visit: Payer: PPO | Admitting: Internal Medicine

## 2021-05-01 DIAGNOSIS — H5213 Myopia, bilateral: Secondary | ICD-10-CM | POA: Diagnosis not present

## 2021-05-01 DIAGNOSIS — H04123 Dry eye syndrome of bilateral lacrimal glands: Secondary | ICD-10-CM | POA: Diagnosis not present

## 2021-05-01 DIAGNOSIS — H5711 Ocular pain, right eye: Secondary | ICD-10-CM | POA: Diagnosis not present

## 2021-05-01 DIAGNOSIS — H2513 Age-related nuclear cataract, bilateral: Secondary | ICD-10-CM | POA: Diagnosis not present

## 2021-05-01 DIAGNOSIS — E119 Type 2 diabetes mellitus without complications: Secondary | ICD-10-CM | POA: Diagnosis not present

## 2021-05-07 ENCOUNTER — Ambulatory Visit (INDEPENDENT_AMBULATORY_CARE_PROVIDER_SITE_OTHER): Payer: PPO | Admitting: Family Medicine

## 2021-05-07 ENCOUNTER — Encounter: Payer: Self-pay | Admitting: Family Medicine

## 2021-05-07 VITALS — BP 116/70 | HR 73 | Temp 97.5°F | Ht 65.0 in

## 2021-05-07 DIAGNOSIS — A0472 Enterocolitis due to Clostridium difficile, not specified as recurrent: Secondary | ICD-10-CM | POA: Diagnosis not present

## 2021-05-07 DIAGNOSIS — K5100 Ulcerative (chronic) pancolitis without complications: Secondary | ICD-10-CM

## 2021-05-07 MED ORDER — VANCOMYCIN HCL 125 MG PO CAPS: 125.0000 mg | ORAL_CAPSULE | Freq: Four times a day (QID) | ORAL | 0 refills | Status: AC

## 2021-05-07 NOTE — Patient Instructions (Signed)
It was very nice to see you today! ? ?colitis ? ? ?PLEASE NOTE: ? ?If you had any lab tests please let us know if you have not heard back within a few days. You may see your results on MyChart before we have a chance to review them but we will give you a call once they are reviewed by [place]. If we ordered any referrals today, please let us know if you have not heard from their office within the next week.  ? ?Please try these tips to maintain a healthy lifestyle: ? ?Eat most of your calories during the day when you are active. Eliminate processed foods including packaged sweets (pies, cakes, cookies), reduce intake of potatoes, white bread, white pasta, and white rice. Look for whole grain options, oat flour or almond flour. ? ?Each meal should contain half fruits/vegetables, one quarter protein, and one quarter carbs (no bigger than a computer mouse). ? ?Cut down on sweet beverages. This includes juice, soda, and sweet tea. Also watch fruit intake, though this is a healthier sweet option, it still contains natural sugar! Limit to 3 servings daily. ? ?Drink at least 1 glass of water with each meal and aim for at least 8 glasses per day ? ?Exercise at least 150 minutes every week.   ?

## 2021-05-07 NOTE — Progress Notes (Signed)
? ?Subjective:  ? ? ? Patient ID: [name], female    DOB: May 14, 1950, 71 y.o.   MRN: [id number] ? ?Chief Complaint  ?Patient presents with  ? Abdominal Pain  ?  Colitis, still having abdominal cramping, finished medication ?  ? ? ?HPI ?The LLQ pain and spasms returned 05/04/21 and woke up w/pain.   Dull pain but sharper when has to have BM.  Finished vanco about 3/16.  Was doing better.  Bland diet.  But then ate [employer](but pain had already started.   No energy. No diarrhea. Stools soft.    Occ blood.  No f/c ?Sees GI in May.   ? ?Chronic LBP ? ?Chronic RLE edema ? ?Health Maintenance Due  ?Topic Date Due  ? FOOT EXAM  Never done  ? URINE MICROALBUMIN  Never done  ? Hepatitis C Screening  Never done  ? COLONOSCOPY (Pts 45-35yr Insurance coverage will need to be confirmed)  Never done  ? MAMMOGRAM  Never done  ? Zoster Vaccines- Shingrix (1 of 2) Never done  ? Pneumonia Vaccine 72 Years old (1 - PCV) Never done  ? DEXA SCAN  Never done  ? TETANUS/TDAP  07/27/2019  ? OPHTHALMOLOGY EXAM  04/28/2021  ? ? ?Past Medical History:  ?Diagnosis Date  ? Diabetes mellitus   ? Diverticulitis   ? Fibromyalgia   ? Hypercholesteremia   ? Hypertension   ? IBS (irritable bowel syndrome)   ? Migraines   ? Nasal fracture   ? Stein-Leventhal ovaries   ? ? ?Past Surgical History:  ?Procedure Laterality Date  ? CHOLECYSTECTOMY  1998  ? KNEE SURGERY Right 2017  ? LEFT HEART CATHETERIZATION WITH CORONARY ANGIOGRAM N/A 12/19/2011  ? Procedure: LEFT HEART CATHETERIZATION WITH CORONARY ANGIOGRAM;  Surgeon: MCandee Furbish MD;  [facility];  Service: Cardiovascular;  Laterality: N/A;  ? SHOULDER SURGERY    ? TONSILLECTOMY  1959  ? ? ?Outpatient Medications Prior to Visit  ?Medication Sig Dispense Refill  ? ACCU-CHEK SMARTVIEW test strip USE AS INSTRUCTED TO TEST TWICE DAILY 100 each 4  ? acetaminophen (TYLENOL) 650 MG CR tablet 2 tablets as needed    ? acetaminophen-codeine (TYLENOL #3) 300-30 MG per tablet Take 1 tablet by  mouth 2 (two) times daily as needed for severe pain.     ? cholecalciferol (VITAMIN D) 1000 UNITS tablet Take 1,000 Units by mouth every evening.     ? Dulaglutide (TRULICITY) 1.5 MFW/2.6VZSOPN Inject 1.5 mg into the skin once a week. 2 mL 5  ? hydrochlorothiazide (HYDRODIURIL) 50 MG tablet Take 0.5 tablets (25 mg total) by mouth 2 (two) times daily. 30 tablet 3  ? HYDROcodone-acetaminophen (NORCO/VICODIN) 5-325 MG tablet Take 1 tablet by mouth every 6 (six) hours as needed for severe pain. 8 tablet 0  ? insulin NPH Human (NOVOLIN N) 100 UNIT/ML injection Inject 0.3 mLs (30 Units total) into the skin 2 (two) times daily before a meal. 20 mL 0  ? insulin regular (NOVOLIN R RELION) 100 units/mL injection Inject 0.1-0.2 mLs (10-20 Units total) into the skin 3 (three) times daily before meals. 20 mL 0  ? Lancets (ACCU-CHEK MULTICLIX) lancets Use as instructed to test once daily DX E11.65 100 each 4  ? metoprolol tartrate (LOPRESSOR) 50 MG tablet Take 0.5 tablets (25 mg total) by mouth 2 (two) times daily. 30 tablet 3  ? metroNIDAZOLE (FLAGYL) 500 MG tablet Take 1 tablet (500 mg total) by mouth 3 (three) times daily.  21 tablet 0  ? ondansetron (ZOFRAN-ODT) 4 MG disintegrating tablet Take 1 tablet (4 mg total) by mouth every 8 (eight) hours as needed for nausea or vomiting. 10 tablet 0  ? PRESCRIPTION MEDICATION Patient receives a Steroid injection in the neck once a year with Dr. Mina [facility]ble at [facility] pt is unable to recall the exact name and i have been unable to verify name from the Dr's office. Patient also states " it shoots up my blood sugars really high, but i still get it because i can't take anything else for my neck pain"    ? tiZANidine (ZANAFLEX) 4 MG tablet 1 tablet as needed    ? guaiFENesin-codeine 100-10 MG/5ML syrup Take 5 mLs by mouth 3 (three) times daily as needed for cough. (Patient not taking: Reported on 05/07/2021) 120 mL 0  ? ciprofloxacin (CIPRO) 500 MG tablet Take 1 tablet (500 mg total) by  mouth 2 (two) times daily. 14 tablet 0  ? ?Facility-Administered Medications Prior to Visit  ?Medication Dose Route Frequency Provider Last Rate Last Admin  ? methylPREDNISolone sodium succinate (SOLU-MEDROL) 130 mg in sodium chloride 0.9 % 50 mL IVPB  130 mg Intravenous Once Jerline Pain, MD      ? ? ?Allergies  ?Allergen Reactions  ? Ceftriaxone Hives, Itching and Other (See Comments)  ? Dicyclomine Hives  ?  Hives, itching, swelling, vomiting, felt like something was sitting on chest  ? Erythromycin Anaphylaxis and Other (See Comments)  ? Iodinated Contrast Media Hives, Other (See Comments) and Shortness Of Breath  ? Amoxicillin-Pot Clavulanate Nausea And Vomiting  ?  Nausea, vomiting, headache, dizziness. Hives on 08/2019 ?Other reaction(s): hives, Other ?Nausea, vomiting, headache, dizziness ? ? pt states she can take other Penicillins just not augmentin ?Nausea, vomiting, headache, dizziness. Hives on 08/2019 ?  ? Canagliflozin Rash and Other (See Comments)  ?  Dehydration ?Dehydration ?Dehydration ?  ? Statins Other (See Comments)  ?  Severe muscle pain/weakness ?Muscle pain/ weekness ?  ? Sulfa Antibiotics Other (See Comments)  ?  blackout ?PER PT SHE BLACKS OUT   ? Cipro [Ciprofloxacin Hcl] Other (See Comments)  ? Corticosteroids Other (See Comments)  ? Insulin Glargine Other (See Comments)  ?  Severe ankle swelling  ? Metformin And Related Diarrhea and Nausea And Vomiting  ?  Violently ill for 2 yrs  ? Metformin Hcl Other (See Comments)  ? Prednisone Other (See Comments)  ?  Abnormal blood sugars..  ? Scallops [Shellfish Allergy] Diarrhea and Nausea And Vomiting  ?  Severe GI upset  ? Tramadol Hcl Other (See Comments)  ? Nsaids Other (See Comments)  ?  Stomach problems ?Stomach problems ?  ? Tramadol Rash  ? ?ROS neg/noncontributory except as noted HPI/below ? ? ?   ?Objective:  ?  ? ?BP 116/70   Pulse 73   Temp (!) 97.5 ?F (36.4 ?C) (Temporal)   Ht '5\' 5"'$  (1.651 m)   SpO2 98%   BMI 31.28 kg/m?  ?Wt  Readings from Last 3 Encounters:  ?03/17/21 188 lb (85.3 kg)  ?03/06/21 189 lb (85.7 kg)  ?02/20/21 190 lb (86.2 kg)  ? ? ?Physical Exam  ? ?Gen: WDWN NAD ?HEENT: NCAT, conjunctiva not injected, sclera nonicteric ?ABDOMEN:  BS+, soft, + tender LLQ worse than LUQ.  No HSM, no masses ?EXT:  chronic edema ?MSK: no gross abnormalities.  ?NEURO: A&O x3.  CN II-XII intact.  ?PSYCH: normal mood. Good eye contact ? ?   ?Assessment &  Plan:  ? ?Problem List Items Addressed This Visit   ? ?  ? Digestive  ? Pancolitis ([place])  ? ?Other Visit Diagnoses   ? ? C. difficile colitis    -  Primary  ? Relevant Medications  ? vancomycin (VANCOCIN) 125 MG capsule  ? ?  ? C diff colitis-not sure if recurrance, chronic colitis.  But after abx, better, but when off, returns.  Call GI to see if any cancellations.  Will tx 14 more days of vanco(may need taper).  If increase pain, f/c/n/v/bloody stools-to ER.  ?Colitis-etiol unclear.  Compounded by c diff.  ? If underlying  UC, Crohn's, other.  Sees GI in May.  ? ?Meds ordered this encounter  ?Medications  ? vancomycin (VANCOCIN) 125 MG capsule  ?  Sig: Take 1 capsule (125 mg total) by mouth 4 (four) times daily for 14 days.  ?  Dispense:  56 capsule  ?  Refill:  0  ? ? ?Wellington Hampshire, MD ? ?

## 2021-05-22 ENCOUNTER — Ambulatory Visit (INDEPENDENT_AMBULATORY_CARE_PROVIDER_SITE_OTHER): Payer: PPO | Admitting: Family Medicine

## 2021-05-22 ENCOUNTER — Encounter: Payer: Self-pay | Admitting: Family Medicine

## 2021-05-22 VITALS — BP 140/82 | HR 87 | Temp 98.0°F | Ht 65.0 in

## 2021-05-22 DIAGNOSIS — J069 Acute upper respiratory infection, unspecified: Secondary | ICD-10-CM

## 2021-05-22 MED ORDER — GUAIFENESIN-CODEINE 100-10 MG/5ML PO SOLN: 5.0000 mL | Freq: Three times a day (TID) | ORAL | 0 refills | Status: DC | PRN

## 2021-05-22 MED ORDER — PREDNISONE 20 MG PO TABS: 40.0000 mg | ORAL_TABLET | Freq: Every day | ORAL | 0 refills | Status: AC

## 2021-05-22 NOTE — Patient Instructions (Signed)
Meds have been sent the the pharmacy °You can take tylenol for pain/fevers °If worsening symptoms, let us know or go to the Emergency room  ° ° °

## 2021-05-22 NOTE — Progress Notes (Signed)
? ?Subjective:  ? ? ? Patient ID: [name], female    DOB: April 05, 1950, 71 y.o.   MRN: [id number] ? ?Chief Complaint  ?Patient presents with  ? Cough  ?  Painful productive cough ?Sx started Saturday ? ?  ? Raspy Voice  ? Nasal Congestion  ? Generalized Body Aches  ? ? ?HPI ?Chief complaint: cough ?Symptom onset: 4/15 ?Pertinent positives: raspy voice, congestion, achey, coughing.  Temp 100.4 on 4/15 only.  ?Pertinent negatives: no sob ?Treatments tried: corecedin ?Vaccine status: utd ?Sick exposure: co-workers ? ?Health Maintenance Due  ?Topic Date Due  ? FOOT EXAM  Never done  ? URINE MICROALBUMIN  Never done  ? Hepatitis C Screening  Never done  ? COLONOSCOPY (Pts 45-85yr Insurance coverage will need to be confirmed)  Never done  ? MAMMOGRAM  Never done  ? Zoster Vaccines- Shingrix (1 of 2) Never done  ? Pneumonia Vaccine 72 Years old (1 - PCV) Never done  ? DEXA SCAN  Never done  ? TETANUS/TDAP  07/27/2019  ? OPHTHALMOLOGY EXAM  04/28/2021  ? ? ?Past Medical History:  ?Diagnosis Date  ? Diabetes mellitus   ? Diverticulitis   ? Fibromyalgia   ? Hypercholesteremia   ? Hypertension   ? IBS (irritable bowel syndrome)   ? Migraines   ? Nasal fracture   ? Stein-Leventhal ovaries   ? ? ?Past Surgical History:  ?Procedure Laterality Date  ? CHOLECYSTECTOMY  1998  ? KNEE SURGERY Right 2017  ? LEFT HEART CATHETERIZATION WITH CORONARY ANGIOGRAM N/A 12/19/2011  ? Procedure: LEFT HEART CATHETERIZATION WITH CORONARY ANGIOGRAM;  Surgeon: MCandee Furbish MD;  [facility];  Service: Cardiovascular;  Laterality: N/A;  ? SHOULDER SURGERY    ? TONSILLECTOMY  1959  ? ? ?Outpatient Medications Prior to Visit  ?Medication Sig Dispense Refill  ? ACCU-CHEK SMARTVIEW test strip USE AS INSTRUCTED TO TEST TWICE DAILY 100 each 4  ? acetaminophen (TYLENOL) 650 MG CR tablet 2 tablets as needed    ? acetaminophen-codeine (TYLENOL #3) 300-30 MG per tablet Take 1 tablet by mouth 2 (two) times daily as needed for severe pain.     ?  cholecalciferol (VITAMIN D) 1000 UNITS tablet Take 1,000 Units by mouth every evening.     ? Dulaglutide (TRULICITY) 1.5 MFI/4.3PISOPN Inject 1.5 mg into the skin once a week. 2 mL 5  ? hydrochlorothiazide (HYDRODIURIL) 50 MG tablet Take 0.5 tablets (25 mg total) by mouth 2 (two) times daily. 30 tablet 3  ? HYDROcodone-acetaminophen (NORCO/VICODIN) 5-325 MG tablet Take 1 tablet by mouth every 6 (six) hours as needed for severe pain. 8 tablet 0  ? insulin NPH Human (NOVOLIN N) 100 UNIT/ML injection Inject 0.3 mLs (30 Units total) into the skin 2 (two) times daily before a meal. 20 mL 0  ? insulin regular (NOVOLIN R RELION) 100 units/mL injection Inject 0.1-0.2 mLs (10-20 Units total) into the skin 3 (three) times daily before meals. 20 mL 0  ? Lancets (ACCU-CHEK MULTICLIX) lancets Use as instructed to test once daily DX E11.65 100 each 4  ? metoprolol tartrate (LOPRESSOR) 50 MG tablet Take 0.5 tablets (25 mg total) by mouth 2 (two) times daily. 30 tablet 3  ? PRESCRIPTION MEDICATION Patient receives a Steroid injection in [facility] neck [facility] a [facility] exact [facility] and i have been unable to verify name from the Dr's office. Patient also states " it  shoots up my blood sugars really high, but i still get it because i can't take anything else for my neck pain"    ? tiZANidine (ZANAFLEX) 4 MG tablet 1 tablet as needed    ? vancomycin (VANCOCIN) 125 MG capsule Take 125 mg by mouth 4 (four) times daily.    ? guaiFENesin-codeine 100-10 MG/5ML syrup Take 5 mLs by mouth 3 (three) times daily as needed for cough. (Patient not taking: Reported on 05/22/2021) 120 mL 0  ? metroNIDAZOLE (FLAGYL) 500 MG tablet Take 1 tablet (500 mg total) by mouth 3 (three) times daily. (Patient not taking: Reported on 05/22/2021) 21 tablet 0  ? ondansetron (ZOFRAN-ODT) 4 MG disintegrating tablet Take 1 tablet (4 mg total) by mouth every 8 (eight) hours as needed for nausea or vomiting. (Patient  not taking: Reported on 05/22/2021) 10 tablet 0  ? ?Facility-Administered Medications Prior to Visit  ?Medication Dose Route Frequency Provider Last Rate Last Admin  ? methylPREDNISolone sodium succinate (SOLU-MEDROL) 130 mg in sodium chloride 0.9 % 50 mL IVPB  130 mg Intravenous Once Jerline Pain, MD      ? ? ?Allergies  ?Allergen Reactions  ? Ceftriaxone Hives, Itching and Other (See Comments)  ? Dicyclomine Hives  ?  Hives, itching, swelling, vomiting, felt like something was sitting on chest  ? Erythromycin Anaphylaxis and Other (See Comments)  ? Iodinated Contrast Media Hives, Other (See Comments) and Shortness Of Breath  ? Amoxicillin-Pot Clavulanate Nausea And Vomiting  ?  Nausea, vomiting, headache, dizziness. Hives on 08/2019 ?Other reaction(s): hives, Other ?Nausea, vomiting, headache, dizziness ? ? pt states she can take other Penicillins just not augmentin ?Nausea, vomiting, headache, dizziness. Hives on 08/2019 ?  ? Canagliflozin Rash and Other (See Comments)  ?  Dehydration ?Dehydration ?Dehydration ?  ? Statins Other (See Comments)  ?  Severe muscle pain/weakness ?Muscle pain/ weekness ?  ? Sulfa Antibiotics Other (See Comments)  ?  blackout ?PER PT SHE BLACKS OUT   ? Cipro [Ciprofloxacin Hcl] Other (See Comments)  ? Corticosteroids Other (See Comments)  ? Insulin Glargine Other (See Comments)  ?  Severe ankle swelling  ? Metformin And Related Diarrhea and Nausea And Vomiting  ?  Violently ill for 2 yrs  ? Metformin Hcl Other (See Comments)  ? Prednisone Other (See Comments)  ?  Abnormal blood sugars..  ? Scallops [Shellfish Allergy] Diarrhea and Nausea And Vomiting  ?  Severe GI upset  ? Tramadol Hcl Other (See Comments)  ? Nsaids Other (See Comments)  ?  Stomach problems ?Stomach problems ?  ? Tramadol Rash  ? ?ROS neg/noncontributory except as noted HPI/below ?Still a lot of chronic abd pain esp some foods.  ? ? ?   ?Objective:  ?  ? ?BP 140/82   Pulse 87   Temp 98 ?F (36.7 ?C) (Temporal)   Ht  '5\' 5"'$  (1.651 m)   SpO2 99%   BMI 31.28 kg/m?  ?Wt Readings from Last 3 Encounters:  ?03/17/21 188 lb (85.3 kg)  ?03/06/21 189 lb (85.7 kg)  ?02/20/21 190 lb (86.2 kg)  ? ? ?Physical Exam  ? ?Gen: WDWN NAD ?HEENT: NCAT, conjunctiva not injected, sclera nonicteric ?TM WNL B, OP moist, no exudates .  Some congested ?NECK:  supple, no thyromegaly, no nodes, no carotid bruits ?CARDIAC: RRR, S1S2+, no murmur.  ?LUNGS: CTAB. No wheezes ?EXT:  no edema ?MSK: no gross abnormalities.  ?NEURO: A&O x3.  CN II-XII intact.  ?PSYCH: normal mood. Good  eye contact ? ?   ?Assessment & Plan:  ? ?Problem List Items Addressed This Visit   ?None ?Visit Diagnoses   ? ? Viral upper respiratory tract infection    -  Primary  ? Relevant Medications  ? vancomycin (VANCOCIN) 125 MG capsule  ? ?  ? URI-suspect viral and pt w/ c diff and mult drug allergies.  Will do pred and codeine cough syrup.  If worse, new symptoms, etc, then call for amox ? ?No orders of the defined types were placed in this encounter. ? ? ?Wellington Hampshire, MD ? ?

## 2021-06-14 DIAGNOSIS — M47816 Spondylosis without myelopathy or radiculopathy, lumbar region: Secondary | ICD-10-CM | POA: Diagnosis not present

## 2021-06-19 ENCOUNTER — Ambulatory Visit: Payer: PPO | Admitting: Gastroenterology

## 2021-06-19 ENCOUNTER — Encounter: Payer: Self-pay | Admitting: Gastroenterology

## 2021-06-19 VITALS — BP 124/82 | HR 94 | Ht 64.25 in

## 2021-06-19 DIAGNOSIS — Z8619 Personal history of other infectious and parasitic diseases: Secondary | ICD-10-CM

## 2021-06-19 DIAGNOSIS — R1032 Left lower quadrant pain: Secondary | ICD-10-CM

## 2021-06-19 DIAGNOSIS — R140 Abdominal distension (gaseous): Secondary | ICD-10-CM | POA: Diagnosis not present

## 2021-06-19 NOTE — Patient Instructions (Addendum)
If you are age 72 or older, your body mass index should be between 23-30. Your Body mass index is 32.02 kg/m?Marland Kitchen If this is out of the aforementioned range listed, please consider follow up with your Primary Care Provider. ? ?If you are age 72 or younger, your body mass index should be between 19-25. Your Body mass index is 32.02 kg/m?Marland Kitchen If this is out of the aformentioned range listed, please consider follow up with your Primary Care Provider.  ? ?________________________________________________________ ? ?The [facility] providers would like to encourage you to use [facility] to communicate with providers for non-urgent requests or questions.  Due to long hold times on the telephone, sending your provider a message by [facility] may be a faster and more efficient way to get a response.  Please allow 48 business hours for a response.  Please remember that this is for non-urgent requests.  ? ? ?Food Guidelines for those with chronic digestive trouble: ? ?Many people have difficulty digesting certain foods, causing a variety of distressing and embarrassing symptoms such as abdominal pain, bloating and gas.  These foods may need to be avoided or consumed in small amounts.  Here are some tips that might be helpful for you. ? ?1.   Lactose intolerance is the difficulty or complete inability to digest lactose, the natural sugar in milk and anything made from milk.  This condition is harmless, common, and can begin any time during life.  Some people can digest a modest amount of lactose while others cannot tolerate any.  Also, not all dairy products contain equal amounts of lactose.  For example, hard cheeses such as parmesan have less lactose than soft cheeses such as cheddar.  Yogurt has less lactose than milk or cheese.  Many packaged foods (even many brands of bread) have milk, so read ingredient lists carefully.  It is difficult to test for lactose intolerance, so just try avoiding lactose as much as possible for a week and  see what happens with your symptoms.  If you seem to be lactose intolerant, the best plan is to avoid it (but make sure you get calcium from another source).  The next best thing is to use lactase enzyme supplements, available over the counter everywhere.  Just know that many lactose intolerant people need to take several tablets with each serving of dairy to avoid symptoms.  Lastly, a lot of restaurant food is made with milk or butter.  Many are things you might not suspect, such as mashed potatoes, rice and pasta (cooked with butter) and "grilled" items.  If you are lactose intolerant, it never hurts to ask your server what has milk or butter. ? ?2.   Fiber is an important part of your diet, but not all fiber is well-tolerated.  Insoluble fiber such as bran is often consumed by normal gut bacteria and converted into gas.  Soluble fiber such as oats, squash, carrots and green beans are typically tolerated better. ? ?3.   Some types of carbohydrates can be poorly digested.  Examples include: fructose (apples, cherries, pears, raisins and other dried fruits), fructans (onions, zucchini, large amounts of wheat), sorbitol/mannitol/xylitol and sucralose/Splenda (common artificial sweeteners), and raffinose (lentils, broccoli, cabbage, asparagus, brussel sprouts, many types of beans).  ?Do a Development worker, community for FODMAP diet and you will find helpful information. ?Beano, a dietary supplement, will often help with raffinose-containing foods.  As with lactase tablets, you may need several per serving. ? ?4.   Whenever possible, avoid processed  food&meats and chemical additives.  High fructose corn syrup, a common sweetener, may be difficult to digest.  Eggs and soy (comes from the soybean, and added to many foods now) are other common bloating/gassy foods. ? ?5.  Regarding gluten:  gluten is a protein mainly found in wheat, but also rye and barley.  There is a condition called celiac sprue, which is an inflammatory reaction in  the small intestine causing a variety of digestive symptoms.  Blood testing is highly reliable to look for this condition, and sometimes upper endoscopy with small bowel biopsies may be necessary to make the diagnosis.  Many patients who test negative for celiac sprue report improvement in their digestive symptoms when they switch to a gluten-free diet.  However, in these "non-celiac gluten sensitive" patients, the true role of gluten in their symptoms is unclear.  Reducing carbohydrates in general may decrease the gas and bloating caused when gut bacteria consume carbs. Also, some of these patients may actually be intolerant of the baker's yeast in bread products rather than the gluten.  Flatbread and other reduced yeast breads might therefore be tolerated.  There is no specific testing available for most food intolerances, which are discovered mainly by dietary elimination.  Please do not embark on a gluten free diet unless directed by your doctor, as it is highly restrictive, and may lead to nutritional deficiencies if not carefully monitored.  Lastly, beware of internet claims offering "personalized" tests for food intolerances.  Such testing has no reliable scientific evidence to support its reliability and correlation to symptoms.   ? ?6.  The best advice is old advice, especially for those with chronic digestive trouble - try to eat "clean".  Balanced diet, avoid processed food, plenty of fruits and vegetables, cut down the sugar, minimal alcohol, avoid tobacco. ?Make time to care for yourself, get enough sleep, exercise when you can, reduce stress.  Your guts will thank you for it. ? ? ?- Dr. Wilfrid Lund ?[facility] ? ?____________________________________________________________ ? ? ? ? ?

## 2021-06-19 NOTE — Progress Notes (Signed)
? ? ?[facility] Consult Note: ? ?History: ?[name] ?06/19/2021 ? ?Referring provider: Tawnya Crook, MD ? ?Reason for consult/chief complaint: C diff colitis (ED visit in Feb), Bloated, and Abdominal Pain (LLQ pain ) ? ? ?Subjective  ?HPI: ? ?[name] was referred by primary care for abdominal pain and history of C. difficile. ?ED visit February 11 (records reviewed), presented with acute onset diffuse abdominal pain, marked leukocytosis and noncontrast CT scan (IV dye allergy) with long segment colitis transverse descending sigmoid.  She was treated with ciprofloxacin and metronidazole, reportedly similar treatment given for diverticulitis during ED visits in September and July 2022. ?Primary care saw her soon afterward, patient was not improved and was having diarrhea, C. difficile PCR positive on March 1.  Treated with vancomycin ?Primary care visit March 3 indicates patient felt better on vancomycin, then had return of abdominal cramps, so was given another 14-day course of vancomycin without further stool testing. ? ?[name] says she has had multiple episodes of diverticulitis in the past and saw Dr. Therisa Doyne at [facility] for her first screening colonoscopy in 2021.  She believes there may have been 3 polyps, those results are not available today.  She was originally referred to that physician because she had an [facility] primary care provider at the time.  She has since establish care with a [facility] PCP and was referred to [place] for her recent ongoing problems. ?She would get sudden onset crampy severe lower abdominal pain with nausea during the diverticulitis episodes.  The episode in February was much worse, she felt more ill and also had a loose bowel movement.  She may have had another after returning home that day, but says it was no subsequent diarrhea.  She was having lower abdominal crampy pain bloating and nausea when she saw her physician for both of the visits noted above.  However, she  was not having chronic diarrhea and still does not.  At this point she has 3-4 formed stools per day, and is increasingly bothered by a year or so of urgency for bowel movements. ?She does indicate that the vancomycin given for the positive C.  Difficile PCR improved her cramps and nausea both times. ?She was more recently given a trial of dicyclomine but says her rash and other side effects. ?[name] also reports intermittent bloating and gas, worse in the last couple of years and worse if she eats fast food like [employer] breakfast she had today. ? ?(Note lengthy list of medicine allergies/intolerances) ? ?ROS: ? ?Review of Systems  ?Constitutional:  Negative for appetite change and unexpected weight change.  ?HENT:  Negative for mouth sores and voice change.   ?Eyes:  Negative for pain and redness.  ?Respiratory:  Negative for cough and shortness of breath.   ?Cardiovascular:  Negative for chest pain and palpitations.  ?Genitourinary:  Negative for dysuria and hematuria.  ?Musculoskeletal:  Positive for arthralgias and back pain. Negative for myalgias.  ?Skin:  Negative for pallor and rash.  ?Neurological:  Positive for headaches. Negative for weakness.  ?Hematological:  Negative for adenopathy.  ? ? ?Past Medical History: ?Past Medical History:  ?Diagnosis Date  ? Arthritis   ? C. difficile colitis   ? Diabetes mellitus   ? Diverticulitis   ? Fibromyalgia   ? Hypercholesteremia   ? Hypertension   ? IBS (irritable bowel syndrome)   ? Migraines   ? Nasal fracture   ? Stein-Leventhal ovaries   ? ? ? ?Past Surgical History: ?  Past Surgical History:  ?Procedure Laterality Date  ? CHOLECYSTECTOMY  1998  ? KNEE ARTHROSCOPY Right 2017  ? KNEE ARTHROSCOPY Left   ? LEFT HEART CATHETERIZATION WITH CORONARY ANGIOGRAM N/A 12/19/2011  ? Procedure: LEFT HEART CATHETERIZATION WITH CORONARY ANGIOGRAM;  Surgeon: Candee Furbish, MD;  Location: [facility] CATH LAB;  Service: Cardiovascular;  Laterality: N/A;  ? ROTATOR CUFF REPAIR Right   ?  TONSILLECTOMY  1959  ? ? ? ?Family History: ?Family History  ?Adopted: Yes  ?Problem Relation Age of Onset  ? Failure to thrive Mother   ? Heart failure Father   ? Neuropathy Neg Hx   ? ? ?Social History: ?Social History  ? ?Socioeconomic History  ? Marital status: Single  ?  Spouse name: Not on file  ? Number of children: 0  ? Years of education: Some college  ? Highest education level: Not on file  ?Occupational History  ? Occupation: accounting  ?  Comment: [employer]  ?Tobacco Use  ? Smoking status: Never  ? Smokeless tobacco: Never  ?Vaping Use  ? Vaping Use: Never used  ?Substance and Sexual Activity  ? Alcohol use: Yes  ?  Comment: rare-once a year at a celebration  ? Drug use: No  ? Sexual activity: Not on file  ?Other Topics Concern  ? Not on file  ?Social History Narrative  ? Lives alone w/ her 3 cats  ? Right-handed  ? Caffeine: 2 cups coffee/tea per day  ?   ? Adoptive Dad was 1/2 bro to pt birth mother  ? ?Social Determinants of Health  ? ?Financial Resource Strain: Not on file  ?Food Insecurity: Not on file  ?Transportation Needs: Not on file  ?Physical Activity: Not on file  ?Stress: Not on file  ?Social Connections: Not on file  ? ? ?Allergies: ?Allergies  ?Allergen Reactions  ? Ceftriaxone Hives, Itching and Other (See Comments)  ? Dicyclomine Hives  ?  Hives, itching, swelling, vomiting, felt like something was sitting on chest  ? Erythromycin Anaphylaxis and Other (See Comments)  ? Iodinated Contrast Media Hives, Other (See Comments) and Shortness Of Breath  ? Amoxicillin-Pot Clavulanate Nausea And Vomiting  ?  Nausea, vomiting, headache, dizziness. Hives on 08/2019 ?Other reaction(s): hives, Other ?Nausea, vomiting, headache, dizziness ? ? pt states she can take other Penicillins just not augmentin ?Nausea, vomiting, headache, dizziness. Hives on 08/2019 ?  ? Canagliflozin Rash and Other (See Comments)  ?  Dehydration ?Dehydration ?Dehydration ?  ? Statins Other (See Comments)  ?  Severe  muscle pain/weakness ?Muscle pain/ weekness ?  ? Sulfa Antibiotics Other (See Comments)  ?  blackout ?PER PT SHE BLACKS OUT   ? Cipro [Ciprofloxacin Hcl] Other (See Comments)  ? Corticosteroids Other (See Comments)  ? Insulin Glargine Other (See Comments)  ?  Severe ankle swelling  ? Metformin And Related Diarrhea and Nausea And Vomiting  ?  Violently ill for 2 yrs  ? Metformin Hcl Other (See Comments)  ? Prednisone Other (See Comments)  ?  Abnormal blood sugars..  ? Scallops [Shellfish Allergy] Diarrhea and Nausea And Vomiting  ?  Severe GI upset  ? Tramadol Hcl Other (See Comments)  ? Nsaids Other (See Comments)  ?  Stomach problems ?Stomach problems ?  ? Tramadol Rash  ? ? ?Outpatient Meds: ?Current Outpatient Medications  ?Medication Sig Dispense Refill  ? ACCU-CHEK SMARTVIEW test strip USE AS INSTRUCTED TO TEST TWICE DAILY 100 each 4  ? acetaminophen (TYLENOL) 650 MG CR  tablet 2 tablets as needed    ? acetaminophen-codeine (TYLENOL #3) 300-30 MG per tablet Take 1 tablet by mouth 2 (two) times daily as needed for severe pain.     ? cholecalciferol (VITAMIN D) 1000 UNITS tablet Take 1,000 Units by mouth every evening.     ? Dulaglutide (TRULICITY) 1.5 TS/1.7BL SOPN Inject 1.5 mg into the skin once a week. 2 mL 5  ? guaiFENesin-codeine 100-10 MG/5ML syrup Take 5 mLs by mouth 3 (three) times daily as needed for cough. 120 mL 0  ? hydrochlorothiazide (HYDRODIURIL) 50 MG tablet Take 0.5 tablets (25 mg total) by mouth 2 (two) times daily. 30 tablet 3  ? HYDROcodone-acetaminophen (NORCO/VICODIN) 5-325 MG tablet Take 1 tablet by mouth every 6 (six) hours as needed for severe pain. 8 tablet 0  ? insulin NPH Human (NOVOLIN N) 100 UNIT/ML injection Inject 0.3 mLs (30 Units total) into the skin 2 (two) times daily before a meal. 20 mL 0  ? insulin regular (NOVOLIN R RELION) 100 units/mL injection Inject 0.1-0.2 mLs (10-20 Units total) into the skin 3 (three) times daily before meals. 20 mL 0  ? Lancets (ACCU-CHEK MULTICLIX)  lancets Use as instructed to test once daily DX E11.65 100 each 4  ? metoprolol tartrate (LOPRESSOR) 50 MG tablet Take 0.5 tablets (25 mg total) by mouth 2 (two) times daily. 30 tablet 3  ? ondansetron (ZO

## 2021-07-11 ENCOUNTER — Other Ambulatory Visit: Payer: Self-pay | Admitting: Family Medicine

## 2021-08-08 ENCOUNTER — Ambulatory Visit: Payer: PPO | Admitting: Internal Medicine

## 2021-08-08 ENCOUNTER — Encounter: Payer: Self-pay | Admitting: Internal Medicine

## 2021-08-08 VITALS — BP 138/84 | HR 90 | Ht 64.25 in | Wt 188.0 lb

## 2021-08-08 DIAGNOSIS — E1165 Type 2 diabetes mellitus with hyperglycemia: Secondary | ICD-10-CM | POA: Diagnosis not present

## 2021-08-08 DIAGNOSIS — M609 Myositis, unspecified: Secondary | ICD-10-CM

## 2021-08-08 DIAGNOSIS — E785 Hyperlipidemia, unspecified: Secondary | ICD-10-CM

## 2021-08-08 DIAGNOSIS — Z794 Long term (current) use of insulin: Secondary | ICD-10-CM

## 2021-08-08 MED ORDER — ACCU-CHEK MULTICLIX LANCETS MISC: 4 refills | Status: DC

## 2021-08-08 MED ORDER — ACCU-CHEK SMARTVIEW VI STRP: ORAL_STRIP | 4 refills | Status: DC

## 2021-08-08 NOTE — Progress Notes (Addendum)
Patient ID: [name], female   DOB: 08/06/1950, 71 y.o.   MRN: [id number]   HPI: [name] is a 71 y.o.-year-old female, returning for f/u for DM2, dx in 2006-2007, insulin-dependent since 2017, uncontrolled, without long term complications.  She saw Dr. Buddy Duty in the past.  Last visit with me 6 months ago. Started to go to [facility]. She switched to [facility] in 02/2021.  Interim history: She continues to have joint pains.  She also has cervical spine steroid injections with high blood sugars frequently.  The injections are helping, though. No increased urination, blurry vision, nausea, chest pain.  She continues to have abdominal pain, though, thought to be related to her previous diverticulitis episodes.  This did not feel better when she was off Ozempic.  She was in the ED with colitis 03/17/2021.  She sees GI.  She was positive for C. Difficile in 04/2021.  This was treated. She tells me she ran out of test trips approximately 3 weeks ago as these were not covered by her insurance.  Unfortunately, she cannot use a regular glucometer due to the fact that she may faint when she sees the needle. She refuses an HbA1c checked today as she feels that this is very high, similar to before. She had to switch from Ozempic to Trulicity since last visit.  Unfortunately, she cannot afford Trulicity, either.  Reviewed HbA1c levels: Lab Results  Component Value Date   HGBA1C 10.4 (A) 01/01/2021   HGBA1C 10.4 (A) 09/19/2020   HGBA1C 10.0 (A) 05/01/2020  12/09/2016: HbA1c 10.7% 07/10/2016: HbA1c 9.6%  Pt was on a regimen of: - NPH 25 units at bedtime - Actos 15 mg in a.m.  She is now on: - Ozempic 0.25 >> 0.5 mg weekly - started 85/2778 >> Trulicity 1.5 mg weekly >> used it for 2 mo - no change in sugars >> now off 2/2 price  Insulin Before breakfast Before lunch Before dinner  Regular 25-30 10-12 (not taking it at work - 3x a week) 25-30  NPH 30  30   She has tried: Metformin >> stomach  cramps, diarrhea Invokana >> dry mouth, rash on face Basaglar >> back muscle spasms, congestion She could not afford Trulicity. We stopped Actos 10/2017 >> Leg swelling improved.  Pt has not been checking blood sugars in the last approximately 1 month, previously: - am: 105-262, 375 (forgot insulin), 450 (steroids) >> 70-315, 96-256 >> 97-285, 345 - 2h after b'fast: 140-205, 244  >> 190 >> 184, 244 >> n/c >> 326 >> n/c - before lunch: 69-210 >> n/c >> 107 >> 71, 165 >> 77 >> 69,  - 2h after lunch: n/c  - snack - before dinner: 113, 180-317, 346 >> 197-313 >> n/c >> 186-312 >> 108-462 - 2h after dinner:  n/c >> 248 >> n/c >> 209 >> n/c >> 175 >> n/c - bedtime:  95, 127-145 >> n/c >> 253 >> n/c >> 238 >> n/c - nighttime: n/c >> 66 >> n/c >> 60 at night >> 83  >> 404 Lowest sugar was  60 (night, light dinner) 71 (running errands) >> 61 >> 69, 88; it is unclear at which level she has hypoglycemia awareness Highest sugar was 538, 580 (steroid) >> 450 >>  527 (steroids) >> 509, 300s.  Glucometer: Accu-Chek  Pt's meals are: - Breakfast: oatmeal, Kashi cereal, English muffin, whole wheat bread - Lunch: sandwich, salad - Dinner: (late, 8 pm): veggies, meat or pasta or eggs +  toast (largest) - Snacks: none + Coke 0, tea + Splenda  -No CKD last BUN/creatinine:  Lab Results  Component Value Date   BUN 12 04/02/2021   BUN 20 03/17/2021   CREATININE 0.85 04/02/2021   CREATININE 0.98 03/17/2021  07/10/2016: ACR 5.5  -+ HL last set of lipids: 06/01/2019: 273/165/52/151 01/15/2019:206/221/50/118 01/06/2018: 204/115/53/129 12/10/2016: 226/113/57/147. No results found for: "CHOL", "HDL", "LDLCALC", "LDLDIRECT", "TRIG", "CHOLHDL"  She is not on a statin.  Lovastatin, rosuvastatin, and atorvastatin and other 3 statins caused muscle aches.   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 [name] - Dr. Jerline Pain.  -No numbness and tingling in her feet She is on Neurontin  100 mg 3 times a day but for trigeminal neuralgia.  On ASA 81.  Unclear FH of DM -patient is adopted.  She also has a history of HTN, fibromyalgia, vitamin D deficiency, PCOS, fatty liver, kidney stones, trigeminal neuralgia, migraines.  ROS: + See HPI  I reviewed pt's medications, allergies, PMH, social hx, family hx, and changes were documented in the history of present illness. Otherwise, unchanged from my initial visit note.  Past Medical History:  Diagnosis Date   Arthritis    C. difficile colitis    Diabetes mellitus    Diverticulitis    Fibromyalgia    Hypercholesteremia    Hypertension    IBS (irritable bowel syndrome)    Migraines    Nasal fracture    Stein-Leventhal ovaries    Past Surgical History:  Procedure Laterality Date   CHOLECYSTECTOMY  1998   KNEE ARTHROSCOPY Right 2017   KNEE ARTHROSCOPY Left    LEFT HEART CATHETERIZATION WITH CORONARY ANGIOGRAM N/A 12/19/2011   Procedure: LEFT HEART CATHETERIZATION WITH CORONARY ANGIOGRAM;  Surgeon: Candee Furbish, MD;  Location: [facility] CATH LAB;  Service: Cardiovascular;  Laterality: N/A;   ROTATOR CUFF REPAIR Right    TONSILLECTOMY  1959   Social History   Socioeconomic History   Marital status: Single    Spouse name: Not on file   Number of children: 0   Years of education: Some college   Highest education level: Not on file  Occupational History   Occupation: accounting    Comment: [employer]  Tobacco Use   Smoking status: Never   Smokeless tobacco: Never  Vaping Use   Vaping Use: Never used  Substance and Sexual Activity   Alcohol use: Yes    Comment: rare-once a year at a celebration   Drug use: No   Sexual activity: Not on file  Other Topics Concern   Not on file  Social History Narrative   Lives alone w/ her 3 cats   Right-handed   Caffeine: 2 cups coffee/tea per day      Adoptive Dad was 1/2 bro to pt birth mother   Social Determinants of Radio broadcast assistant Strain: Not on  file  Food Insecurity: Not on file  Transportation Needs: Not on file  Physical Activity: Not on file  Stress: Not on file  Social Connections: Not on file  Intimate Partner Violence: Not on file   Current Outpatient Medications on File Prior to Visit  Medication Sig Dispense Refill   ACCU-CHEK SMARTVIEW test strip USE AS INSTRUCTED TO TEST TWICE DAILY 100 each 4   acetaminophen (TYLENOL) 650 MG CR tablet 2 tablets as needed     acetaminophen-codeine (TYLENOL #3) 300-30 MG per tablet Take 1 tablet by mouth 2 (two) times daily as needed for  severe pain.      cholecalciferol (VITAMIN D) 1000 UNITS tablet Take 1,000 Units by mouth every evening.      Dulaglutide (TRULICITY) 1.5 XI/3.3AS SOPN Inject 1.5 mg into the skin once a week. 2 mL 5   guaiFENesin-codeine 100-10 MG/5ML syrup Take 5 mLs by mouth 3 (three) times daily as needed for cough. 120 mL 0   hydrochlorothiazide (HYDRODIURIL) 50 MG tablet Take 1/2 (one-half) tablet by mouth twice daily 90 tablet 0   HYDROcodone-acetaminophen (NORCO/VICODIN) 5-325 MG tablet Take 1 tablet by mouth every 6 (six) hours as needed for severe pain. 8 tablet 0   insulin NPH Human (NOVOLIN N) 100 UNIT/ML injection Inject 0.3 mLs (30 Units total) into the skin 2 (two) times daily before a meal. 20 mL 0   insulin regular (NOVOLIN R RELION) 100 units/mL injection Inject 0.1-0.2 mLs (10-20 Units total) into the skin 3 (three) times daily before meals. 20 mL 0   Lancets (ACCU-CHEK MULTICLIX) lancets Use as instructed to test once daily DX E11.65 100 each 4   metoprolol tartrate (LOPRESSOR) 50 MG tablet Take 0.5 tablets (25 mg total) by mouth 2 (two) times daily. 30 tablet 3   ondansetron (ZOFRAN-ODT) 4 MG disintegrating tablet Take 1 tablet (4 mg total) by mouth every 8 (eight) hours as needed for nausea or vomiting. 10 tablet 0   PRESCRIPTION MEDICATION Patient receives a Steroid injection in the neck once a year with Dr. Mina [facility] at [facility] pt is unable to  recall the exact name and i have been unable to verify name from the Dr's office. Patient also states " it shoots up my blood sugars really high, but i still get it because i can't take anything else for my neck pain"     tiZANidine (ZANAFLEX) 4 MG tablet 1 tablet as needed     [DISCONTINUED] gabapentin (NEURONTIN) 100 MG capsule Take 1 capsule (100 mg total) by mouth 3 (three) times daily. (Patient not taking: No sig reported) 90 capsule 12   Current Facility-Administered Medications on File Prior to Visit  Medication Dose Route Frequency Provider Last Rate Last Admin   methylPREDNISolone sodium succinate (SOLU-MEDROL) 130 mg in sodium chloride 0.9 % 50 mL IVPB  130 mg Intravenous Once Jerline Pain, MD       Allergies  Allergen Reactions   Ceftriaxone Hives, Itching and Other (See Comments)   Dicyclomine Hives    Hives, itching, swelling, vomiting, felt like something was sitting on chest   Erythromycin Anaphylaxis and Other (See Comments)   Iodinated Contrast Media Hives, Other (See Comments) and Shortness Of Breath   Amoxicillin-Pot Clavulanate Nausea And Vomiting    Nausea, vomiting, headache, dizziness. Hives on 08/2019 Other reaction(s): hives, Other Nausea, vomiting, headache, dizziness   pt states she can take other Penicillins just not augmentin Nausea, vomiting, headache, dizziness. Hives on 08/2019    Canagliflozin Rash and Other (See Comments)    Dehydration Dehydration Dehydration    Statins Other (See Comments)    Severe muscle pain/weakness Muscle pain/ weekness    Sulfa Antibiotics Other (See Comments)    blackout PER PT SHE BLACKS OUT    Cipro [Ciprofloxacin Hcl] Other (See Comments)   Corticosteroids Other (See Comments)   Insulin Glargine Other (See Comments)    Severe ankle swelling   Metformin And Related Diarrhea and Nausea And Vomiting    Violently ill for 2 yrs   Metformin Hcl Other (See Comments)   Prednisone Other (See Comments)  Abnormal blood  sugars.Elyse Hsu [Shellfish Allergy] Diarrhea and Nausea And Vomiting    Severe GI upset   Tramadol Hcl Other (See Comments)   Nsaids Other (See Comments)    Stomach problems Stomach problems    Tramadol Rash   Family History  Adopted: Yes  Problem Relation Age of Onset   Failure to thrive Mother    Heart failure Father    Neuropathy Neg Hx    PE: BP 138/84 (BP Location: Left Arm, Patient Position: Sitting, Cuff Size: Normal)   Pulse 90   Ht 5' 4.25" (1.632 m)   Wt 188 lb (85.3 kg)   SpO2 99%   BMI 32.02 kg/m    Wt Readings from Last 3 Encounters:  03/17/21 188 lb (85.3 kg)  03/06/21 189 lb (85.7 kg)  02/20/21 190 lb (86.2 kg)   Constitutional: overweight, in NAD Eyes: PERRLA, EOMI, no exophthalmos ENT: moist mucous membranes, no thyromegaly, no cervical lymphadenopathy Cardiovascular: RRR, No MRG. + R>L periankle swelling Respiratory: CTA B Musculoskeletal: no deformities, strength intact in all 4 Skin: moist, warm, no rashes Neurological: no tremor with outstretched hands, DTR normal in all 4  ASSESSMENT: 1. DM2, insulin-dependent, uncontrolled, without long-term complications, but with hyperglycemia  + hypertensive retinopathy  No known family history of medullary thyroid cancer (she is adopted).  No personal history of pancreatitis.  2. HL  3.  Statin induced myositis  PLAN:  1. Patient with longstanding, uncontrolled, type 2 diabetes, on basal/bolus insulin regimen with NPH and regular insulin due to price.  Also, she could not tolerate Basaglar in the past.  A GLP-1 receptor agonist was not covered for her in the past.  However, afterwards, she was able to start Ozempic, but since last visit, she had to switch to Trulicity due to insurance coverage.  At last visit I did advise her to increase Ozempic from 0.25 mg to 0.5 mg weekly.  She is currently on 1.5 mg Trulicity weekly.  At last visit she reported abdominal discomfort and cramps but she felt that  these were related to her previous diverticulitis episode, and not to the GLP-1 receptor agonist.  She also has constipation for which she takes fiber supplements and Dulcolax. -At last visit, sugars are fluctuating, mostly in the 200s in the morning with occasional high blood sugars in the 300s and she was forgetting to take insulin the night before and also 400 after steroid injections.  HbA1c was very high, at 10.4%.  We discussed about trying to change her erratic schedule and not to forget insulin doses.  She was also mostly checking blood sugars in the morning and not later in the day and we discussed about the importance of checking sugars in the second half of the day. -At today's visit, she is not checking sugars at all after she ran out of test strips.  She is using the Multiclix lancets which were recently not covered by her insurance.  I sent a prescription again to her pharmacy mentioning that she needs to have this due to the fact that she may faint if she actually sees the needle going in.  If this is not approved, we will need to submit a PA. -Based on previous blood sugars, sugars remain high and very variable.  She occasionally has a lower blood sugar: 69, 75, 88 midday.  Therefore, I did not suggest an increase in NPH in the morning but we did discuss about doing so when she has a  steroid injection.  Since morning sugars are still high, I did advise her to increase her NPH at night.  It is difficult to make changes in her regular insulin dose as she is not checking blood sugars consistently beyond the a.m. levels.  She does mention that she is still missing her insulin before lunch as she does not know when she has a break to eat her lunch.  She does have the insulin with her.  She is going to try to take this more consistently. -She is walking midday at work and I advised her to try to walk after lunch but stay well-hydrated, especially in this heat. - I suggested to:  Patient Instructions   Please  change: Insulin Before breakfast Before lunch Before dinner  Regular 25-30 10-12 25-30  NPH 30 (40-50 units after a steroid inj)  30 >> 40  Please inject the insulin 30 min before meals.  Please return in 3 months with your sugar log.   - we did not check the HbA1c >> refused  - advised to check sugars at different times of the day - 3x a day, rotating check times - advised for yearly eye exams >> she is UTD - will check a foot exam at next visit if not checked by PCP later this month - return to clinic in 3 months   2. HL - Reviewed latest lipid panel from 05/2019:233/165/52/151: LDL much higher than target, triglycerides slightly high - she is not able to use a statin due to previous intolerance to 6 of them, reportedly - she is due for another lipid panel -has appointment coming up with PCP  3. Myositis 2/2 statins -She was not able to tolerate 6 statins due to significant muscle aches.  Philemon Kingdom, MD PhD [facility]

## 2021-08-08 NOTE — Patient Instructions (Addendum)
Please  change: Insulin Before breakfast Before lunch Before dinner  Regular 25-30 10-12 25-30  NPH 30 (40-50 units after a steroid inj)  30 >> 40  Please inject the insulin 30 min before meals.  Please return in 3 months with your sugar log.

## 2021-08-20 ENCOUNTER — Ambulatory Visit (INDEPENDENT_AMBULATORY_CARE_PROVIDER_SITE_OTHER): Payer: PPO | Admitting: Family Medicine

## 2021-08-20 ENCOUNTER — Encounter: Payer: Self-pay | Admitting: Family Medicine

## 2021-08-20 VITALS — BP 130/76 | HR 82 | Temp 97.3°F | Ht 65.0 in

## 2021-08-20 DIAGNOSIS — M5412 Radiculopathy, cervical region: Secondary | ICD-10-CM | POA: Diagnosis not present

## 2021-08-20 DIAGNOSIS — I10 Essential (primary) hypertension: Secondary | ICD-10-CM

## 2021-08-20 MED ORDER — METOPROLOL TARTRATE 50 MG PO TABS: 25.0000 mg | ORAL_TABLET | Freq: Two times a day (BID) | ORAL | 11 refills | Status: DC

## 2021-08-20 MED ORDER — ACETAMINOPHEN-CODEINE 300-30 MG PO TABS: 1.0000 | ORAL_TABLET | Freq: Three times a day (TID) | ORAL | 0 refills | Status: DC | PRN

## 2021-08-20 NOTE — Patient Instructions (Signed)
It was very nice to see you today!  Keep working on diet/exercise   PLEASE NOTE:  If you had any lab tests please let us know if you have not heard back within a few days. You may see your results on MyChart before we have a chance to review them but we will give you a call once they are reviewed by us. If we ordered any referrals today, please let us know if you have not heard from their office within the next week.   Please try these tips to maintain a healthy lifestyle:  Eat most of your calories during the day when you are active. Eliminate processed foods including packaged sweets (pies, cakes, cookies), reduce intake of potatoes, white bread, white pasta, and white rice. Look for whole grain options, oat flour or almond flour.  Each meal should contain half fruits/vegetables, one quarter protein, and one quarter carbs (no bigger than a computer mouse).  Cut down on sweet beverages. This includes juice, soda, and sweet tea. Also watch fruit intake, though this is a healthier sweet option, it still contains natural sugar! Limit to 3 servings daily.  Drink at least 1 glass of water with each meal and aim for at least 8 glasses per day  Exercise at least 150 minutes every week.   

## 2021-08-20 NOTE — Progress Notes (Signed)
Subjective:     Patient ID: [name], female    DOB: 02-09-1950, 71 y.o.   MRN: [id number]  Chief Complaint  Patient presents with   Follow-up    6 month follow-up on HTN Not fasting     HPI  HTN-Pr is on metoprolol '25mg'$  bid and hctz '25mg'$  bid .  Bp's running       .  No ha/dizziness/cp/palp/cough/sob.  Chronic edema-R from tendons.better at HS.   GI-colitis-resolved.  No f/u needed per pt.  Working on diet. Myalgias/spine pain-T3  got 60 tabs Aug last yr. Vertebrae in neck "collapsing" and muscles spasming.  Gets injections at times.  Tizanidine about once/wk and 1/2 tab.  Migraine-takes hydro occ.  Still has a lot from 60 tabs May 2022.   No SI  Health Maintenance Due  Topic Date Due   FOOT EXAM  Never done   MAMMOGRAM  Never done   DEXA SCAN  Never done   OPHTHALMOLOGY EXAM  04/28/2021    Past Medical History:  Diagnosis Date   Arthritis    C. difficile colitis    Diabetes mellitus    Diverticulitis    Fibromyalgia    Hypercholesteremia    Hypertension    IBS (irritable bowel syndrome)    Migraines    Nasal fracture    Stein-Leventhal ovaries     Past Surgical History:  Procedure Laterality Date   CHOLECYSTECTOMY  1998   KNEE ARTHROSCOPY Right 2017   KNEE ARTHROSCOPY Left    LEFT HEART CATHETERIZATION WITH CORONARY ANGIOGRAM N/A 12/19/2011   Procedure: LEFT HEART CATHETERIZATION WITH CORONARY ANGIOGRAM;  Surgeon: Candee Furbish, MD;  Location: [facility] CATH LAB;  Service: Cardiovascular;  Laterality: N/A;   ROTATOR CUFF REPAIR Right    TONSILLECTOMY  1959    Outpatient Medications Prior to Visit  Medication Sig Dispense Refill   ACCU-CHEK SMARTVIEW test strip Use 2x a day 200 each 4   acetaminophen (TYLENOL) 650 MG CR tablet 2 tablets as needed     cholecalciferol (VITAMIN D) 1000 UNITS tablet Take 1,000 Units by mouth every evening.      hydrochlorothiazide (HYDRODIURIL) 50 MG tablet Take 1/2 (one-half) tablet by mouth twice daily 90 tablet 0    HYDROcodone-acetaminophen (NORCO/VICODIN) 5-325 MG tablet Take 1 tablet by mouth every 6 (six) hours as needed for severe pain. 8 tablet 0   insulin NPH Human (NOVOLIN N) 100 UNIT/ML injection Inject 0.3 mLs (30 Units total) into the skin 2 (two) times daily before a meal. 20 mL 0   insulin regular (NOVOLIN R RELION) 100 units/mL injection Inject 0.1-0.2 mLs (10-20 Units total) into the skin 3 (three) times daily before meals. 20 mL 0   Lancets (ACCU-CHEK MULTICLIX) lancets Use as instructed to test 2x daily DX E11.65 200 each 4   potassium chloride (KLOR-CON) 8 MEQ tablet Take by mouth daily.     PRESCRIPTION MEDICATION Patient receives a Steroid injection in the neck once a year with Dr. Mina Marble at [facility] pt is unable to recall the exact name and i have been unable to verify name from the Dr's office. Patient also states " it shoots up my blood sugars really high, but i still get it because i can't take anything else for my neck pain"     tiZANidine (ZANAFLEX) 4 MG tablet 1 tablet as needed     acetaminophen-codeine (TYLENOL #3) 300-30 MG per tablet Take 1 tablet by mouth 2 (two) times daily as  needed for severe pain.      metoprolol tartrate (LOPRESSOR) 50 MG tablet Take 0.5 tablets (25 mg total) by mouth 2 (two) times daily. 30 tablet 3   guaiFENesin-codeine 100-10 MG/5ML syrup Take 5 mLs by mouth 3 (three) times daily as needed for cough. 120 mL 0   ondansetron (ZOFRAN-ODT) 4 MG disintegrating tablet Take 1 tablet (4 mg total) by mouth every 8 (eight) hours as needed for nausea or vomiting. 10 tablet 0   methylPREDNISolone sodium succinate (SOLU-MEDROL) 130 mg in sodium chloride 0.9 % 50 mL IVPB      No facility-administered medications prior to visit.    Allergies  Allergen Reactions   Ceftriaxone Hives, Itching and Other (See Comments)   Dicyclomine Hives    Hives, itching, swelling, vomiting, felt like something was sitting on chest   Erythromycin Anaphylaxis and Other (See  Comments)   Iodinated Contrast Media Hives, Other (See Comments) and Shortness Of Breath   Amoxicillin-Pot Clavulanate Nausea And Vomiting    Nausea, vomiting, headache, dizziness. Hives on 08/2019 Other reaction(s): hives, Other Nausea, vomiting, headache, dizziness   pt states she can take other Penicillins just not augmentin Nausea, vomiting, headache, dizziness. Hives on 08/2019    Canagliflozin Rash and Other (See Comments)    Dehydration Dehydration Dehydration    Statins Other (See Comments)    Severe muscle pain/weakness Muscle pain/ weekness    Sulfa Antibiotics Other (See Comments)    blackout PER PT SHE BLACKS OUT    Cipro [Ciprofloxacin Hcl] Other (See Comments)   Corticosteroids Other (See Comments)   Insulin Glargine Other (See Comments)    Severe ankle swelling   Metformin And Related Diarrhea and Nausea And Vomiting    Violently ill for 2 yrs   Metformin Hcl Other (See Comments)   Scallops [Shellfish Allergy] Diarrhea and Nausea And Vomiting    Severe GI upset   Tramadol Hcl Other (See Comments)   Nsaids Other (See Comments)    Stomach problems Stomach problems    Tramadol Rash   ROS neg/noncontributory except as noted HPI/below      Objective:     BP 130/76   Pulse 82   Temp (!) 97.3 F (36.3 C) (Temporal)   Ht '5\' 5"'$  (1.651 m)   SpO2 97%   BMI 31.28 kg/m  Wt Readings from Last 3 Encounters:  08/08/21 188 lb (85.3 kg)  03/17/21 188 lb (85.3 kg)  03/06/21 189 lb (85.7 kg)    Physical Exam   Gen: WDWN NAD HEENT: NCAT, conjunctiva not injected, sclera nonicteric NECK:  supple, no thyromegaly, no nodes, no carotid bruits CARDIAC: RRR, S1S2+, no murmur. DP 2+B LUNGS: CTAB. No wheezes ABDOMEN:  BS+, soft, NTND, No HSM, no masses EXT:  mild R>L edema ankles MSK: . Kasandra Knudsen.  Tender all over NEURO: A&O x3.  CN II-XII intact.  PSYCH: normal mood. Good eye contact  PDMP checked Pt declined labs     Assessment & Plan:   Problem List Items  Addressed This Visit       Cardiovascular and Mediastinum   Essential hypertension - Primary   Relevant Medications   metoprolol tartrate (LOPRESSOR) 50 MG tablet     Nervous and Auditory   Cervical radiculopathy   Relevant Medications   acetaminophen-codeine (TYLENOL #3) 300-30 MG tablet   HTN-chronic.  Well controlled.  Cont metoprolol '25mg'$  bid and hctz '25mg'$  bid-renewed  pt declines labs.  Discussed risks.  F/u 6 mo Cervical radiculopathy-getting injections.  Takes tizanidine once/wk on average.  Takes T3 1-2x/mo-refilled #60/0.  PDMP checked.    Meds ordered this encounter  Medications   metoprolol tartrate (LOPRESSOR) 50 MG tablet    Sig: Take 0.5 tablets (25 mg total) by mouth 2 (two) times daily.    Dispense:  30 tablet    Refill:  11   acetaminophen-codeine (TYLENOL #3) 300-30 MG tablet    Sig: Take 1 tablet by mouth every 8 (eight) hours as needed for moderate pain.    Dispense:  60 tablet    Refill:  0    Wellington Hampshire, MD

## 2021-08-21 DIAGNOSIS — M5412 Radiculopathy, cervical region: Secondary | ICD-10-CM | POA: Diagnosis not present

## 2021-08-23 DIAGNOSIS — H1045 Other chronic allergic conjunctivitis: Secondary | ICD-10-CM | POA: Diagnosis not present

## 2021-09-19 DIAGNOSIS — Z789 Other specified health status: Secondary | ICD-10-CM

## 2021-09-19 NOTE — Progress Notes (Signed)
[facility])                                            [facility] Team                                        Statin Quality Measure Assessment    09/19/2021  [name] [facility] 10-08-1950 [id number]  Per review of chart and payor information, this patient has been flagged for non-adherence to the following CMS Quality Measure:   [x]  Statin Use in Persons with Diabetes  []  Statin Use in Persons with Cardiovascular Disease  This patient is failing SUPD CMS measure. Per prior documentation, h/o statin intolerance that caused "severe muscle pain". No antihyperlipidemic on file and lipid panel is not available. If deemed clinically appropriate, please consider assessing for statin re-challenge and associating exclusion code (see options below) at the next office visit on 09/21/21.   Next appointment with PCP:  09/21/2021  Please consider ONE of the following recommendations:   Initiate high intensity statin Atorvastatin 65m once daily, #90, 3 refills   Rosuvastatin 240monce daily, #90, 3 refills    Initiate moderate intensity          statin with reduced frequency if prior          statin intolerance 1x weekly, #13, 3 refills   2x weekly, #26, 3 refills   3x weekly, #39, 3 refills   Code for past statin intolerance or other exclusions (required annually)  Drug Induced Myopathy G72.0   Myositis, unspecified M60.9   Rhabdomyolysis M62.82   Cirrhosis of liver K74.69   Biliary cirrhosis, unspecified K74.5   Abnormal blood glucose - for SUPD ONLY R73.09   Prediabetes - for SUPD ONLY  R73.03   Thank you for your time,  [name]: [phone]

## 2021-09-21 ENCOUNTER — Encounter: Payer: Self-pay | Admitting: Family Medicine

## 2021-09-21 ENCOUNTER — Ambulatory Visit (INDEPENDENT_AMBULATORY_CARE_PROVIDER_SITE_OTHER): Payer: PPO | Admitting: Family Medicine

## 2021-09-21 VITALS — BP 138/76 | HR 87 | Temp 97.6°F | Ht 65.0 in

## 2021-09-21 DIAGNOSIS — M5412 Radiculopathy, cervical region: Secondary | ICD-10-CM

## 2021-09-21 DIAGNOSIS — R519 Headache, unspecified: Secondary | ICD-10-CM | POA: Diagnosis not present

## 2021-09-21 NOTE — Patient Instructions (Signed)
Compression stockings.

## 2021-09-21 NOTE — Progress Notes (Signed)
Subjective:     Patient ID: [name], female    DOB: January 17, 1951, 71 y.o.   MRN: [id number]  Chief Complaint  Patient presents with   Cyst    HPI 7/20-neck injection-helped. 2 days later was driving and pounding back of head and felt like "fluid" was shooting up back of head like "a shepherd's hook".  Made it home and ice pack and went to home.   Dad had similar and pt concerned so called ortho and told not from injection.  Concerned injection didn't go where was supposed.  Concerned about getting lumbar injection. Had some HA today as muscles aching.  No more pounding of "fluid" feeling.  Back of head and frontal.  Lortab helped some. Never filled gabapentin as drowsy on tizanidine so concerned will be bad.   Neck pain back now. Afraid to do lower back injection now as fearful   H/o migraine..   Ankles swelling more-getting worse-no sob/cp.  Health Maintenance Due  Topic Date Due   FOOT EXAM  Never done   URINE MICROALBUMIN  Never done   MAMMOGRAM  Never done   DEXA SCAN  Never done    Past Medical History:  Diagnosis Date   Arthritis    C. difficile colitis    Diabetes mellitus    Diverticulitis    Fibromyalgia    Hypercholesteremia    Hypertension    IBS (irritable bowel syndrome)    Migraines    Nasal fracture    Stein-Leventhal ovaries     Past Surgical History:  Procedure Laterality Date   CHOLECYSTECTOMY  1998   KNEE ARTHROSCOPY Right 2017   KNEE ARTHROSCOPY Left    LEFT HEART CATHETERIZATION WITH CORONARY ANGIOGRAM N/A 12/19/2011   Procedure: LEFT HEART CATHETERIZATION WITH CORONARY ANGIOGRAM;  Surgeon: Candee Furbish, MD;  Location: [facility] CATH LAB;  Service: Cardiovascular;  Laterality: N/A;   ROTATOR CUFF REPAIR Right    TONSILLECTOMY  1959    Outpatient Medications Prior to Visit  Medication Sig Dispense Refill   ACCU-CHEK SMARTVIEW test strip Use 2x a day 200 each 4   acetaminophen (TYLENOL) 650 MG CR tablet 2 tablets as needed      acetaminophen-codeine (TYLENOL #3) 300-30 MG tablet Take 1 tablet by mouth every 8 (eight) hours as needed for moderate pain. 60 tablet 0   cholecalciferol (VITAMIN D) 1000 UNITS tablet Take 1,000 Units by mouth every evening.      hydrochlorothiazide (HYDRODIURIL) 50 MG tablet Take 1/2 (one-half) tablet by mouth twice daily 90 tablet 0   HYDROcodone-acetaminophen (NORCO/VICODIN) 5-325 MG tablet Take 1 tablet by mouth every 6 (six) hours as needed for severe pain. 8 tablet 0   insulin NPH Human (NOVOLIN N) 100 UNIT/ML injection Inject 0.3 mLs (30 Units total) into the skin 2 (two) times daily before a meal. 20 mL 0   insulin regular (NOVOLIN R RELION) 100 units/mL injection Inject 0.1-0.2 mLs (10-20 Units total) into the skin 3 (three) times daily before meals. 20 mL 0   Lancets (ACCU-CHEK MULTICLIX) lancets Use as instructed to test 2x daily DX E11.65 200 each 4   metoprolol tartrate (LOPRESSOR) 50 MG tablet Take 0.5 tablets (25 mg total) by mouth 2 (two) times daily. 30 tablet 11   potassium chloride (KLOR-CON) 8 MEQ tablet Take by mouth daily.     PRESCRIPTION MEDICATION Patient receives a Steroid injection in the neck once a year with Dr. Mina Marble at [facility] pt is unable to recall  the exact name and i have been unable to verify name from the Dr's office. Patient also states " it shoots up my blood sugars really high, but i still get it because i can't take anything else for my neck pain"     tiZANidine (ZANAFLEX) 4 MG tablet 1 tablet as needed     VALIUM 10 MG tablet Take by mouth.     No facility-administered medications prior to visit.    Allergies  Allergen Reactions   Ceftriaxone Hives, Itching and Other (See Comments)   Dicyclomine Hives    Hives, itching, swelling, vomiting, felt like something was sitting on chest   Erythromycin Anaphylaxis and Other (See Comments)   Iodinated Contrast Media Hives, Other (See Comments) and Shortness Of Breath   Amoxicillin-Pot Clavulanate  Nausea And Vomiting    Nausea, vomiting, headache, dizziness. Hives on 08/2019 Other reaction(s): hives, Other Nausea, vomiting, headache, dizziness   pt states she can take other Penicillins just not augmentin Nausea, vomiting, headache, dizziness. Hives on 08/2019    Canagliflozin Rash and Other (See Comments)    Dehydration Dehydration Dehydration    Statins Other (See Comments)    Severe muscle pain/weakness Muscle pain/ weekness    Sulfa Antibiotics Other (See Comments)    blackout PER PT SHE BLACKS OUT    Cipro [Ciprofloxacin Hcl] Other (See Comments)   Corticosteroids Other (See Comments)   Insulin Glargine Other (See Comments)    Severe ankle swelling   Metformin And Related Diarrhea and Nausea And Vomiting    Violently ill for 2 yrs   Metformin Hcl Other (See Comments)   Scallops [Shellfish Allergy] Diarrhea and Nausea And Vomiting    Severe GI upset   Tramadol Hcl Other (See Comments)   Nsaids Other (See Comments)    Stomach problems Stomach problems    Tramadol Rash   ROS neg/noncontributory except as noted HPI/below      Objective:     BP 138/76   Pulse 87   Temp 97.6 F (36.4 C)   Ht 5' 5"  (1.651 m)   SpO2 97%   BMI 31.28 kg/m  Wt Readings from Last 3 Encounters:  08/08/21 188 lb (85.3 kg)  03/17/21 188 lb (85.3 kg)  03/06/21 189 lb (85.7 kg)    Physical Exam   Gen: WDWN NAD HEENT: NCAT, conjunctiva not injected, sclera nonicteric EOMI TM WNL B, OP moist, no exudates  NECK:  supple, no thyromegaly, no nodes, no carotid bruits CARDIAC: RRR, S1S2+, no murmur. DP 2+B LUNGS: CTAB. No wheezes ABDOMEN:  BS+, soft, NTND, No HSM, no masses EXT: Bipedal edema, spider veins. MSK: cane, slow gait.   Neck-no TTP.  No TTP head.  NEURO: A&O x3.  CN II-XII intact. eomi PSYCH: normal mood. Good eye contact     Assessment & Plan:   Problem List Items Addressed This Visit       Nervous and Auditory   Cervical radiculopathy   Relevant  Medications   VALIUM 10 MG tablet   Other Visit Diagnoses     Acute nonintractable headache, unspecified headache type    -  Primary      Acute HA after injection of neck-not sure if from that or set off other event.  Resolved now. Monitor   advised to f/u ortho before sch another injection Cervical radiculopathy-getting injections-had above reacton.  F/u ortho.   No orders of the defined types were placed in this encounter.   Wellington Hampshire, MD

## 2021-10-02 DIAGNOSIS — M47816 Spondylosis without myelopathy or radiculopathy, lumbar region: Secondary | ICD-10-CM | POA: Diagnosis not present

## 2021-10-16 ENCOUNTER — Ambulatory Visit (INDEPENDENT_AMBULATORY_CARE_PROVIDER_SITE_OTHER): Payer: PPO | Admitting: Family Medicine

## 2021-10-16 ENCOUNTER — Encounter: Payer: Self-pay | Admitting: Family Medicine

## 2021-10-16 VITALS — BP 130/76 | HR 63 | Temp 98.3°F | Ht 65.0 in

## 2021-10-16 DIAGNOSIS — K5792 Diverticulitis of intestine, part unspecified, without perforation or abscess without bleeding: Secondary | ICD-10-CM | POA: Diagnosis not present

## 2021-10-16 MED ORDER — METRONIDAZOLE 500 MG PO TABS: 500.0000 mg | ORAL_TABLET | Freq: Three times a day (TID) | ORAL | 0 refills | Status: DC

## 2021-10-16 NOTE — Progress Notes (Signed)
Subjective:     Patient ID: [name], female    DOB: 03-03-1950, 71 y.o.   MRN: [id number]  Chief Complaint  Patient presents with   Diverticulitis    Having a flare up    HPI Flare of diverticulitis-since 9/8-abc cramps bad and nausea.  No vomiting.  No diarrhea. No f/c.  Tired.  Not feel good. Same symptoms as in past.    Health Maintenance Due  Topic Date Due   FOOT EXAM  Never done   URINE MICROALBUMIN  Never done   MAMMOGRAM  Never done   DEXA SCAN  Never done    Past Medical History:  Diagnosis Date   Arthritis    C. difficile colitis    Diabetes mellitus    Diverticulitis    Fibromyalgia    Hypercholesteremia    Hypertension    IBS (irritable bowel syndrome)    Migraines    Nasal fracture    Stein-Leventhal ovaries     Past Surgical History:  Procedure Laterality Date   CHOLECYSTECTOMY  1998   KNEE ARTHROSCOPY Right 2017   KNEE ARTHROSCOPY Left    LEFT HEART CATHETERIZATION WITH CORONARY ANGIOGRAM N/A 12/19/2011   Procedure: LEFT HEART CATHETERIZATION WITH CORONARY ANGIOGRAM;  Surgeon: Candee Furbish, MD;  Location: [facility] CATH LAB;  Service: Cardiovascular;  Laterality: N/A;   ROTATOR CUFF REPAIR Right    TONSILLECTOMY  1959    Outpatient Medications Prior to Visit  Medication Sig Dispense Refill   ACCU-CHEK SMARTVIEW test strip Use 2x a day 200 each 4   acetaminophen (TYLENOL) 650 MG CR tablet 2 tablets as needed     acetaminophen-codeine (TYLENOL #3) 300-30 MG tablet Take 1 tablet by mouth every 8 (eight) hours as needed for moderate pain. 60 tablet 0   cholecalciferol (VITAMIN D) 1000 UNITS tablet Take 1,000 Units by mouth every evening.      hydrochlorothiazide (HYDRODIURIL) 50 MG tablet Take 1/2 (one-half) tablet by mouth twice daily 90 tablet 0   HYDROcodone-acetaminophen (NORCO/VICODIN) 5-325 MG tablet Take 1 tablet by mouth every 6 (six) hours as needed for severe pain. 8 tablet 0   insulin NPH Human (NOVOLIN N) 100 UNIT/ML injection Inject  0.3 mLs (30 Units total) into the skin 2 (two) times daily before a meal. 20 mL 0   Lancets (ACCU-CHEK MULTICLIX) lancets Use as instructed to test 2x daily DX E11.65 200 each 4   metoprolol tartrate (LOPRESSOR) 50 MG tablet Take 0.5 tablets (25 mg total) by mouth 2 (two) times daily. 30 tablet 11   potassium chloride (KLOR-CON) 8 MEQ tablet Take by mouth daily.     PRESCRIPTION MEDICATION Patient receives a Steroid injection in the neck once a year with Dr. Mina Marble at [facility] pt is unable to recall the exact name and i have been unable to verify name from the Dr's office. Patient also states " it shoots up my blood sugars really high, but i still get it because i can't take anything else for my neck pain"     tiZANidine (ZANAFLEX) 4 MG tablet 1 tablet as needed     VALIUM 10 MG tablet Take by mouth.     insulin regular (NOVOLIN R RELION) 100 units/mL injection Inject 0.1-0.2 mLs (10-20 Units total) into the skin 3 (three) times daily before meals. 20 mL 0   No facility-administered medications prior to visit.    Allergies  Allergen Reactions   Ceftriaxone Hives, Itching and Other (See Comments)  Dicyclomine Hives    Hives, itching, swelling, vomiting, felt like something was sitting on chest   Erythromycin Anaphylaxis and Other (See Comments)   Iodinated Contrast Media Hives, Other (See Comments) and Shortness Of Breath   Amoxicillin-Pot Clavulanate Nausea And Vomiting    Nausea, vomiting, headache, dizziness. Hives on 08/2019 Other reaction(s): hives, Other Nausea, vomiting, headache, dizziness   pt states she can take other Penicillins just not augmentin Nausea, vomiting, headache, dizziness. Hives on 08/2019    Canagliflozin Rash and Other (See Comments)    Dehydration Dehydration Dehydration    Statins Other (See Comments)    Severe muscle pain/weakness Muscle pain/ weekness    Sulfa Antibiotics Other (See Comments)    blackout PER PT SHE BLACKS OUT    Cipro  [Ciprofloxacin Hcl] Other (See Comments)   Corticosteroids Other (See Comments)   Insulin Glargine Other (See Comments)    Severe ankle swelling   Metformin And Related Diarrhea and Nausea And Vomiting    Violently ill for 2 yrs   Metformin Hcl Other (See Comments)   Scallops [Shellfish Allergy] Diarrhea and Nausea And Vomiting    Severe GI upset   Tramadol Hcl Other (See Comments)   Nsaids Other (See Comments)    Stomach problems Stomach problems    Tramadol Rash   ROS neg/noncontributory except as noted HPI/below Neck injections-ortho can't/won't do any more injections.  Lumbar sch 10/3      Objective:     BP 130/76   Pulse 63   Temp 98.3 F (36.8 C) (Temporal)   Ht 5' 5"  (1.651 m)   SpO2 98%   BMI 31.28 kg/m  Wt Readings from Last 3 Encounters:  08/08/21 188 lb (85.3 kg)  03/17/21 188 lb (85.3 kg)  03/06/21 189 lb (85.7 kg)    Physical Exam   Gen: WDWN NAD HEENT: NCAT, conjunctiva not injected, sclera nonicteric NECK:  supple, no thyromegaly, no nodes, no carotid bruits CARDIAC: RRR, S1S2+, no murmur.  ABDOMEN:  BS+, soft, tender diffusely esp L side.  No HSM, no masses EXT:  no edema MSK: cane  NEURO: A&O x3.  CN II-XII intact.  PSYCH: normal mood. Good eye contact     Assessment & Plan:   Problem List Items Addressed This Visit       Other   Diverticulitis - Primary   Diverticulitis-presumed. As allergic augmentin and some SE to cipro, will do Metronidazole 512m 3x/day for 10 days(also rec by GI).  If not improving in 48 hrs, will add moxifloxin.   If worse, sicker, to ER.   No orders of the defined types were placed in this encounter.   AWellington Hampshire MD

## 2021-10-16 NOTE — Patient Instructions (Addendum)
Metronidazole 546m 3x/day for 10 days  If not improving in 48 hrs, will add moxifloxin.   If worse, sicker, to ER.

## 2021-11-04 ENCOUNTER — Other Ambulatory Visit: Payer: Self-pay | Admitting: Family Medicine

## 2021-11-06 DIAGNOSIS — M47816 Spondylosis without myelopathy or radiculopathy, lumbar region: Secondary | ICD-10-CM | POA: Diagnosis not present

## 2021-12-05 ENCOUNTER — Encounter: Payer: Self-pay | Admitting: Family Medicine

## 2021-12-05 ENCOUNTER — Ambulatory Visit (INDEPENDENT_AMBULATORY_CARE_PROVIDER_SITE_OTHER): Payer: PPO | Admitting: Family Medicine

## 2021-12-05 VITALS — BP 150/70 | HR 70 | Temp 97.8°F | Ht 65.0 in

## 2021-12-05 DIAGNOSIS — J069 Acute upper respiratory infection, unspecified: Secondary | ICD-10-CM | POA: Diagnosis not present

## 2021-12-05 DIAGNOSIS — N760 Acute vaginitis: Secondary | ICD-10-CM | POA: Diagnosis not present

## 2021-12-05 DIAGNOSIS — R300 Dysuria: Secondary | ICD-10-CM

## 2021-12-05 LAB — POCT URINALYSIS DIPSTICK
Bilirubin, UA: NEGATIVE
Blood, UA: NEGATIVE
Glucose, UA: POSITIVE — AB
Ketones, UA: NEGATIVE
Leukocytes, UA: NEGATIVE
Nitrite, UA: NEGATIVE
Protein, UA: NEGATIVE
Spec Grav, UA: 1.02 (ref 1.010–1.025)
Urobilinogen, UA: 0.2 E.U./dL
pH, UA: 5.5 (ref 5.0–8.0)

## 2021-12-05 MED ORDER — FLUCONAZOLE 150 MG PO TABS: 150.0000 mg | ORAL_TABLET | ORAL | 0 refills | Status: DC | PRN

## 2021-12-05 MED ORDER — AMOXICILLIN 500 MG PO CAPS: 500.0000 mg | ORAL_CAPSULE | Freq: Three times a day (TID) | ORAL | 0 refills | Status: AC

## 2021-12-05 MED ORDER — HYDROCODONE-ACETAMINOPHEN 5-325 MG PO TABS: 1.0000 | ORAL_TABLET | Freq: Four times a day (QID) | ORAL | 0 refills | Status: DC | PRN

## 2021-12-05 NOTE — Progress Notes (Signed)
Subjective:     Patient ID: [name], female    DOB: 03/15/1950, 71 y.o.   MRN: [id number]  Chief Complaint  Patient presents with   Ongoing Cramps    Having ongoing abdominal cramping     HPI  1 wk, drainage, congestion, and some cough.  No f/c.  Getting worse.   2.  If eats wrong, abd cramping.  Now, more uncomfortable.  Vagina not feel right, inc urinary freq/urgency.  No dysuria.  If cramping, rad to back.  No f/c.   Some vag d/c-cottage cheese-monostat helped some.    Some occ urine gushing.   Health Maintenance Due  Topic Date Due   Medicare Annual Wellness (AWV)  Never done   FOOT EXAM  Never done   MAMMOGRAM  Never done   DEXA SCAN  Never done   Diabetic kidney evaluation - Urine ACR  07/09/2017   HEMOGLOBIN A1C  07/01/2021    Past Medical History:  Diagnosis Date   Arthritis    C. difficile colitis    Diabetes mellitus    Diverticulitis    Fibromyalgia    Hypercholesteremia    Hypertension    IBS (irritable bowel syndrome)    Migraines    Nasal fracture    Stein-Leventhal ovaries     Past Surgical History:  Procedure Laterality Date   CHOLECYSTECTOMY  1998   KNEE ARTHROSCOPY Right 2017   KNEE ARTHROSCOPY Left    LEFT HEART CATHETERIZATION WITH CORONARY ANGIOGRAM N/A 12/19/2011   Procedure: LEFT HEART CATHETERIZATION WITH CORONARY ANGIOGRAM;  Surgeon: Candee Furbish, MD;  Location: [facility] CATH LAB;  Service: Cardiovascular;  Laterality: N/A;   ROTATOR CUFF REPAIR Right    TONSILLECTOMY  1959    Outpatient Medications Prior to Visit  Medication Sig Dispense Refill   ACCU-CHEK SMARTVIEW test strip Use 2x a day 200 each 4   acetaminophen (TYLENOL) 650 MG CR tablet 2 tablets as needed     acetaminophen-codeine (TYLENOL #3) 300-30 MG tablet Take 1 tablet by mouth every 8 (eight) hours as needed for moderate pain. 60 tablet 0   cholecalciferol (VITAMIN D) 1000 UNITS tablet Take 1,000 Units by mouth every evening.      hydrochlorothiazide (HYDRODIURIL) 50  MG tablet Take 1/2 (one-half) tablet by mouth twice daily 90 tablet 0   insulin NPH Human (NOVOLIN N) 100 UNIT/ML injection Inject 0.3 mLs (30 Units total) into the skin 2 (two) times daily before a meal. 20 mL 0   Lancets (ACCU-CHEK MULTICLIX) lancets Use as instructed to test 2x daily DX E11.65 200 each 4   metoprolol tartrate (LOPRESSOR) 50 MG tablet Take 0.5 tablets (25 mg total) by mouth 2 (two) times daily. 30 tablet 11   metroNIDAZOLE (FLAGYL) 500 MG tablet Take 1 tablet (500 mg total) by mouth 3 (three) times daily. 30 tablet 0   potassium chloride (KLOR-CON) 8 MEQ tablet Take by mouth daily.     PRESCRIPTION MEDICATION Patient receives a Steroid injection in the neck once a year with Dr. Mina Marble at [facility] pt is unable to recall the exact name and i have been unable to verify name from the Dr's office. Patient also states " it shoots up my blood sugars really high, but i still get it because i can't take anything else for my neck pain"     tiZANidine (ZANAFLEX) 4 MG tablet 1 tablet as needed     HYDROcodone-acetaminophen (NORCO/VICODIN) 5-325 MG tablet Take 1 tablet by mouth  every 6 (six) hours as needed for severe pain. 8 tablet 0   VALIUM 10 MG tablet Take by mouth.     No facility-administered medications prior to visit.    Allergies  Allergen Reactions   Ceftriaxone Hives, Itching and Other (See Comments)   Dicyclomine Hives    Hives, itching, swelling, vomiting, felt like something was sitting on chest   Erythromycin Anaphylaxis and Other (See Comments)   Iodinated Contrast Media Hives, Other (See Comments) and Shortness Of Breath   Amoxicillin-Pot Clavulanate Nausea And Vomiting    Nausea, vomiting, headache, dizziness. Hives on 08/2019 Other reaction(s): hives, Other Nausea, vomiting, headache, dizziness   pt states she can take other Penicillins just not augmentin Nausea, vomiting, headache, dizziness. Hives on 08/2019    Canagliflozin Rash and Other (See  Comments)    Dehydration Dehydration Dehydration    Statins Other (See Comments)    Severe muscle pain/weakness Muscle pain/ weekness    Sulfa Antibiotics Other (See Comments)    blackout PER PT SHE BLACKS OUT    Cipro [Ciprofloxacin Hcl] Other (See Comments)   Corticosteroids Other (See Comments)   Insulin Glargine Other (See Comments)    Severe ankle swelling   Metformin And Related Diarrhea and Nausea And Vomiting    Violently ill for 2 yrs   Metformin Hcl Other (See Comments)   Scallops [Shellfish Allergy] Diarrhea and Nausea And Vomiting    Severe GI upset   Tramadol Hcl Other (See Comments)   Nsaids Other (See Comments)    Stomach problems Stomach problems    Tramadol Rash   ROS neg/noncontributory except as noted HPI/below      Objective:     BP (!) 150/70   Pulse 70   Temp 97.8 F (36.6 C) (Temporal)   Ht 5' 5"  (1.651 m)   SpO2 98%   BMI 31.28 kg/m  Wt Readings from Last 3 Encounters:  08/08/21 188 lb (85.3 kg)  03/17/21 188 lb (85.3 kg)  03/06/21 189 lb (85.7 kg)    Physical Exam   Gen: WDWN NAD HEENT: NCAT, conjunctiva not injected, sclera nonicteric TM WNL B, OP moist, no exudates  NECK:  supple, no thyromegaly, no nodes, no carotid bruits CARDIAC: RRR, S1S2+, no murmur. LUNGS: CTAB. No wheezes ABDOMEN:  BS+, soft, mild tender LUQ, suprapubic, No HSM, no masses. No CVAT EXT:  no edema MSK: no gross abnormalities cane  NEURO: A&O x3.  CN II-XII intact.  PSYCH: normal mood. Good eye contact  Results for orders placed or performed in visit on 12/05/21  POCT urinalysis dipstick  Result Value Ref Range   Color, UA YELLOW    Clarity, UA CLEAR    Glucose, UA Positive (A) Negative   Bilirubin, UA NEG    Ketones, UA NEG    Spec Grav, UA 1.020 1.010 - 1.025   Blood, UA NEG    pH, UA 5.5 5.0 - 8.0   Protein, UA Negative Negative   Urobilinogen, UA 0.2 0.2 or 1.0 E.U./dL   Nitrite, UA NEG    Leukocytes, UA Negative Negative   Appearance      Odor          Assessment & Plan:   Problem List Items Addressed This Visit   None Visit Diagnoses     Dysuria    -  Primary   Relevant Orders   POCT urinalysis dipstick (Completed)   Urine Culture   Upper respiratory tract infection, unspecified type  Relevant Medications   fluconazole (DIFLUCAN) 150 MG tablet   Acute vaginitis          Poss UTI, could be diverticulitis but doubt as urinary freq, etc.  Will tx w/amox 500tid(can tolerate-just not augmentin).  Chck cx URI-getting worse-amox should help cover as well Vaginitis-some better w/monostat-will do diflucan 141m.  Worse, no change, needs exam or swab.    Meds ordered this encounter  Medications   HYDROcodone-acetaminophen (NORCO/VICODIN) 5-325 MG tablet    Sig: Take 1 tablet by mouth every 6 (six) hours as needed for severe pain.    Dispense:  15 tablet    Refill:  0   amoxicillin (AMOXIL) 500 MG capsule    Sig: Take 1 capsule (500 mg total) by mouth 3 (three) times daily for 10 days.    Dispense:  30 capsule    Refill:  0   fluconazole (DIFLUCAN) 150 MG tablet    Sig: Take 1 tablet (150 mg total) by mouth every three (3) days as needed.    Dispense:  2 tablet    Refill:  0    AWellington Hampshire MD

## 2021-12-05 NOTE — Patient Instructions (Signed)
Difulcan now for yeast.  Repeat after 3 days(or more) if needed

## 2021-12-06 LAB — URINE CULTURE
MICRO NUMBER:: 14130352
SPECIMEN QUALITY:: ADEQUATE

## 2021-12-07 ENCOUNTER — Ambulatory Visit: Payer: PPO | Admitting: Internal Medicine

## 2022-01-02 ENCOUNTER — Encounter (HOSPITAL_BASED_OUTPATIENT_CLINIC_OR_DEPARTMENT_OTHER): Payer: Self-pay

## 2022-01-02 ENCOUNTER — Other Ambulatory Visit: Payer: Self-pay | Admitting: Family Medicine

## 2022-01-02 ENCOUNTER — Ambulatory Visit (INDEPENDENT_AMBULATORY_CARE_PROVIDER_SITE_OTHER): Payer: PPO | Admitting: Family Medicine

## 2022-01-02 ENCOUNTER — Encounter: Payer: Self-pay | Admitting: Family Medicine

## 2022-01-02 ENCOUNTER — Ambulatory Visit (HOSPITAL_BASED_OUTPATIENT_CLINIC_OR_DEPARTMENT_OTHER)
Admission: RE | Admit: 2022-01-02 | Discharge: 2022-01-02 | Disposition: A | Discharge status: Home / Self Care | Payer: PPO | Source: Ambulatory Visit | Attending: Family Medicine | Admitting: Family Medicine

## 2022-01-02 VITALS — BP 130/70 | HR 73 | Temp 97.8°F | Ht 65.0 in

## 2022-01-02 DIAGNOSIS — G720 Drug-induced myopathy: Secondary | ICD-10-CM

## 2022-01-02 DIAGNOSIS — R1030 Lower abdominal pain, unspecified: Secondary | ICD-10-CM | POA: Diagnosis not present

## 2022-01-02 DIAGNOSIS — Z789 Other specified health status: Secondary | ICD-10-CM

## 2022-01-02 DIAGNOSIS — R109 Unspecified abdominal pain: Secondary | ICD-10-CM | POA: Diagnosis not present

## 2022-01-02 LAB — POCT URINALYSIS DIPSTICK
Bilirubin, UA: NEGATIVE
Blood, UA: NEGATIVE
Glucose, UA: POSITIVE — AB
Ketones, UA: NEGATIVE
Leukocytes, UA: NEGATIVE
Nitrite, UA: NEGATIVE
Protein, UA: NEGATIVE
Spec Grav, UA: 1.025 (ref 1.010–1.025)
Urobilinogen, UA: 0.2 E.U./dL
pH, UA: 5.5 (ref 5.0–8.0)

## 2022-01-02 MED ORDER — METRONIDAZOLE 500 MG PO TABS: 500.0000 mg | ORAL_TABLET | Freq: Three times a day (TID) | ORAL | 0 refills | Status: DC

## 2022-01-02 MED ORDER — TIZANIDINE HCL 4 MG PO TABS: 4.0000 mg | ORAL_TABLET | Freq: Four times a day (QID) | ORAL | 3 refills | Status: DC | PRN

## 2022-01-02 NOTE — Progress Notes (Signed)
Lmam diverticulitis.  Renewed flagyl

## 2022-01-02 NOTE — Patient Instructions (Signed)
Happy Holidays  Follow up w/GI.  Await CT report  If worse, go to ER

## 2022-01-02 NOTE — Progress Notes (Signed)
LMAM 11/29 at 820pm.  Has some diverticulitis-sent flagyl 526m to [facility].  If not improving, will need to add more meds.  Not sure if cramping from this or possibly back so do the tizanidine as discussed.  Needs to f/u GI as getting so often.

## 2022-01-02 NOTE — Progress Notes (Addendum)
Subjective:     Patient ID: [name], female    DOB: 1950/06/27, 71 y.o.   MRN: [id number]  Chief Complaint  Patient presents with   Spasms    Spasms getting worse, diverticulitis, cramps are really bad    HPI Abd pain/cramping-lower abd for months.  Intensity varies. Beef worse so stopped that.  Salad ok and carbs.   Very bad cramps 2 nights ago- Vomited and felt better. Lower abd cramps. Was up sev times per night 2 nights ago-didn't go to ER.   Last cscope 2 yrs ago.  No fever but "chilled" easily. No diarrhea.  Soft stools this week.   Ate 2 cookies at 10am .   Tizanidine helps  Some vag discomfort.  No d/c.   Urine warm coming out  Health Maintenance Due  Topic Date Due   Medicare Annual Wellness (AWV)  Never done   FOOT EXAM  Never done   COLONOSCOPY (Pts 45-32yr Insurance coverage will need to be confirmed)  Never done   MAMMOGRAM  Never done   DEXA SCAN  Never done   Diabetic kidney evaluation - Urine ACR  07/09/2017   DTaP/Tdap/Td (2 - Td or Tdap) 07/27/2019   HEMOGLOBIN A1C  07/01/2021    Past Medical History:  Diagnosis Date   Arthritis    C. difficile colitis    Diabetes mellitus    Diverticulitis    Fibromyalgia    Hypercholesteremia    Hypertension    IBS (irritable bowel syndrome)    Migraines    Nasal fracture    Stein-Leventhal ovaries     Past Surgical History:  Procedure Laterality Date   CHOLECYSTECTOMY  1998   KNEE ARTHROSCOPY Right 2017   KNEE ARTHROSCOPY Left    LEFT HEART CATHETERIZATION WITH CORONARY ANGIOGRAM N/A 12/19/2011   Procedure: LEFT HEART CATHETERIZATION WITH CORONARY ANGIOGRAM;  Surgeon: MCandee Furbish MD;  [facility];  Service: Cardiovascular;  Laterality: N/A;   ROTATOR CUFF REPAIR Right    TONSILLECTOMY  1959    Outpatient Medications Prior to Visit  Medication Sig Dispense Refill   ACCU-CHEK SMARTVIEW test strip Use 2x a day 200 each 4   acetaminophen (TYLENOL) 650 MG CR tablet 2 tablets as needed      acetaminophen-codeine (TYLENOL #3) 300-30 MG tablet Take 1 tablet by mouth every 8 (eight) hours as needed for moderate pain. 60 tablet 0   cholecalciferol (VITAMIN D) 1000 UNITS tablet Take 1,000 Units by mouth every evening.      fluconazole (DIFLUCAN) 150 MG tablet Take 1 tablet (150 mg total) by mouth every three (3) days as needed. 2 tablet 0   hydrochlorothiazide (HYDRODIURIL) 50 MG tablet Take 1/2 (one-half) tablet by mouth twice daily 90 tablet 0   HYDROcodone-acetaminophen (NORCO/VICODIN) 5-325 MG tablet Take 1 tablet by mouth every 6 (six) hours as needed for severe pain. 15 tablet 0   insulin NPH Human (NOVOLIN N) 100 UNIT/ML injection Inject 0.3 mLs (30 Units total) into the skin 2 (two) times daily before a meal. 20 mL 0   Lancets (ACCU-CHEK MULTICLIX) lancets Use as instructed to test 2x daily DX E11.65 200 each 4   metoprolol tartrate (LOPRESSOR) 50 MG tablet Take 0.5 tablets (25 mg total) by mouth 2 (two) times daily. 30 tablet 11   potassium chloride (KLOR-CON) 8 MEQ tablet Take by mouth daily.     PRESCRIPTION MEDICATION Patient receives a Steroid injection in the neck once a year with  Dr. Mina Marble at [facility] pt is unable to recall the exact name and i have been unable to verify name from the Dr's office. Patient also states " it shoots up my blood sugars really high, but i still get it because i can't take anything else for my neck pain"     tiZANidine (ZANAFLEX) 4 MG tablet 1 tablet as needed     metroNIDAZOLE (FLAGYL) 500 MG tablet Take 1 tablet (500 mg total) by mouth 3 (three) times daily. (Patient not taking: Reported on 01/02/2022) 30 tablet 0   No facility-administered medications prior to visit.    Allergies  Allergen Reactions   Ceftriaxone Hives, Itching and Other (See Comments)   Dicyclomine Hives    Hives, itching, swelling, vomiting, felt like something was sitting on chest   Erythromycin Anaphylaxis and Other (See Comments)   Iodinated Contrast Media  Hives, Other (See Comments) and Shortness Of Breath   Amoxicillin-Pot Clavulanate Nausea And Vomiting    Nausea, vomiting, headache, dizziness. Hives on 08/2019 Other reaction(s): hives, Other Nausea, vomiting, headache, dizziness   pt states she can take other Penicillins just not augmentin Nausea, vomiting, headache, dizziness. Hives on 08/2019    Canagliflozin Rash and Other (See Comments)    Dehydration Dehydration Dehydration    Statins Other (See Comments)    Severe muscle pain/weakness Muscle pain/ weekness    Sulfa Antibiotics Other (See Comments)    blackout PER PT SHE BLACKS OUT    Cipro [Ciprofloxacin Hcl] Other (See Comments)   Corticosteroids Other (See Comments)   Insulin Glargine Other (See Comments)    Severe ankle swelling   Metformin And Related Diarrhea and Nausea And Vomiting    Violently ill for 2 yrs   Metformin Hcl Other (See Comments)   Scallops [Shellfish Allergy] Diarrhea and Nausea And Vomiting    Severe GI upset   Tramadol Hcl Other (See Comments)   Nsaids Other (See Comments)    Stomach problems Stomach problems    Tramadol Rash   ROS neg/noncontributory except as noted HPI/below      Objective:     BP 130/70   Pulse 73   Temp 97.8 F (36.6 C) (Temporal)   Ht 5' 5"  (1.651 m)   SpO2 99%   BMI 31.28 kg/m  Wt Readings from Last 3 Encounters:  08/08/21 188 lb (85.3 kg)  03/17/21 188 lb (85.3 kg)  03/06/21 189 lb (85.7 kg)    Physical Exam   Gen: WDWN NAD HEENT: NCAT, conjunctiva not injected, sclera nonicteric NECK:  supple, no thyromegaly, no nodes, no carotid bruits ABDOMEN:  BS+, soft, mod diffusely tender esp lower abd, No HSM, no masses EXT:  no edema MSK: no gross abnormalities.  NEURO: A&O x3.  CN II-XII intact.  PSYCH: normal mood. Good eye contact  Results for orders placed or performed in visit on 01/02/22  POCT urinalysis dipstick  Result Value Ref Range   Color, UA YELLOW    Clarity, UA CLEAR    Glucose, UA  Positive (A) Negative   Bilirubin, UA NEG    Ketones, UA NEG    Spec Grav, UA 1.025 1.010 - 1.025   Blood, UA NEG    pH, UA 5.5 5.0 - 8.0   Protein, UA Negative Negative   Urobilinogen, UA 0.2 0.2 or 1.0 E.U./dL   Nitrite, UA NEG    Leukocytes, UA Negative Negative   Appearance     Odor  Assessment & Plan:   Problem List Items Addressed This Visit       Musculoskeletal and Integument   Drug-induced myopathy   Other Visit Diagnoses     Lower abdominal pain    -  Primary   Relevant Orders   CT Abdomen Pelvis Wo Contrast (Completed)   POCT urinalysis dipstick (Completed)   Statin intolerance          Abd pain-acute on chronic.  H/o freq diverticulitis.  Need to check CT to determine need for abx, etc.  Allergic to IV dye so w/o.  Check ua.  Tizanidine 64m for spasms-works for her and allergic to bentyl.  Advised to f/u GI.  Declines labs and our lab closed today.   Statin intolerant d/t drug induced myopathy-mult-documented for quality measures  Meds ordered this encounter  Medications   tiZANidine (ZANAFLEX) 4 MG tablet    Sig: Take 1 tablet (4 mg total) by mouth every 6 (six) hours as needed for muscle spasms.    Dispense:  30 tablet    Refill:  3    AWellington Hampshire MD

## 2022-01-22 ENCOUNTER — Encounter: Payer: Self-pay | Admitting: Family Medicine

## 2022-01-22 ENCOUNTER — Ambulatory Visit (INDEPENDENT_AMBULATORY_CARE_PROVIDER_SITE_OTHER): Payer: PPO | Admitting: Family Medicine

## 2022-01-22 VITALS — BP 170/90 | HR 97 | Temp 98.2°F | Ht 65.0 in

## 2022-01-22 DIAGNOSIS — J40 Bronchitis, not specified as acute or chronic: Secondary | ICD-10-CM | POA: Diagnosis not present

## 2022-01-22 MED ORDER — PREDNISONE 20 MG PO TABS: 40.0000 mg | ORAL_TABLET | Freq: Every day | ORAL | 0 refills | Status: AC

## 2022-01-22 MED ORDER — ALBUTEROL SULFATE HFA 108 (90 BASE) MCG/ACT IN AERS: 2.0000 | INHALATION_SPRAY | Freq: Four times a day (QID) | RESPIRATORY_TRACT | 0 refills | Status: DC | PRN

## 2022-01-22 MED ORDER — DOXYCYCLINE HYCLATE 100 MG PO TABS: 100.0000 mg | ORAL_TABLET | Freq: Two times a day (BID) | ORAL | 0 refills | Status: DC

## 2022-01-22 NOTE — Progress Notes (Signed)
Subjective:     Patient ID: [name], female    DOB: 17-Nov-1950, 71 y.o.   MRN: [id number]  Chief Complaint  Patient presents with   Cough    Dry cough causing vomiting when coughing deep and a headache   Nasal Congestion    Started last Thursday Took OTC cough medicine, Dayquil and Nyquil    HPI Dry cough-so much causes vomiting.  Some ha w/it as well.  Started 01/17/22.  No f/c.  A lot of congestion. No sob.  Couldn't sleep d/t cough. 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   Health Maintenance Due  Topic Date Due   Medicare Annual Wellness (AWV)  Never done   FOOT EXAM  Never done   Diabetic kidney evaluation - Urine ACR  Never done   COLONOSCOPY (Pts 45-49yr Insurance coverage will need to be confirmed)  Never done   MAMMOGRAM  Never done   DEXA SCAN  Never done   DTaP/Tdap/Td (2 - Td or Tdap) 07/27/2019   HEMOGLOBIN A1C  07/01/2021    Past Medical History:  Diagnosis Date   Arthritis    C. difficile colitis    Diabetes mellitus    Diverticulitis    Fibromyalgia    Hypercholesteremia    Hypertension    IBS (irritable bowel syndrome)    Migraines    Nasal fracture    Stein-Leventhal ovaries     Past Surgical History:  Procedure Laterality Date   CHOLECYSTECTOMY  1998   KNEE ARTHROSCOPY Right 2017   KNEE ARTHROSCOPY Left    LEFT HEART CATHETERIZATION WITH CORONARY ANGIOGRAM N/A 12/19/2011   Procedure: LEFT HEART CATHETERIZATION WITH CORONARY ANGIOGRAM;  Surgeon: MCandee Furbish MD;  [facility];  Service: Cardiovascular;  Laterality: N/A;   ROTATOR CUFF REPAIR Right    TONSILLECTOMY  1959    Outpatient Medications Prior to Visit  Medication Sig Dispense Refill   ACCU-CHEK SMARTVIEW test strip Use 2x a day 200 each 4   acetaminophen (TYLENOL) 650 MG CR tablet 2 tablets as needed     acetaminophen-codeine (TYLENOL #3) 300-30 MG tablet Take 1 tablet by mouth every 8 (eight) hours as needed for moderate pain. 60 tablet 0    cholecalciferol (VITAMIN D) 1000 UNITS tablet Take 1,000 Units by mouth every evening.      hydrochlorothiazide (HYDRODIURIL) 50 MG tablet Take 1/2 (one-half) tablet by mouth twice daily 90 tablet 0   HYDROcodone-acetaminophen (NORCO/VICODIN) 5-325 MG tablet Take 1 tablet by mouth every 6 (six) hours as needed for severe pain. 15 tablet 0   insulin NPH Human (NOVOLIN N) 100 UNIT/ML injection Inject 0.3 mLs (30 Units total) into the skin 2 (two) times daily before a meal. 20 mL 0   Lancets (ACCU-CHEK MULTICLIX) lancets Use as instructed to test 2x daily DX E11.65 200 each 4   metoprolol tartrate (LOPRESSOR) 50 MG tablet Take 0.5 tablets (25 mg total) by mouth 2 (two) times daily. 30 tablet 11   metroNIDAZOLE (FLAGYL) 500 MG tablet Take 1 tablet (500 mg total) by mouth 3 (three) times daily. 30 tablet 0   potassium chloride (KLOR-CON) 8 MEQ tablet Take by mouth daily.     PRESCRIPTION MEDICATION Patient receives a Steroid [facility] in the neck once a year [facility] to [facility] the exact name and i have been unable to verify name from the Dr's office. Patient also states " it shoots up my  blood sugars really high, but i still get it because i can't take anything else for my neck pain"     tiZANidine (ZANAFLEX) 4 MG tablet Take 1 tablet (4 mg total) by mouth every 6 (six) hours as needed for muscle spasms. 30 tablet 3   fluconazole (DIFLUCAN) 150 MG tablet Take 1 tablet (150 mg total) by mouth every three (3) days as needed. (Patient not taking: Reported on 01/22/2022) 2 tablet 0   No facility-administered medications prior to visit.    Allergies  Allergen Reactions   Ceftriaxone Hives, Itching and Other (See Comments)   Dicyclomine Hives    Hives, itching, swelling, vomiting, felt like something was sitting on chest   Erythromycin Anaphylaxis and Other (See Comments)   Iodinated Contrast Media Hives, Other (See Comments) and Shortness Of Breath    Amoxicillin-Pot Clavulanate Nausea And Vomiting    Nausea, vomiting, headache, dizziness. Hives on 08/2019 Other reaction(s): hives, Other Nausea, vomiting, headache, dizziness   pt states she can take other Penicillins just not augmentin Nausea, vomiting, headache, dizziness. Hives on 08/2019    Canagliflozin Rash and Other (See Comments)    Dehydration Dehydration Dehydration    Statins Other (See Comments)    Severe muscle pain/weakness Muscle pain/ weekness    Sulfa Antibiotics Other (See Comments)    blackout PER PT SHE BLACKS OUT    Cipro [Ciprofloxacin Hcl] Other (See Comments)   Corticosteroids Other (See Comments)   Insulin Glargine Other (See Comments)    Severe ankle swelling   Metformin And Related Diarrhea and Nausea And Vomiting    Violently ill for 2 yrs   Metformin Hcl Other (See Comments)   Scallops [Shellfish Allergy] Diarrhea and Nausea And Vomiting    Severe GI upset   Tramadol Hcl Other (See Comments)   Nsaids Other (See Comments)    Stomach problems Stomach problems    Tramadol Rash   ROS neg/noncontributory except as noted HPI/below      Objective:     BP (!) 170/90   Pulse 97   Temp 98.2 F (36.8 C) (Temporal)   Ht 5' 5"  (1.651 m)   SpO2 97%   BMI 31.28 kg/m  Wt Readings from Last 3 Encounters:  08/08/21 188 lb (85.3 kg)  03/17/21 188 lb (85.3 kg)  03/06/21 189 lb (85.7 kg)    Physical Exam   Gen: WDWN NAD HEENT: NCAT, conjunctiva not injected, sclera nonicteric TM WNL B, OP moist, no exudates .  congested NECK:  supple, no thyromegaly, no nodes, no carotid bruits CARDIAC: RRR, S1S2+, no murmur.  LUNGS: CTAB. No wheezes MSK: no gross abnormalities.  NEURO: A&O x3.  CN II-XII intact.  PSYCH: normal mood. Good eye contact     Assessment & Plan:   Problem List Items Addressed This Visit   None Visit Diagnoses     Bronchitis    -  Primary      Bronchitis-doxy 181m bid, pred 457mdaily, albuterol2 puffs q 6hr prn.    Work 4h/day till 1/1(pt "can't" take time off so just 4 hr limits).  Meds ordered this encounter  Medications   doxycycline (VIBRA-TABS) 100 MG tablet    Sig: Take 1 tablet (100 mg total) by mouth 2 (two) times daily.    Dispense:  20 tablet    Refill:  0   predniSONE (DELTASONE) 20 MG tablet    Sig: Take 2 tablets (40 mg total) by mouth daily with breakfast for 5 days.  Dispense:  10 tablet    Refill:  0   albuterol (VENTOLIN HFA) 108 (90 Base) MCG/ACT inhaler    Sig: Inhale 2 puffs into the lungs every 6 (six) hours as needed for wheezing or shortness of breath.    Dispense:  8 g    Refill:  0    Wellington Hampshire, MD

## 2022-01-22 NOTE — Patient Instructions (Addendum)
It was very nice to see you today!  Happy Holidays!  Try a hydrocodone to see if helps.    PLEASE NOTE:  If you had any lab tests please let us know if you have not heard back within a few days. You may see your results on MyChart before we have a chance to review them but we will give you a call once they are reviewed by [place]. If we ordered any referrals today, please let us know if you have not heard from their office within the next week.   Please try these tips to maintain a healthy lifestyle:  Eat most of your calories during the day when you are active. Eliminate processed foods including packaged sweets (pies, cakes, cookies), reduce intake of potatoes, white bread, white pasta, and white rice. Look for whole grain options, oat flour or almond flour.  Each meal should contain half fruits/vegetables, one quarter protein, and one quarter carbs (no bigger than a computer mouse).  Cut down on sweet beverages. This includes juice, soda, and sweet tea. Also watch fruit intake, though this is a healthier sweet option, it still contains natural sugar! Limit to 3 servings daily.  Drink at least 1 glass of water with each meal and aim for at least 8 glasses per day  Exercise at least 150 minutes every week.

## 2022-02-14 ENCOUNTER — Other Ambulatory Visit: Payer: Self-pay | Admitting: Family Medicine

## 2022-02-15 ENCOUNTER — Encounter: Payer: Self-pay | Admitting: Internal Medicine

## 2022-02-15 ENCOUNTER — Ambulatory Visit: Payer: Medicare HMO | Admitting: Internal Medicine

## 2022-02-15 VITALS — BP 120/76 | HR 83 | Ht 65.0 in

## 2022-02-15 DIAGNOSIS — G720 Drug-induced myopathy: Secondary | ICD-10-CM

## 2022-02-15 DIAGNOSIS — Z794 Long term (current) use of insulin: Secondary | ICD-10-CM

## 2022-02-15 DIAGNOSIS — E785 Hyperlipidemia, unspecified: Secondary | ICD-10-CM | POA: Diagnosis not present

## 2022-02-15 DIAGNOSIS — E1159 Type 2 diabetes mellitus with other circulatory complications: Secondary | ICD-10-CM

## 2022-02-15 LAB — POCT GLYCOSYLATED HEMOGLOBIN (HGB A1C): Hemoglobin A1C: 11.7 % — AB (ref 4.0–5.6)

## 2022-02-15 MED ORDER — ACCU-CHEK SMARTVIEW VI STRP: ORAL_STRIP | 4 refills | Status: DC

## 2022-02-15 MED ORDER — ACCU-CHEK MULTICLIX LANCETS MISC: 4 refills | Status: DC

## 2022-02-15 NOTE — Progress Notes (Signed)
Patient ID: [name], female   DOB: 24-Aug-1950, 72 y.o.   MRN: [id number]   HPI: [name] is a 72 y.o.-year-old female, returning for f/u for DM2, dx in 2006-2007, insulin-dependent since 2017, uncontrolled, with complications (aortic atherosclerosis).  She saw Dr. Buddy Sexton in the past.  Last visit with me 6 months ago. Started to go to [facility] She switched to [facility] in 02/2021.  Interim history: She continues to have joint pains.  She also has cervical spine steroid injections.  The injections are helping, though. No increased urination, blurry vision, nausea.  She continues to have abdominal Sexton, though, thought to be related to her previous diverticulitis episodes.  This did not feel better when she was off Ozempic.   She had bronchitis and PNA - she has residual hoarseness.  Since last visit, she had UTI and diverticulitis. She had a CT scan on 01/02/2022 and this showed aortic atherosclerosis.  Reviewed HbA1c levels: Lab Results  Component Value Date   HGBA1C 10.4 (A) 01/01/2021   HGBA1C 10.4 (A) 09/19/2020   HGBA1C 10.0 (A) 05/01/2020  12/09/2016: HbA1c 10.7% 07/10/2016: HbA1c 9.6%  Pt was on a regimen of: - NPH 25 units at bedtime - Actos 15 mg in a.m.  She is now on: - Ozempic 0.25 >> 0.5 mg weekly - started 56/2130 >> Trulicity 1.5 mg weekly >> used it for 2 mo - no change in sugars >> now off 2/2 price -Since last visit, she inadvertently decreased the dose of regular insulin before breakfast and dinner (in blue): Insulin Before breakfast Before lunch Before dinner  Regular 25-30 >> 15 10-12 >> 12 25-30 >> 15  NPH 30 (40-50 units after a steroid inj) >> 40  30 >> 40   She has tried: Metformin >> stomach cramps, diarrhea Invokana >> dry mouth, rash on face Basaglar >> back muscle spasms, congestion She could not afford Trulicity. We stopped Actos 10/2017 >> Leg swelling improved.  She continues to not check blood sugars as the desired supplies were not  approved by her insurance.  From before: - am: 105-262, 375, 450 (steroids) >> 70-315, 96-256 >> 97-285, 345 - 2h after b'fast:  190 >> 184, 244 >> n/c >> 326 >> n/c - before lunch: 69-210 >> n/c >> 107 >> 71, 165 >> 77 >> 69,  - 2h after lunch: n/c  - snack - before dinner:  197-313 >> n/c >> 186-312 >> 108-462 - 2h after dinner:  248 >> n/c >> 209 >> n/c >> 175 >> n/c - bedtime:  95, 127-145 >> n/c >> 253 >> n/c >> 238 >> n/c - nighttime: n/c >> 66 >> n/c >> 60 at night >> 83  >> 404 Lowest sugar was  60 (night, light dinner) 71 (running errands) >> 61 >> 69, 88; it is unclear at which level she has hypoglycemia awareness Highest sugar was 538, 580 (steroid) >> 450 >>  527 (steroids) >> 509, 300s.  Glucometer: Accu-Chek  Pt's meals are: - Breakfast: oatmeal, Kashi cereal, English muffin, whole wheat bread - Lunch: sandwich, salad - Dinner: (late, 8 pm): veggies, meat or pasta or eggs + toast (largest) - Snacks: none + Coke 0, tea + Splenda  -No CKD last BUN/creatinine:  Lab Results  Component Value Date   BUN 12 04/02/2021   BUN 20 03/17/2021   CREATININE 0.85 04/02/2021   CREATININE 0.98 03/17/2021  07/10/2016: ACR 5.5  -+ HL last set of lipids: 06/01/2019: 273/165/52/151 01/15/2019:206/221/50/118 01/06/2018:  204/115/53/129 12/10/2016: 226/113/57/147. No results found for: "CHOL", "HDL", "LDLCALC", "LDLDIRECT", "TRIG", "CHOLHDL"  She is not on a statin.  Lovastatin, rosuvastatin, and atorvastatin and other 3 statins caused muscle aches.   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 [name] - Dr. Jerline Sexton.  -No numbness and tingling in her feet She is on Neurontin 100 mg 3 times a day but for trigeminal neuralgia.  On ASA 81.  Unclear FH of DM -patient is adopted.  She also has a history of HTN, fibromyalgia, vitamin D deficiency, PCOS, fatty liver, kidney stones, trigeminal neuralgia, migraines. She was in the ED with colitis 03/17/2021.  She  sees GI.  She was positive for C. Difficile in 04/2021.  This was treated.  ROS: + See HPI  I reviewed pt's medications, allergies, PMH, social hx, family hx, and changes were documented in the history of present illness. Otherwise, unchanged from my initial visit note.  Past Medical History:  Diagnosis Date   Arthritis    C. difficile colitis    Diabetes mellitus    Diverticulitis    Fibromyalgia    Hypercholesteremia    Hypertension    IBS (irritable bowel syndrome)    Migraines    Nasal fracture    Stein-Leventhal ovaries    Past Surgical History:  Procedure Laterality Date   CHOLECYSTECTOMY  1998   KNEE ARTHROSCOPY Right 2017   KNEE ARTHROSCOPY Left    LEFT HEART CATHETERIZATION WITH CORONARY ANGIOGRAM N/A 12/19/2011   Procedure: LEFT HEART CATHETERIZATION WITH CORONARY ANGIOGRAM;  Surgeon: Candee Furbish, MD;  Location: [facility] CATH LAB;  Service: Cardiovascular;  Laterality: N/A;   ROTATOR CUFF REPAIR Right    TONSILLECTOMY  1959   Social History   Socioeconomic History   Marital status: Single    Spouse name: Not on file   Number of children: 0   Years of education: Some college   Highest education level: Not on file  Occupational History   Occupation: accounting    Comment: [employer]  Tobacco Use   Smoking status: Never   Smokeless tobacco: Never  Vaping Use   Vaping Use: Never used  Substance and Sexual Activity   Alcohol use: Yes    Comment: rare-once a year at a celebration   Drug use: No   Sexual activity: Not on file  Other Topics Concern   Not on file  Social History Narrative   Lives alone w/ her 3 cats   Right-handed   Caffeine: 2 cups coffee/tea per day      Adoptive Dad was 1/2 bro to pt birth mother   Social Determinants of Radio broadcast assistant Strain: Not on file  Food Insecurity: Not on file  Transportation Needs: Not on file  Physical Activity: Not on file  Stress: Not on file  Social Connections: Not on file  Intimate  Partner Violence: Not on file   Current Outpatient Medications on File Prior to Visit  Medication Sig Dispense Refill   ACCU-CHEK SMARTVIEW test strip Use 2x a day 200 each 4   acetaminophen (TYLENOL) 650 MG CR tablet 2 tablets as needed     acetaminophen-codeine (TYLENOL #3) 300-30 MG tablet Take 1 tablet by mouth every 8 (eight) hours as needed for moderate Sexton. 60 tablet 0   albuterol (VENTOLIN HFA) 108 (90 Base) MCG/ACT inhaler Inhale 2 puffs into the lungs every 6 (six) hours as needed for wheezing or shortness of breath. 8 g 0  cholecalciferol (VITAMIN D) 1000 UNITS tablet Take 1,000 Units by mouth every evening.      doxycycline (VIBRA-TABS) 100 MG tablet Take 1 tablet (100 mg total) by mouth 2 (two) times daily. 20 tablet 0   fluconazole (DIFLUCAN) 150 MG tablet Take 1 tablet (150 mg total) by mouth every three (3) days as needed. (Patient not taking: Reported on 01/22/2022) 2 tablet 0   hydrochlorothiazide (HYDRODIURIL) 50 MG tablet Take 1/2 (one-half) tablet by mouth twice daily 90 tablet 0   HYDROcodone-acetaminophen (NORCO/VICODIN) 5-325 MG tablet Take 1 tablet by mouth every 6 (six) hours as needed for severe Sexton. 15 tablet 0   insulin NPH Human (NOVOLIN N) 100 UNIT/ML injection Inject 0.3 mLs (30 Units total) into the skin 2 (two) times daily before a meal. 20 mL 0   Lancets (ACCU-CHEK MULTICLIX) lancets Use as instructed to test 2x daily DX E11.65 200 each 4   metoprolol tartrate (LOPRESSOR) 50 MG tablet Take 0.5 tablets (25 mg total) by mouth 2 (two) times daily. 30 tablet 11   metroNIDAZOLE (FLAGYL) 500 MG tablet Take 1 tablet (500 mg total) by mouth 3 (three) times daily. 30 tablet 0   potassium chloride (KLOR-CON) 8 MEQ tablet Take by mouth daily.     PRESCRIPTION MEDICATION Patient receives a Steroid injection in the neck once a year with Dr. Mina Marble at [facility] pt is unable to recall the exact name and i have been unable to verify name from the Dr's office. Patient  also states " it shoots up my blood sugars really high, but i still get it because i can't take anything else for my neck Sexton"     tiZANidine (ZANAFLEX) 4 MG tablet Take 1 tablet (4 mg total) by mouth every 6 (six) hours as needed for muscle spasms. 30 tablet 3   [DISCONTINUED] gabapentin (NEURONTIN) 100 MG capsule Take 1 capsule (100 mg total) by mouth 3 (three) times daily. (Patient not taking: Reported on 07/06/2019) 90 capsule 12   No current facility-administered medications on file prior to visit.   Allergies  Allergen Reactions   Ceftriaxone Hives, Itching and Other (See Comments)   Dicyclomine Hives    Hives, itching, swelling, vomiting, felt like something was sitting on chest   Erythromycin Anaphylaxis and Other (See Comments)   Iodinated Contrast Media Hives, Other (See Comments) and Shortness Of Breath   Amoxicillin-Pot Clavulanate Nausea And Vomiting    Nausea, vomiting, headache, dizziness. Hives on 08/2019 Other reaction(s): hives, Other Nausea, vomiting, headache, dizziness   pt states she can take other Penicillins just not augmentin Nausea, vomiting, headache, dizziness. Hives on 08/2019    Canagliflozin Rash and Other (See Comments)    Dehydration Dehydration Dehydration    Statins Other (See Comments)    Severe muscle Sexton/weakness Muscle Sexton/ weekness    Sulfa Antibiotics Other (See Comments)    blackout PER PT SHE BLACKS OUT    Cipro [Ciprofloxacin Hcl] Other (See Comments)   Corticosteroids Other (See Comments)   Insulin Glargine Other (See Comments)    Severe ankle swelling   Metformin And Related Diarrhea and Nausea And Vomiting    Violently ill for 2 yrs   Metformin Hcl Other (See Comments)   Scallops [Shellfish Allergy] Diarrhea and Nausea And Vomiting    Severe GI upset   Tramadol Hcl Other (See Comments)   Nsaids Other (See Comments)    Stomach problems Stomach problems    Tramadol Rash   Family History  Adopted:  Yes  Problem Relation  Age of Onset   Failure to thrive Mother    Heart failure Father    Neuropathy Neg Hx    PE: BP 120/76 (BP Location: Left Arm, Patient Position: Sitting, Cuff Size: Normal)   Pulse 83   Patient refused to be weighed today. Wt Readings from Last 3 Encounters:  08/08/21 188 lb (85.3 kg)  03/17/21 188 lb (85.3 kg)  03/06/21 189 lb (85.7 kg)   Constitutional: overweight, in NAD Eyes:  EOMI, no exophthalmos ENT: no neck masses, no cervical lymphadenopathy Cardiovascular: RRR, No MRG, + R>L periankle swelling, but improved from before Respiratory: CTA B Musculoskeletal: no deformities Skin:no rashes Neurological: no tremor with outstretched hands  ASSESSMENT: 1. DM2, insulin-dependent, uncontrolled, with with complications -Aortic atherosclerosis  + hypertensive retinopathy  No known family history of medullary thyroid cancer (she is adopted).  No personal history of pancreatitis.  2. HL  3.  Statin induced myositis  PLAN:  1. Patient with longstanding, uncontrolled, type 2 diabetes, on basal bolus insulin with NPH and regular insulin due to price.  She could not tolerate Basaglar insulin in the past.  GLP-1 receptor agonist was not covered for her initially, but she was then able to start Ozempic, then switch to Trulicity due to insurance coverage.  However, at last visit she was off due to price.  At last visit, she was not checking sugars at all after she ran out of test strips.  She was using a Multiclix lancets which stopped being covered by her insurance.  I sent the prescription again to the pharmacy mentioning that she needed to have these due to the fact that she may faint if she actually sees the needle going in.  At last visit, she also refused an HbA1c.  At that time, we discussed about increasing the dose of NPH insulin if she had a steroid injection and I advised her to increase the NPH at night, also.  She was still missing her insulin before lunch as she did not know when  she had a break to eat her lunch and did not have the insulin with her.  I strongly advised her to take it with her. - at today's visit, she appears to be overwhelmed with all her medical issues.  She has not been checking sugars since last visit but today her HbA1c is very high, even higher than before.  Upon questioning, she is taking lower doses of her regular insulin with breakfast and dinner (50% lower) than recommended at previous visits.  She is not sure why she decreased the doses, but she became tearful and mentioned that she is very frustrated with all of her medical conditions and may not return to see me or anybody else for diabetes care.  She does mention that this is not personal, but due to the fact that she is overwhelmed with her many medical issues.  I did explain that poor blood sugar control could influence her immune system and cause collateral damage including diverticulitis, pneumonia and other infections. -For now, she agrees to increase the dose of regular insulin back for breakfast and dinner and will also increase the dose before lunch.  Will keep the NPH dose the same.  She obtains NPH and regular insulin without prescription.  We discussed about possibly restarting Ozempic (through patient assistance), but she would like to avoid this due to diverticulitis. - I suggested to:  Patient Instructions  Please  change: Insulin Before breakfast Before  lunch Before dinner  Regular 25-30 12-15 25-30  NPH 40  40  Please inject the insulin 30 min before meals.  Please return to see me in 3-4 months.  - we checked her HbA1c: 11.7% (higher) - advised to check sugars at different times of the day - 4x a day, rotating check times - advised for yearly eye exams >> she is UTD - return to clinic in 3-4 months if she decides to return.  I did advise her to think well about either continuing to see me or seeing somebody else and do what she thinks it is best for her.   2. HL - Reviewed  latest lipid panel from 05/2019:233/165/52/151: LDL much higher than target, triglycerides slightly high No results found for: "CHOL", "HDL", "LDLCALC", "LDLDIRECT", "TRIG", "CHOLHDL" -She did not have a lipid panel at last visit with PCP but has an appointment with her soon. -She is not able to use a statin previous intolerance to 6 of them, reportedly  3. Myositis 2/2 statins -She was not able to tolerate 6 statins due to significant muscle aches  Philemon Kingdom, MD PhD [facility]

## 2022-02-15 NOTE — Patient Instructions (Addendum)
Please  change: Insulin Before breakfast Before lunch Before dinner  Regular 25-30 12-15 25-30  NPH 40  40  Please inject the insulin 30 min before meals.  Please return to see me in 3-4 months.

## 2022-02-20 ENCOUNTER — Ambulatory Visit (INDEPENDENT_AMBULATORY_CARE_PROVIDER_SITE_OTHER): Payer: Medicare HMO | Admitting: Family Medicine

## 2022-02-20 ENCOUNTER — Encounter: Payer: Self-pay | Admitting: Family Medicine

## 2022-02-20 VITALS — BP 140/82 | HR 104 | Temp 97.6°F | Ht 65.0 in

## 2022-02-20 DIAGNOSIS — G720 Drug-induced myopathy: Secondary | ICD-10-CM | POA: Diagnosis not present

## 2022-02-20 DIAGNOSIS — J40 Bronchitis, not specified as acute or chronic: Secondary | ICD-10-CM | POA: Diagnosis not present

## 2022-02-20 DIAGNOSIS — Z794 Long term (current) use of insulin: Secondary | ICD-10-CM | POA: Diagnosis not present

## 2022-02-20 DIAGNOSIS — I10 Essential (primary) hypertension: Secondary | ICD-10-CM | POA: Diagnosis not present

## 2022-02-20 DIAGNOSIS — E1165 Type 2 diabetes mellitus with hyperglycemia: Secondary | ICD-10-CM | POA: Diagnosis not present

## 2022-02-20 DIAGNOSIS — M5412 Radiculopathy, cervical region: Secondary | ICD-10-CM

## 2022-02-20 DIAGNOSIS — G43009 Migraine without aura, not intractable, without status migrainosus: Secondary | ICD-10-CM

## 2022-02-20 LAB — COMPREHENSIVE METABOLIC PANEL
ALT: 9 U/L (ref 0–35)
AST: 12 U/L (ref 0–37)
Albumin: 3.9 g/dL (ref 3.5–5.2)
Alkaline Phosphatase: 78 U/L (ref 39–117)
BUN: 14 mg/dL (ref 6–23)
CO2: 30 mEq/L (ref 19–32)
Calcium: 9.1 mg/dL (ref 8.4–10.5)
Chloride: 101 mEq/L (ref 96–112)
Creatinine, Ser: 0.79 mg/dL (ref 0.40–1.20)
GFR: 74.46 mL/min (ref >60.00)
Glucose, Bld: 105 mg/dL — ABNORMAL HIGH (ref 70–99)
Potassium: 3.3 mEq/L — ABNORMAL LOW (ref 3.5–5.1)
Sodium: 142 mEq/L (ref 135–145)
Total Bilirubin: 0.8 mg/dL (ref 0.2–1.2)
Total Protein: 6.8 g/dL (ref 6.0–8.3)

## 2022-02-20 LAB — CBC WITH DIFFERENTIAL/PLATELET
Basophils Absolute: 0.1 10*3/uL (ref 0.0–0.1)
Basophils Relative: 0.7 % (ref 0.0–3.0)
Eosinophils Absolute: 0.2 10*3/uL (ref 0.0–0.7)
Eosinophils Relative: 3 % (ref 0.0–5.0)
HCT: 40.5 % (ref 36.0–46.0)
Hemoglobin: 14 g/dL (ref 12.0–15.0)
Lymphocytes Relative: 30 % (ref 12.0–46.0)
Lymphs Abs: 2.4 10*3/uL (ref 0.7–4.0)
MCHC: 34.5 g/dL (ref 30.0–36.0)
MCV: 91.1 fl (ref 78.0–100.0)
Monocytes Absolute: 0.8 10*3/uL (ref 0.1–1.0)
Monocytes Relative: 10.1 % (ref 3.0–12.0)
Neutro Abs: 4.6 10*3/uL (ref 1.4–7.7)
Neutrophils Relative %: 56.2 % (ref 43.0–77.0)
Platelets: 273 10*3/uL (ref 150.0–400.0)
RBC: 4.44 Mil/uL (ref 3.87–5.11)
RDW: 13.9 % (ref 11.5–15.5)
WBC: 8.2 10*3/uL (ref 4.0–10.5)

## 2022-02-20 LAB — TSH: TSH: 1.72 u[IU]/mL (ref 0.35–5.50)

## 2022-02-20 LAB — LIPID PANEL
Cholesterol: 231 mg/dL — ABNORMAL HIGH (ref 0–200)
HDL: 50.1 mg/dL (ref >39.00)
LDL Cholesterol: 147 mg/dL — ABNORMAL HIGH (ref 0–99)
NonHDL: 180.48
Total CHOL/HDL Ratio: 5
Triglycerides: 165 mg/dL — ABNORMAL HIGH (ref 0.0–149.0)
VLDL: 33 mg/dL (ref 0.0–40.0)

## 2022-02-20 LAB — MAGNESIUM: Magnesium: 1.4 mg/dL — ABNORMAL LOW (ref 1.5–2.5)

## 2022-02-20 LAB — MICROALBUMIN / CREATININE URINE RATIO
Creatinine,U: 178.2 mg/dL
Microalb Creat Ratio: 1.3 mg/g (ref 0.0–30.0)
Microalb, Ur: 2.3 mg/dL — ABNORMAL HIGH (ref 0.0–1.9)

## 2022-02-20 MED ORDER — HYDROCHLOROTHIAZIDE 50 MG PO TABS: ORAL_TABLET | ORAL | 2 refills | Status: DC

## 2022-02-20 MED ORDER — METOPROLOL TARTRATE 50 MG PO TABS: 25.0000 mg | ORAL_TABLET | Freq: Two times a day (BID) | ORAL | 3 refills | Status: DC

## 2022-02-20 MED ORDER — HYDROCODONE-ACETAMINOPHEN 7.5-325 MG PO TABS: 1.0000 | ORAL_TABLET | Freq: Four times a day (QID) | ORAL | 0 refills | Status: DC | PRN

## 2022-02-20 MED ORDER — ACETAMINOPHEN-CODEINE 300-30 MG PO TABS: 1.0000 | ORAL_TABLET | Freq: Three times a day (TID) | ORAL | 0 refills | Status: DC | PRN

## 2022-02-20 MED ORDER — DOXYCYCLINE HYCLATE 100 MG PO TABS: 100.0000 mg | ORAL_TABLET | Freq: Two times a day (BID) | ORAL | 0 refills | Status: DC

## 2022-02-20 NOTE — Patient Instructions (Signed)
It was very nice to see you today!  Use the albuterol-rinse the blue thing   PLEASE NOTE:  If you had any lab tests please let us know if you have not heard back within a few days. You may see your results on MyChart before we have a chance to review them but we will give you a call once they are reviewed by [place]. If we ordered any referrals today, please let us know if you have not heard from their office within the next week.   Please try these tips to maintain a healthy lifestyle:  Eat most of your calories during the day when you are active. Eliminate processed foods including packaged sweets (pies, cakes, cookies), reduce intake of potatoes, white bread, white pasta, and white rice. Look for whole grain options, oat flour or almond flour.  Each meal should contain half fruits/vegetables, one quarter protein, and one quarter carbs (no bigger than a computer mouse).  Cut down on sweet beverages. This includes juice, soda, and sweet tea. Also watch fruit intake, though this is a healthier sweet option, it still contains natural sugar! Limit to 3 servings daily.  Drink at least 1 glass of water with each meal and aim for at least 8 glasses per day  Exercise at least 150 minutes every week.

## 2022-02-20 NOTE — Progress Notes (Signed)
Subjective:     Patient ID: [name], female    DOB: 11/20/1950, 72 y.o.   MRN: [id number]  Chief Complaint  Patient presents with   Follow-up    6 month follow-up for HTN Fasting    Cough    Productive cough at times since 01/26/22 causing drainage in throat    HPI  HTN-Pt is on HCTZ '25mg'$ , metoprolol '25mg'$  bid  Bp's running not checking.  No ha/dizziness/cp/palp/edema/sob.    No meds yet today.  DM type 2-intol statins.  Intol mult meds.  Saw endo on 1/12-A1C 11.7 .  Trying to get new glucometer-awaiting endo to approve.   Many meds cost prohibitive.  Buys insulin OTC.  Occ skips dose.   Just done w/medical URI since Dec-a lot of mucus still.  Hoarse.  Coughing a lot  albuterol-didn't realize still plenty of doses left-thought on 18 in container-tx'd 12/19-doxy and pred. Still sick Migraine-occ takes codeine or lortab 7.5(1.5 tabs of the '5mg'$  not work same and more drowsy).   Health Maintenance Due  Topic Date Due   Medicare Annual Wellness (AWV)  Never done   Diabetic kidney evaluation - Urine ACR  Never done   MAMMOGRAM  Never done   DEXA SCAN  Never done   DTaP/Tdap/Td (2 - Td or Tdap) 07/27/2019    Past Medical History:  Diagnosis Date   Arthritis    C. difficile colitis    Diabetes mellitus    Diverticulitis    Fibromyalgia    Hypercholesteremia    Hypertension    IBS (irritable bowel syndrome)    Migraines    Nasal fracture    Stein-Leventhal ovaries     Past Surgical History:  Procedure Laterality Date   CHOLECYSTECTOMY  1998   KNEE ARTHROSCOPY Right 2017   KNEE ARTHROSCOPY Left    LEFT HEART CATHETERIZATION WITH CORONARY ANGIOGRAM N/A 12/19/2011   Procedure: LEFT HEART CATHETERIZATION WITH CORONARY ANGIOGRAM;  Surgeon: Candee Furbish, MD;  Location: [facility] CATH LAB;  Service: Cardiovascular;  Laterality: N/A;   ROTATOR CUFF REPAIR Right    TONSILLECTOMY  1959    Outpatient Medications Prior to Visit  Medication Sig Dispense Refill   ACCU-CHEK  SMARTVIEW test strip Use 3x a day 300 each 4   acetaminophen (TYLENOL) 650 MG CR tablet 2 tablets as needed     albuterol (VENTOLIN HFA) 108 (90 Base) MCG/ACT inhaler Inhale 2 puffs into the lungs every 6 (six) hours as needed for wheezing or shortness of breath. 8 g 0   cholecalciferol (VITAMIN D) 1000 UNITS tablet Take 1,000 Units by mouth every evening.      insulin NPH Human (NOVOLIN N) 100 UNIT/ML injection Inject 0.3 mLs (30 Units total) into the skin 2 (two) times daily before a meal. 20 mL 0   Lancets (ACCU-CHEK MULTICLIX) lancets Use as instructed to test 3x daily DX E11.65 300 each 4   potassium chloride (KLOR-CON) 8 MEQ tablet Take by mouth daily.     PRESCRIPTION MEDICATION Patient receives a Steroid injection in the neck once a year with Dr. Mina Marble at [facility] pt is unable to recall the exact name and i have been unable to verify name from the Dr's office. Patient also states " it shoots up my blood sugars really high, but i still get it because i can't take anything else for my neck pain"     tiZANidine (ZANAFLEX) 4 MG tablet Take 1 tablet (4 mg total) by mouth every  6 (six) hours as needed for muscle spasms. 30 tablet 3   acetaminophen-codeine (TYLENOL #3) 300-30 MG tablet Take 1 tablet by mouth every 8 (eight) hours as needed for moderate pain. 60 tablet 0   doxycycline (VIBRA-TABS) 100 MG tablet Take 1 tablet (100 mg total) by mouth 2 (two) times daily. 20 tablet 0   hydrochlorothiazide (HYDRODIURIL) 50 MG tablet Take 1/2 (one-half) tablet by mouth twice daily 90 tablet 0   HYDROcodone-acetaminophen (NORCO/VICODIN) 5-325 MG tablet Take 1 tablet by mouth every 6 (six) hours as needed for severe pain. 15 tablet 0   metoprolol tartrate (LOPRESSOR) 50 MG tablet Take 0.5 tablets (25 mg total) by mouth 2 (two) times daily. 30 tablet 11   fluconazole (DIFLUCAN) 150 MG tablet Take 1 tablet (150 mg total) by mouth every three (3) days as needed. (Patient not taking: Reported on  01/22/2022) 2 tablet 0   metroNIDAZOLE (FLAGYL) 500 MG tablet Take 1 tablet (500 mg total) by mouth 3 (three) times daily. (Patient not taking: Reported on 02/20/2022) 30 tablet 0   No facility-administered medications prior to visit.    Allergies  Allergen Reactions   Ceftriaxone Hives, Itching and Other (See Comments)   Dicyclomine Hives    Hives, itching, swelling, vomiting, felt like something was sitting on chest   Erythromycin Anaphylaxis and Other (See Comments)   Iodinated Contrast Media Hives, Other (See Comments) and Shortness Of Breath   Amoxicillin-Pot Clavulanate Nausea And Vomiting    Nausea, vomiting, headache, dizziness. Hives on 08/2019 Other reaction(s): hives, Other Nausea, vomiting, headache, dizziness   pt states she can take other Penicillins just not augmentin Nausea, vomiting, headache, dizziness. Hives on 08/2019    Canagliflozin Rash and Other (See Comments)    Dehydration Dehydration Dehydration    Statins Other (See Comments)    Severe muscle pain/weakness Muscle pain/ weekness    Sulfa Antibiotics Other (See Comments)    blackout PER PT SHE BLACKS OUT    Cipro [Ciprofloxacin Hcl] Other (See Comments)   Corticosteroids Other (See Comments)   Insulin Glargine Other (See Comments)    Severe ankle swelling   Metformin And Related Diarrhea and Nausea And Vomiting    Violently ill for 2 yrs   Metformin Hcl Other (See Comments)   Scallops [Shellfish Allergy] Diarrhea and Nausea And Vomiting    Severe GI upset   Tramadol Hcl Other (See Comments)   Nsaids Other (See Comments)    Stomach problems Stomach problems    Tramadol Rash   ROS neg/noncontributory except as noted HPI/below Working on getting to work from [employer]      Objective:     BP (!) 140/82 (BP Location: Left Arm, Patient Position: Sitting, Cuff Size: Normal)   Pulse (!) 104   Temp 97.6 F (36.4 C) (Temporal)   Ht '5\' 5"'$  (1.651 m)   SpO2 99%   BMI 31.28 kg/m  Wt  Readings from Last 3 Encounters:  08/08/21 188 lb (85.3 kg)  03/17/21 188 lb (85.3 kg)  03/06/21 189 lb (85.7 kg)    Physical Exam   Gen: WDWN NAD HEENT: NCAT, conjunctiva not injected, sclera nonicteric TM WNL B, OP dry, no exudates.  congested NECK:  supple, no thyromegaly, no nodes, no carotid bruits CARDIAC: tachy RRR, S1S2+, no murmur. DP 1+B LUNGS: CTAB. No wheezes-coughing ABDOMEN:  BS+, soft, NTND, No HSM, no masses EXT:  no edema QDI:YMEB NEURO: A&O x3.  CN II-XII intact.  PSYCH: normal mood. Good eye contact  PDMP checked.  Diabetic Foot Exam - Simple   Simple Foot Form Diabetic Foot exam was performed with the following findings: Yes 02/20/2022  8:30 AM  Visual Inspection No deformities, no ulcerations, no other skin breakdown bilaterally: Yes Sensation Testing Intact to touch and monofilament testing bilaterally: Yes Pulse Check Posterior Tibialis and Dorsalis pulse intact bilaterally: Yes Comments         Assessment & Plan:   Problem List Items Addressed This Visit       Cardiovascular and Mediastinum   Essential hypertension - Primary   Relevant Medications   hydrochlorothiazide (HYDRODIURIL) 50 MG tablet   metoprolol tartrate (LOPRESSOR) 50 MG tablet   Other Relevant Orders   Comprehensive metabolic panel   TSH   CBC with Differential/Platelet   Magnesium   Microalbumin / creatinine urine ratio   Lipid panel   Migraine without aura and without status migrainosus, not intractable   Relevant Medications   hydrochlorothiazide (HYDRODIURIL) 50 MG tablet   acetaminophen-codeine (TYLENOL #3) 300-30 MG tablet   metoprolol tartrate (LOPRESSOR) 50 MG tablet   HYDROcodone-acetaminophen (NORCO) 7.5-325 MG tablet     Endocrine   Type 2 diabetes mellitus with hyperglycemia, with long-term current use of insulin (HCC)   Relevant Orders   Comprehensive metabolic panel   TSH   CBC with Differential/Platelet   Magnesium   Microalbumin / creatinine urine  ratio   Lipid panel     Nervous and Auditory   Cervical radiculopathy   Relevant Medications   acetaminophen-codeine (TYLENOL #3) 300-30 MG tablet     Musculoskeletal and Integument   Drug-induced myopathy   Other Visit Diagnoses     Bronchitis          HTN-chronic.  Controlled.  Hasn't taken meds yet today.  Renewed hctz-takes 1/2 tab of '50mg'$ . And metoprolol '25mg'$  bid.  Check cbc,cmp,tsh,mg,lipids DM type 2-chronic.  Not controlled.  Seeing endo.  Some issues w/cost of meds so on OTC 70/30 insulin.   A1C 11.7.  foot exam done Stain induced myopathy so not on statin.  Check lipids.  ? Candidate for injectables Bronchitis-mult drug allergies.  Doxy '100mg'$  bid.  No pred d/t sugars.  She now knows the albuterol has plenty in it so will use.   Cervical radiculopathy/migraine-needs refill on T3 and lortab 7.'5mg'$ .  PDMP checked.  Renewed.    Meds ordered this encounter  Medications   doxycycline (VIBRA-TABS) 100 MG tablet    Sig: Take 1 tablet (100 mg total) by mouth 2 (two) times daily.    Dispense:  20 tablet    Refill:  0   hydrochlorothiazide (HYDRODIURIL) 50 MG tablet    Sig: Take 1/2 (one-half) tablet by mouth twice daily    Dispense:  90 tablet    Refill:  2   acetaminophen-codeine (TYLENOL #3) 300-30 MG tablet    Sig: Take 1 tablet by mouth every 8 (eight) hours as needed for moderate pain.    Dispense:  60 tablet    Refill:  0   metoprolol tartrate (LOPRESSOR) 50 MG tablet    Sig: Take 0.5 tablets (25 mg total) by mouth 2 (two) times daily.    Dispense:  90 tablet    Refill:  3   HYDROcodone-acetaminophen (NORCO) 7.5-325 MG tablet    Sig: Take 1 tablet by mouth every 6 (six) hours as needed for moderate pain.    Dispense:  20 tablet    Refill:  0    Wellington Hampshire, MD

## 2022-02-21 NOTE — Progress Notes (Signed)
Potassium and magnesium are both low-needs to be taking both.  Is she getting the potassium over the counter?  Needs to increase it or get rx.  Needs to take magnesium '400mg'$  daily as well. 2.  For the cholesterol, does she want me to explore the injectable cholesterol med?

## 2022-03-04 ENCOUNTER — Ambulatory Visit (INDEPENDENT_AMBULATORY_CARE_PROVIDER_SITE_OTHER): Payer: Medicare HMO | Admitting: Family Medicine

## 2022-03-04 ENCOUNTER — Encounter: Payer: Self-pay | Admitting: Family Medicine

## 2022-03-04 VITALS — BP 136/69 | HR 76 | Temp 97.7°F | Ht 65.0 in

## 2022-03-04 DIAGNOSIS — R600 Localized edema: Secondary | ICD-10-CM | POA: Diagnosis not present

## 2022-03-04 DIAGNOSIS — M79661 Pain in right lower leg: Secondary | ICD-10-CM

## 2022-03-04 NOTE — Progress Notes (Signed)
Subjective:     Patient ID: [name], female    DOB: March 15, 1950, 72 y.o.   MRN: [id number]  Chief Complaint  Patient presents with   Spasms    Pt states spasms started the weekend before last     HPI Past 2 wks, spasms-taking otc K bid now as was 3.3(but pt had been off it for 5 days prior).Pain was R calf.-was "hard" and spasmed.  More swelling than usual. Has had this before but swelling still there.  Pain some Better today. Dull ache today.  Behind knee cap and calf.  Ankle swollen.  Hasn't started Mg yet. 2.  Still coughing-hoarse.  But better. Approved to work remotely long term.  Health Maintenance Due  Topic Date Due   Medicare Annual Wellness (AWV)  Never done   DTaP/Tdap/Td (2 - Td or Tdap) 07/27/2019    Past Medical History:  Diagnosis Date   Arthritis    C. difficile colitis    Diabetes mellitus    Diverticulitis    Fibromyalgia    Hypercholesteremia    Hypertension    IBS (irritable bowel syndrome)    Migraines    Nasal fracture    Stein-Leventhal ovaries     Past Surgical History:  Procedure Laterality Date   CHOLECYSTECTOMY  1998   KNEE ARTHROSCOPY Right 2017   KNEE ARTHROSCOPY Left    LEFT HEART CATHETERIZATION WITH CORONARY ANGIOGRAM N/A 12/19/2011   Procedure: LEFT HEART CATHETERIZATION WITH CORONARY ANGIOGRAM;  Surgeon: Candee Furbish, MD;  Location: [facility] CATH LAB;  Service: Cardiovascular;  Laterality: N/A;   ROTATOR CUFF REPAIR Right    TONSILLECTOMY  1959    Outpatient Medications Prior to Visit  Medication Sig Dispense Refill   ACCU-CHEK SMARTVIEW test strip Use 3x a day 300 each 4   acetaminophen (TYLENOL) 650 MG CR tablet 2 tablets as needed     acetaminophen-codeine (TYLENOL #3) 300-30 MG tablet Take 1 tablet by mouth every 8 (eight) hours as needed for moderate pain. 60 tablet 0   albuterol (VENTOLIN HFA) 108 (90 Base) MCG/ACT inhaler Inhale 2 puffs into the lungs every 6 (six) hours as needed for wheezing or shortness of breath. 8 g 0    cholecalciferol (VITAMIN D) 1000 UNITS tablet Take 1,000 Units by mouth every evening.      doxycycline (VIBRA-TABS) 100 MG tablet Take 1 tablet (100 mg total) by mouth 2 (two) times daily. 20 tablet 0   fluconazole (DIFLUCAN) 150 MG tablet Take 1 tablet (150 mg total) by mouth every three (3) days as needed. 2 tablet 0   hydrochlorothiazide (HYDRODIURIL) 50 MG tablet Take 1/2 (one-half) tablet by mouth twice daily 90 tablet 2   HYDROcodone-acetaminophen (NORCO) 7.5-325 MG tablet Take 1 tablet by mouth every 6 (six) hours as needed for moderate pain. 20 tablet 0   insulin NPH Human (NOVOLIN N) 100 UNIT/ML injection Inject 0.3 mLs (30 Units total) into the skin 2 (two) times daily before a meal. 20 mL 0   Lancets (ACCU-CHEK MULTICLIX) lancets Use as instructed to test 3x daily DX E11.65 300 each 4   metoprolol tartrate (LOPRESSOR) 50 MG tablet Take 0.5 tablets (25 mg total) by mouth 2 (two) times daily. 90 tablet 3   potassium chloride (KLOR-CON) 8 MEQ tablet Take by mouth daily.     PRESCRIPTION MEDICATION Patient receives a Steroid injection in the neck once a year with Dr. Mina Marble at [facility] pt is unable to recall the exact name  and i have been unable to verify name from the Dr's office. Patient also states " it shoots up my blood sugars really high, but i still get it because i can't take anything else for my neck pain"     tiZANidine (ZANAFLEX) 4 MG tablet Take 1 tablet (4 mg total) by mouth every 6 (six) hours as needed for muscle spasms. 30 tablet 3   No facility-administered medications prior to visit.    Allergies  Allergen Reactions   Ceftriaxone Hives, Itching and Other (See Comments)   Dicyclomine Hives    Hives, itching, swelling, vomiting, felt like something was sitting on chest   Erythromycin Anaphylaxis and Other (See Comments)   Iodinated Contrast Media Hives, Other (See Comments) and Shortness Of Breath   Amoxicillin-Pot Clavulanate Nausea And Vomiting    Nausea,  vomiting, headache, dizziness. Hives on 08/2019 Other reaction(s): hives, Other Nausea, vomiting, headache, dizziness   pt states she can take other Penicillins just not augmentin Nausea, vomiting, headache, dizziness. Hives on 08/2019    Canagliflozin Rash and Other (See Comments)    Dehydration Dehydration Dehydration    Statins Other (See Comments)    Severe muscle pain/weakness Muscle pain/ weekness    Sulfa Antibiotics Other (See Comments)    blackout PER PT SHE BLACKS OUT    Cipro [Ciprofloxacin Hcl] Other (See Comments)   Corticosteroids Other (See Comments)   Insulin Glargine Other (See Comments)    Severe ankle swelling   Metformin And Related Diarrhea and Nausea And Vomiting    Violently ill for 2 yrs   Metformin Hcl Other (See Comments)   Scallops [Shellfish Allergy] Diarrhea and Nausea And Vomiting    Severe GI upset   Tramadol Hcl Other (See Comments)   Nsaids Other (See Comments)    Stomach problems Stomach problems    Tramadol Rash   ROS neg/noncontributory except as noted HPI/below      Objective:     BP 136/69 (BP Location: Left Arm, Patient Position: Sitting)   Pulse 76   Temp 97.7 F (36.5 C) (Temporal)   Ht '5\' 5"'$  (1.651 m)   SpO2 99%   BMI 31.28 kg/m  Wt Readings from Last 3 Encounters:  08/08/21 188 lb (85.3 kg)  03/17/21 188 lb (85.3 kg)  03/06/21 189 lb (85.7 kg)    Physical Exam   Gen: WDWN NAD HEENT: NCAT, conjunctiva not injected, sclera nonicteric EXT:  chronic edema R leg.   R 44cm and L 41cm. Wearing compression stockings. Edema R ankle. MSK: cane.  Some tenderness R calf. Homen's "tight" feeling.  NEURO: A&O x3.  CN II-XII intact.  PSYCH: normal mood. Good eye contact     Assessment & Plan:   Problem List Items Addressed This Visit   None Visit Diagnoses     Right calf pain    -  Primary   Relevant Orders   VAS [place] LOWER EXTREMITY VENOUS (DVT)   Localized edema       Relevant Orders   VAS [place] LOWER EXTREMITY  VENOUS (DVT)     1.  Pain right calf with some localized edema.  Some of this may be somewhat chronic, however pain is the problem.  May be muscle spasm, Baker's cyst, osteoarthritis of the knee/ankle, "shredded tendons" of her Achilles, sciatica, DVT.  Will check right lower extremity Doppler.  She will be going on a long car trip over the weekend.  Advised to stop and get out frequently.  Sample of  Xarelto 15 mg twice daily given to the patient.  Discussed risks.  We decided to treat as we are unable to get a Doppler today and she would have to go to the emergency room.  She preferred treatment until we get a negative ultrasound.  Also, do stretches, continue compression stockings.  See Ortho.  No orders of the defined types were placed in this encounter.   Wellington Hampshire, MD

## 2022-03-05 ENCOUNTER — Ambulatory Visit (HOSPITAL_COMMUNITY)
Admission: RE | Admit: 2022-03-05 | Discharge: 2022-03-05 | Disposition: A | Discharge status: Home / Self Care | Payer: Medicare HMO | Source: Ambulatory Visit | Attending: Family Medicine | Admitting: Family Medicine

## 2022-03-05 DIAGNOSIS — R600 Localized edema: Secondary | ICD-10-CM | POA: Insufficient documentation

## 2022-03-05 DIAGNOSIS — M79661 Pain in right lower leg: Secondary | ICD-10-CM | POA: Insufficient documentation

## 2022-03-05 NOTE — Progress Notes (Signed)
RLE venous duplex has been completed.  Preliminary results called to [facility], RN.  Russell to contact patient and schedule appointment for DVT clinic.   Results can be found under chart review under CV PROC. 03/05/2022 4:48 PM Riata Ikeda RVT, RDMS

## 2022-03-06 ENCOUNTER — Ambulatory Visit (HOSPITAL_COMMUNITY)
Admission: RE | Admit: 2022-03-06 | Discharge: 2022-03-06 | Disposition: A | Discharge status: Home / Self Care | Payer: Medicare HMO | Source: Ambulatory Visit | Attending: Surgery | Admitting: Surgery

## 2022-03-06 ENCOUNTER — Other Ambulatory Visit (HOSPITAL_COMMUNITY): Payer: Self-pay

## 2022-03-06 ENCOUNTER — Encounter (HOSPITAL_COMMUNITY): Payer: Medicare HMO

## 2022-03-06 ENCOUNTER — Encounter (HOSPITAL_COMMUNITY): Payer: Self-pay | Admitting: Student-PharmD

## 2022-03-06 VITALS — BP 141/67 | HR 82

## 2022-03-06 DIAGNOSIS — I82411 Acute embolism and thrombosis of right femoral vein: Secondary | ICD-10-CM

## 2022-03-06 MED ORDER — XARELTO VTE STARTER PACK 15 & 20 MG PO TBPK
ORAL_TABLET | ORAL | 0 refills | Status: DC
  Filled 2022-03-06: qty 51, 30d supply, fill #0

## 2022-03-06 MED ORDER — RIVAROXABAN 20 MG PO TABS: 20.0000 mg | ORAL_TABLET | Freq: Every day | ORAL | 1 refills | Status: DC

## 2022-03-06 NOTE — Patient Instructions (Signed)
-  Continue rivaroxaban (Xarelto) 15 mg twice daily with food for 21 days followed by 20 mg daily with food. -Your refills have been sent to your [facility]. You will likely need to call the pharmacy to ask them to fill this when you start to run low on your current supply.  -It is important to take your medication around the same time every day.  -Avoid NSAIDs like ibuprofen (Advil, Motrin) and naproxen (Aleve) as well as aspirin doses over 100 mg daily. -Tylenol (acetaminophen) is the preferred over the counter pain medication to lower the risk of bleeding. -Be sure to alert all of your health care providers that you are taking an anticoagulant prior to starting a new medication or having a procedure. -Monitor for signs and symptoms of bleeding (abnormal bruising, prolonged bleeding, nose bleeds, bleeding from gums, discolored urine, black tarry stools). If you have fallen and hit your head OR if your bleeding is severe or not stopping, seek emergency care.  -Go to the emergency room if emergent signs and symptoms of new clot occur (new or worse swelling and pain in an arm or leg, shortness of breath, chest pain, fast or irregular heartbeats, lightheadedness, dizziness, fainting, coughing up blood) or if you experience a significant color change (pale or blue) in the extremity that has the DVT.  -We recommend you wear compression stockings (20-30 mmHg) as long as you are having swelling or pain. Be sure to purchase the correct size and take them off at night.   Your next visit is on February 29th at [facility] [place], [place], [place] [address] Enter the hospital through Entr[address] off [facility] and pull up to the [place] entrance to the free [place] parking.  Check in for your appointment at the [place].   If you have any questions or need to reschedule an appointment, please call [phone].  If you are having an emergency,  call 911 or present to the nearest emergency room.   What is a DVT?  -Deep vein thrombosis (DVT) is a condition in which a blood clot forms in a vein of the deep venous system which can occur in the lower leg, thigh, pelvis, arm, or neck. This condition is serious and can be life-threatening if the clot travels to the arteries of the lungs and causing a blockage (pulmonary embolism, PE). A DVT can also damage veins in the leg, which can lead to long-term venous disease, leg pain, swelling, discoloration, and ulcers or sores (post-thrombotic syndrome).  -Treatment may include taking an anticoagulant medication to prevent more clots from forming and the current clot from growing, wearing compression stockings, and/or surgical procedures to remove or dissolve the clot.

## 2022-03-06 NOTE — Progress Notes (Signed)
DVT Clinic Note  Name: [name]     MRN: [id number]     DOB: 19-Mar-1950     Sex: female  PCP: Tawnya Crook, MD  Today's Visit: Visit Information: Initial Visit  Referred to DVT Clinic by: Dr. Cherlynn Kaiser (PCP)  Referred to CPP by: Dr. Trula Slade Reason for referral:  Chief Complaint  Patient presents with   DVT   HISTORY OF PRESENT ILLNESS:  [name] is a 72 y.o. female who presents after diagnosis of DVT involving R distal femoral, popliteal, peroneal, and posterior tibial veins for medication management. Patient reports that she started having pain and swelling in her R leg on 02/24/22. Says it "felt like had a stone in calf." Endorses muscle pain and arthritis at baseline, so she didn't think much of it. The pain worsened so she saw her PCP 03/04/22 who gave her samples of Xarelto 15 mg with instruction to take it twice daily since she could not be seen for an outpatient ultrasound until the next day. Doppler was positive for DVT so Xarelto was continued with plan for follow up in the DVT Clinic.   Today, patient reports no missed doses of Xarelto. Notes improving pain in the R leg but still having swelling. She is wearing compression stockings today and walking with a cane. She has no family history of blood clots. She came down with URI mid-December. Reports she was very sick and did not get out of bed for 5 days. Says she developed bronchitis and potentially pneumonia and was treated with antibiotics which she just finished this week. Still having some lingering symptoms. 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 Prior to recent illness was much more active. She has a 5 hour drive coming up to [place] to visit family and asks if this is okay. She plans to stop every hour to get out and walk around.   Positive Thrombotic Risk Factors: Bed rest >72 hours within 3 month, Older Age, Recent Illness  (sick with bronchitis/PNA >1 month in duration) Bleeding Risk Factors: Anticoagulant therapy, Age >65 years  Negative Thrombotic Risk Factors: Previous VTE, Recent surgery (within 3 months), Recent trauma (within 3 months), Recent admission to hospital with acute illness (within 3 months), Paralysis, paresis, or recent plaster cast immobilization of lower extremity, Central venous catheterization, Pregnancy, Testosterone therapy, Estrogen therapy, Within 6 weeks postpartum, Recent cesarean section (within 3 months), Erythropoiesis-stimulating agent, Obesity, Sedentary journey lasting >8 hours within 4 weeks, Smoking, Recent COVID diagnosis (within 3 months), Active cancer, Non-malignant, chronic inflammatory condition, Known thrombophilic condition  Rx Insurance Coverage: Medicare Rx Affordability: Xarelto is $47 for a 30 day supply. Filled today for $0 with one time free savings card.  Preferred Pharmacy: Filled starter pack today at [facility]. Refills sent to her preferred [facility] on [address].  Past Medical History:  Diagnosis Date   Arthritis    C. difficile colitis    Diabetes mellitus    Diverticulitis    Fibromyalgia    Hypercholesteremia    Hypertension    IBS (irritable bowel syndrome)    Migraines    Nasal fracture    Stein-Leventhal ovaries     Past Surgical History:  Procedure Laterality Date   CHOLECYSTECTOMY  1998   KNEE ARTHROSCOPY Right 2017   KNEE ARTHROSCOPY Left    LEFT HEART CATHETERIZATION  WITH CORONARY ANGIOGRAM N/A 12/19/2011   Procedure: LEFT HEART CATHETERIZATION WITH CORONARY ANGIOGRAM;  Surgeon: Candee Furbish, MD;  Location: [facility] CATH LAB;  Service: Cardiovascular;  Laterality: N/A;   ROTATOR CUFF REPAIR Right    TONSILLECTOMY  1959    Social History   Socioeconomic History   Marital status: Single    Spouse name: Not on file   Number of children: 0   Years of education: Some college   Highest education level: Not on file   Occupational History   Occupation: accounting    Comment: [employer]  Tobacco Use   Smoking status: Never   Smokeless tobacco: Never  Vaping Use   Vaping Use: Never used  Substance and Sexual Activity   Alcohol use: Yes    Comment: rare-once a year at a celebration   Drug use: No   Sexual activity: Not on file  Other Topics Concern   Not on file  Social History Narrative   Lives alone w/ her 3 cats   Right-handed   Caffeine: 2 cups coffee/tea per day      Adoptive Dad was 1/2 bro to pt birth mother   Social Determinants of Health   Financial Resource Strain: Not on file  Food Insecurity: Not on file  Transportation Needs: Not on file  Physical Activity: Not on file  Stress: Not on file  Social Connections: Not on file  Intimate Partner Violence: Not on file    Family History  Adopted: Yes  Problem Relation Age of Onset   Failure to thrive Mother    Heart failure Father    Neuropathy Neg Hx     Allergies as of 03/06/2022 - Review Complete 03/06/2022  Allergen Reaction Noted   Ceftriaxone Hives, Itching, and Other (See Comments) 08/04/2019   Dicyclomine Hives 04/02/2021   Erythromycin Anaphylaxis and Other (See Comments) 06/04/2016   Iodinated contrast media Hives, Other (See Comments), and Shortness Of Breath 05/12/2011   Amoxicillin-pot clavulanate Nausea And Vomiting 05/12/2011   Canagliflozin Rash and Other (See Comments) 06/04/2016   Statins Other (See Comments) 07/07/2015   Sulfa antibiotics Other (See Comments) 09/09/2013   Cipro [ciprofloxacin hcl] Other (See Comments) 05/12/2020   Corticosteroids Other (See Comments) 05/12/2020   Insulin glargine Other (See Comments) 05/12/2017   Metformin and related Diarrhea and Nausea And Vomiting 06/04/2016   Metformin hcl Other (See Comments) 05/12/2020   Scallops [shellfish allergy] Diarrhea and Nausea And Vomiting 08/02/2019   Tramadol hcl Other (See Comments) 05/12/2020   Nsaids Other (See Comments)  07/07/2015   Tramadol Rash 07/07/2017    Current Outpatient Medications on File Prior to Encounter  Medication Sig Dispense Refill   acetaminophen (TYLENOL) 650 MG CR tablet 2 tablets as needed     acetaminophen-codeine (TYLENOL #3) 300-30 MG tablet Take 1 tablet by mouth every 8 (eight) hours as needed for moderate pain. 60 tablet 0   albuterol (VENTOLIN HFA) 108 (90 Base) MCG/ACT inhaler Inhale 2 puffs into the lungs every 6 (six) hours as needed for wheezing or shortness of breath. 8 g 0   BERBERINE CHLORIDE PO Take 1 capsule by mouth 2 (two) times daily.     cholecalciferol (VITAMIN D) 1000 UNITS tablet Take 1,000 Units by mouth every evening.      CHROMIUM PO Take 1 tablet by mouth daily.     hydrochlorothiazide (HYDRODIURIL) 50 MG tablet Take 1/2 (one-half) tablet by mouth twice daily 90 tablet 2   HYDROcodone-acetaminophen (NORCO) 7.5-325 MG tablet Take  1 tablet by mouth every 6 (six) hours as needed for moderate pain. 20 tablet 0   insulin NPH Human (NOVOLIN N) 100 UNIT/ML injection Inject 0.3 mLs (30 Units total) into the skin 2 (two) times daily before a meal. (Patient taking differently: Inject 40 Units into the skin 2 (two) times daily before a meal.) 20 mL 0   Magnesium 400 MG CAPS Take 400 mg by mouth daily.     metoprolol tartrate (LOPRESSOR) 50 MG tablet Take 0.5 tablets (25 mg total) by mouth 2 (two) times daily. 90 tablet 3   potassium chloride (KLOR-CON) 8 MEQ tablet Take 8 mEq by mouth 2 (two) times daily.     tiZANidine (ZANAFLEX) 4 MG tablet Take 1 tablet (4 mg total) by mouth every 6 (six) hours as needed for muscle spasms. 30 tablet 3   ACCU-CHEK SMARTVIEW test strip Use 3x a day 300 each 4   Lancets (ACCU-CHEK MULTICLIX) lancets Use as instructed to test 3x daily DX E11.65 300 each 4   PRESCRIPTION MEDICATION Patient receives a Steroid injection in the neck once a year with Dr. Mina Marble at [facility] pt is unable to recall the exact name and i have been unable to  verify name from the Dr's office. Patient also states " it shoots up my blood sugars really high, but i still get it because i can't take anything else for my neck pain"     [DISCONTINUED] gabapentin (NEURONTIN) 100 MG capsule Take 1 capsule (100 mg total) by mouth 3 (three) times daily. (Patient not taking: Reported on 07/06/2019) 90 capsule 12   No current facility-administered medications on file prior to encounter.   REVIEW OF SYSTEMS:  Review of Systems  Respiratory:  Negative for cough and shortness of breath.   Cardiovascular:  Positive for leg swelling. Negative for chest pain.  Musculoskeletal:  Positive for myalgias.  Neurological:  Negative for dizziness and tingling.   PHYSICAL EXAMINATION:  Vitals:   03/06/22 1338  BP: (!) 141/67  Pulse: 82  SpO2: 99%    Physical Exam Cardiovascular:     Rate and Rhythm: Normal rate.  Pulmonary:     Effort: Pulmonary effort is normal.  Musculoskeletal:     Right lower leg: Edema (2+ pitting edema) present.     Left lower leg: No edema.  Psychiatric:        Mood and Affect: Mood normal.        Behavior: Behavior normal.        Thought Content: Thought content normal.   Villalta Score for Post-Thrombotic Syndrome: Pain: Mild Cramps: Mild Heaviness: Mild Paresthesia: Absent Pruritus: Absent Pretibial Edema: Moderate Skin Induration: Absent Hyperpigmentation: Absent Redness: Absent Venous Ectasia: Mild Pain on calf compression: Mild Villalta Preliminary Score: 7 Is venous ulcer present?: No If venous ulcer is present and score is <15, then 15 points total are assigned: Absent Villalta Total Score: 7  LABS:  CBC     Component Value Date/Time   WBC 8.2 02/20/2022 0837   RBC 4.44 02/20/2022 0837   HGB 14.0 02/20/2022 0837   HGB 13.6 01/06/2017 1146   HCT 40.5 02/20/2022 0837   HCT 41.3 01/06/2017 1146   PLT 273.0 02/20/2022 0837   PLT 232 01/06/2017 1146   MCV 91.1 02/20/2022 0837   MCV 91 01/06/2017 1146   MCH 30.2  03/17/2021 1521   MCHC 34.5 02/20/2022 0837   RDW 13.9 02/20/2022 0837   RDW 14.3 01/06/2017 1146   LYMPHSABS 2.4  02/20/2022 0837   MONOABS 0.8 02/20/2022 0837   EOSABS 0.2 02/20/2022 0837   BASOSABS 0.1 02/20/2022 0837    Hepatic Function      Component Value Date/Time   PROT 6.8 02/20/2022 0837   PROT 6.6 01/06/2017 1146   ALBUMIN 3.9 02/20/2022 0837   ALBUMIN 4.0 01/06/2017 1146   AST 12 02/20/2022 0837   ALT 9 02/20/2022 0837   ALKPHOS 78 02/20/2022 0837   BILITOT 0.8 02/20/2022 0837   BILITOT 0.4 01/06/2017 1146    Renal Function   Lab Results  Component Value Date   CREATININE 0.79 02/20/2022   CREATININE 0.85 04/02/2021   CREATININE 0.98 03/17/2021    CrCl cannot be calculated (Unknown ideal weight.).   [facility] Vascular Lab Studies:  03/05/22 VAS [place] LOWER EXTREMITY VENOUS LEFT (DVT) Summary:  RIGHT:  - Findings consistent with acute deep vein thrombosis involving the right  femoral vein, right popliteal vein, right peroneal veins, and right  posterior tibial veins.  - No cystic structure found in the popliteal fossa.    LEFT:  - No evidence of common femoral vein obstruction.   ASSESSMENT: Location of DVT: Right femoral vein, Right popliteal vein, Right distal vein Cause of DVT: provoked by a transient risk factor   PLAN: -Continue rivaroxaban (Xarelto) 15 mg twice daily with food for 21 days followed by 20 mg daily with food. -Expected duration of therapy: 3 months. Therapy started on 03/04/22. -Patient educated on purpose, proper use and potential adverse effects of rivaroxaban (Xarelto). -Discussed importance of taking medication around the same time every day. -Advised patient of medications to avoid (NSAIDs, aspirin doses >100 mg daily). -Educated that Tylenol (acetaminophen) is the preferred analgesic to lower the risk of bleeding. -Advised patient to alert all providers of anticoagulation therapy prior to starting a new medication or having a  procedure. -Emphasized importance of monitoring for signs and symptoms of bleeding (abnormal bruising, prolonged bleeding, nose bleeds, bleeding from gums, discolored urine, black tarry stools). -Educated patient to present to the ED if emergent signs and symptoms of new thrombosis occur. -Counseled patient to wear compression stockings daily, removing at night. -Counseled patient that her upcoming drive to [place] should be fine. Encouraged her to wear her compression stockings and get out of car to move around as able.   Follow up: 1 month in DVT [place], PharmD, [place], CPP Deep Vein Thrombosis Clinic Clinical Pharmacist Practitioner Office: [phone]

## 2022-04-04 ENCOUNTER — Encounter (HOSPITAL_COMMUNITY): Payer: Self-pay

## 2022-04-04 ENCOUNTER — Ambulatory Visit (HOSPITAL_COMMUNITY)
Admission: RE | Admit: 2022-04-04 | Discharge: 2022-04-04 | Disposition: A | Discharge status: Home / Self Care | Payer: Medicare HMO | Source: Ambulatory Visit | Attending: Vascular Surgery | Admitting: Vascular Surgery

## 2022-04-04 VITALS — BP 147/72 | HR 76

## 2022-04-04 DIAGNOSIS — I82411 Acute embolism and thrombosis of right femoral vein: Secondary | ICD-10-CM | POA: Diagnosis not present

## 2022-04-04 NOTE — Patient Instructions (Signed)
-  Continue rivaroxaban (Xarelto) 20 mg daily with food. -It is important to take your medication around the same time every day.  -Avoid NSAIDs like ibuprofen (Advil, Motrin) and naproxen (Aleve) as well as aspirin doses over 100 mg daily. -Tylenol (acetaminophen) is the preferred over the counter pain medication to lower the risk of bleeding. -Be sure to alert all of your health care providers that you are taking an anticoagulant prior to starting a new medication or having a procedure. -Monitor for signs and symptoms of bleeding (abnormal bruising, prolonged bleeding, nose bleeds, bleeding from gums, discolored urine, black tarry stools). If you have fallen and hit your head OR if your bleeding is severe or not stopping, seek emergency care.  -Go to the emergency room if emergent signs and symptoms of new clot occur (new or worse swelling and pain in an arm or leg, shortness of breath, chest pain, fast or irregular heartbeats, lightheadedness, dizziness, fainting, coughing up blood) or if you experience a significant color change (pale or blue) in the extremity that has the DVT.  -We recommend you wear compression stockings as long as you are having swelling or pain. Be sure to purchase the correct size and take them off at night.   Your next visit is on Monday April 29 at 10am (it's okay if you're running a little late from your PCP appt).  [facility] [place], [place], [place] [address] Enter the hospital through [address]tr[address] off [address] and pull up to the [place] entrance to the free [place] parking.  Check in for your appointment at the [place].   If you have any questions or need to reschedule an appointment, please call [phone] [facility].  If you are having an emergency, call 911 or present to the nearest emergency room.   What is a DVT?  -Deep vein thrombosis (DVT) is a condition in which a blood clot forms in a vein  of the deep venous system which can occur in the lower leg, thigh, pelvis, arm, or neck. This condition is serious and can be life-threatening if the clot travels to the arteries of the lungs and causing a blockage (pulmonary embolism, PE). A DVT can also damage veins in the leg, which can lead to long-term venous disease, leg pain, swelling, discoloration, and ulcers or sores (post-thrombotic syndrome).  -Treatment may include taking an anticoagulant medication to prevent more clots from forming and the current clot from growing, wearing compression stockings, and/or surgical procedures to remove or dissolve the clot.

## 2022-04-04 NOTE — Progress Notes (Signed)
DVT Clinic Note  Name: [name]     MRN: [id number]     DOB: [birth year]     Sex: female  PCP: Tawnya Crook, MD  Today's Visit: Visit Information: Follow Up Visit  Referred to DVT Clinic by: Dr. Cherlynn Kaiser (PCP)  Referred to CPP by: Dr. Stanford Breed Reason for referral:  Chief Complaint  Patient presents with   Med Management - DVT   HISTORY OF PRESENT ILLNESS:  [name] is a 72 y.o. female with PMH HTN, T2DM, HLD, fibromyalgia, who presents for follow up medication management after diagnosis of DVT involving R distal femoral, popliteal, peroneal, and posterior tibial veins on 03/05/22. Last seen in DVT Clinic 03/06/22 at which time Xarelto starter pack was initiated. DVT thought to be provoked by >1 month illness with bronchitis/PNA and ~5 day bed rest leading up to onset of DVT symptoms.   Today patient reports her RLE pain and swelling has significantly improved from last month. Denies abnormal bleeding. Has noticed slight bruising on RLE near medial ankle. Denies missed doses of Xarelto. Endorses wearing compression stockings daily as she has noticed a big improvement in swelling with these. Is wearing them today. She works a Designer, multimedia, so she has been getting up every hour to walk around her house to stay active throughout the day.   Positive Thrombotic Risk Factors: Bed rest >72 hours within 3 month, Older Age, Other (comment) (Illness in month prior to DVT symptom onset - sick with bronchitis/PNA >1 month in duration) Bleeding Risk Factors: Age >65 years, Anticoagulant therapy  Negative Thrombotic Risk Factors: Recent surgery (within 3 months), Recent trauma (within 3 months), Recent admission to hospital with acute illness (within 3 months), Paralysis, paresis, or recent plaster cast immobilization of lower extremity, Central venous catheterization, Pregnancy, Sedentary journey lasting >8 hours within 4 weeks, Within 6 weeks postpartum, Recent cesarean section (within 3 months),  Estrogen therapy, Testosterone therapy, Active cancer, Smoking, Obesity, Known thrombophilic condition, Recent COVID diagnosis (within 3 months), Erythropoiesis-stimulating agent, Non-malignant, chronic inflammatory condition  Rx Insurance Coverage: Medicare Rx Affordability: Xarelto is $47 for a 30 day supply.  Preferred Pharmacy: Refills sent to patient's preferred [facility].   Past Medical History:  Diagnosis Date   Arthritis    C. difficile colitis    Diabetes mellitus    Diverticulitis    Fibromyalgia    Hypercholesteremia    Hypertension    IBS (irritable bowel syndrome)    Migraines    Nasal fracture    Stein-Leventhal ovaries     Past Surgical History:  Procedure Laterality Date   CHOLECYSTECTOMY  1998   KNEE ARTHROSCOPY Right 2017   KNEE ARTHROSCOPY Left    LEFT HEART CATHETERIZATION WITH CORONARY ANGIOGRAM N/A 12/19/2011   Procedure: LEFT HEART CATHETERIZATION WITH CORONARY ANGIOGRAM;  Surgeon: Candee Furbish, MD;  Location: [facility] CATH LAB;  Service: Cardiovascular;  Laterality: N/A;   ROTATOR CUFF REPAIR Right    TONSILLECTOMY  1959    Social History   Socioeconomic History   Marital status: Single    Spouse name: Not on file   Number of children: 0   Years of education: Some college   Highest education level: Not on file  Occupational History   Occupation: accounting    Comment: [employer]  Tobacco Use   Smoking status: Never   Smokeless tobacco: Never  Vaping Use   Vaping Use: Never used  Substance and Sexual Activity   Alcohol use: Yes  Comment: rare-once a year at a celebration   Drug use: No   Sexual activity: Not on file  Other Topics Concern   Not on file  Social History Narrative   Lives alone w/ her 3 cats   Right-handed   Caffeine: 2 cups coffee/tea per day      Adoptive Dad was 1/2 bro to pt birth mother   Social Determinants of Health   Financial Resource Strain: Not on file  Food Insecurity: Not on file  Transportation Needs:  Not on file  Physical Activity: Not on file  Stress: Not on file  Social Connections: Not on file  Intimate Partner Violence: Not on file    Family History  Adopted: Yes  Problem Relation Age of Onset   Failure to thrive Mother    Heart failure Father    Neuropathy Neg Hx     Allergies as of 04/04/2022 - Review Complete 04/04/2022  Allergen Reaction Noted   Ceftriaxone Hives, Itching, and Other (See Comments) 08/04/2019   Dicyclomine Hives 04/02/2021   Erythromycin Anaphylaxis and Other (See Comments) 06/04/2016   Iodinated contrast media Hives, Other (See Comments), and Shortness Of Breath 05/12/2011   Amoxicillin-pot clavulanate Nausea And Vomiting 05/12/2011   Canagliflozin Rash and Other (See Comments) 06/04/2016   Statins Other (See Comments) 07/07/2015   Sulfa antibiotics Other (See Comments) 09/09/2013   Cipro [ciprofloxacin hcl] Other (See Comments) 05/12/2020   Corticosteroids Other (See Comments) 05/12/2020   Insulin glargine Other (See Comments) 05/12/2017   Metformin and related Diarrhea and Nausea And Vomiting 06/04/2016   Metformin hcl Other (See Comments) 05/12/2020   Scallops [shellfish allergy] Diarrhea and Nausea And Vomiting 08/02/2019   Tramadol hcl Other (See Comments) 05/12/2020   Nsaids Other (See Comments) 07/07/2015   Tramadol Rash 07/07/2017    Current Outpatient Medications on File Prior to Encounter  Medication Sig Dispense Refill   acetaminophen (TYLENOL) 650 MG CR tablet 2 tablets as needed     acetaminophen-codeine (TYLENOL #3) 300-30 MG tablet Take 1 tablet by mouth every 8 (eight) hours as needed for moderate pain. 60 tablet 0   BERBERINE CHLORIDE PO Take 1 capsule by mouth 2 (two) times daily.     cholecalciferol (VITAMIN D) 1000 UNITS tablet Take 1,000 Units by mouth every evening.      CHROMIUM PO Take 1 tablet by mouth daily.     hydrochlorothiazide (HYDRODIURIL) 50 MG tablet Take 1/2 (one-half) tablet by mouth twice daily 90 tablet 2    HYDROcodone-acetaminophen (NORCO) 7.5-325 MG tablet Take 1 tablet by mouth every 6 (six) hours as needed for moderate pain. 20 tablet 0   insulin NPH Human (NOVOLIN N) 100 UNIT/ML injection Inject 0.3 mLs (30 Units total) into the skin 2 (two) times daily before a meal. (Patient taking differently: Inject 40 Units into the skin 2 (two) times daily before a meal.) 20 mL 0   insulin regular (NOVOLIN R) 100 units/mL injection Inject into the skin. 25 units before breakfast and dinner, 15-20 units before lunch     Magnesium 400 MG CAPS Take 400 mg by mouth daily.     metoprolol tartrate (LOPRESSOR) 50 MG tablet Take 0.5 tablets (25 mg total) by mouth 2 (two) times daily. 90 tablet 3   potassium chloride (KLOR-CON) 8 MEQ tablet Take 8 mEq by mouth 2 (two) times daily.     rivaroxaban (XARELTO) 20 MG TABS tablet Take 1 tablet (20 mg total) by mouth daily with supper. Take with food.  Start taking after completion of starter pack. 30 tablet 1   tiZANidine (ZANAFLEX) 4 MG tablet Take 1 tablet (4 mg total) by mouth every 6 (six) hours as needed for muscle spasms. 30 tablet 3   ACCU-CHEK SMARTVIEW test strip Use 3x a day 300 each 4   albuterol (VENTOLIN HFA) 108 (90 Base) MCG/ACT inhaler Inhale 2 puffs into the lungs every 6 (six) hours as needed for wheezing or shortness of breath. (Patient not taking: Reported on 04/04/2022) 8 g 0   Lancets (ACCU-CHEK MULTICLIX) lancets Use as instructed to test 3x daily DX E11.65 300 each 4   PRESCRIPTION MEDICATION Patient receives a Steroid injection in the neck once a year with Dr. Mina Marble at [facility] pt is unable to recall the exact name and i have been unable to verify name from the Dr's office. Patient also states " it shoots up my blood sugars really high, but i still get it because i can't take anything else for my neck pain"     [DISCONTINUED] gabapentin (NEURONTIN) 100 MG capsule Take 1 capsule (100 mg total) by mouth 3 (three) times daily. (Patient not taking:  Reported on 07/06/2019) 90 capsule 12   No current facility-administered medications on file prior to encounter.   REVIEW OF SYSTEMS:  Review of Systems  Respiratory:  Negative for shortness of breath.   Cardiovascular:  Negative for chest pain, palpitations and leg swelling.  Musculoskeletal:  Positive for back pain and myalgias.  Neurological:  Negative for dizziness.   PHYSICAL EXAMINATION:  Vitals:   04/04/22 0909  BP: (!) 147/72  Pulse: 76  SpO2: 99%   Physical Exam Vitals reviewed.  Cardiovascular:     Rate and Rhythm: Normal rate.  Pulmonary:     Effort: Pulmonary effort is normal.  Musculoskeletal:        General: No tenderness.     Right lower leg: Edema (trace) present.     Left lower leg: No edema.  Skin:    Findings: No erythema.  Psychiatric:        Mood and Affect: Mood normal.        Behavior: Behavior normal.        Thought Content: Thought content normal.   Villalta Score for Post-Thrombotic Syndrome: Pain: Mild Cramps: Mild Heaviness: Mild Paresthesia: Absent Pruritus: Mild Pretibial Edema: Mild Skin Induration: Absent Hyperpigmentation: Absent Redness: Absent Venous Ectasia: Mild Pain on calf compression: Absent Villalta Preliminary Score: 6 Is venous ulcer present?: No If venous ulcer is present and score is <15, then 15 points total are assigned: Absent Villalta Total Score: 6  LABS:  CBC     Component Value Date/Time   WBC 8.2 02/20/2022 0837   RBC 4.44 02/20/2022 0837   HGB 14.0 02/20/2022 0837   HGB 13.6 01/06/2017 1146   HCT 40.5 02/20/2022 0837   HCT 41.3 01/06/2017 1146   PLT 273.0 02/20/2022 0837   PLT 232 01/06/2017 1146   MCV 91.1 02/20/2022 0837   MCV 91 01/06/2017 1146   MCH 30.2 03/17/2021 1521   MCHC 34.5 02/20/2022 0837   RDW 13.9 02/20/2022 0837   RDW 14.3 01/06/2017 1146   LYMPHSABS 2.4 02/20/2022 0837   MONOABS 0.8 02/20/2022 0837   EOSABS 0.2 02/20/2022 0837   BASOSABS 0.1 02/20/2022 0837    Hepatic  Function      Component Value Date/Time   PROT 6.8 02/20/2022 0837   PROT 6.6 01/06/2017 1146   ALBUMIN 3.9 02/20/2022 0837  ALBUMIN 4.0 01/06/2017 1146   AST 12 02/20/2022 0837   ALT 9 02/20/2022 0837   ALKPHOS 78 02/20/2022 0837   BILITOT 0.8 02/20/2022 0837   BILITOT 0.4 01/06/2017 1146    Renal Function   Lab Results  Component Value Date   CREATININE 0.79 02/20/2022   CREATININE 0.85 04/02/2021   CREATININE 0.98 03/17/2021    CrCl cannot be calculated (Patient's most recent lab result is older than the maximum 21 days allowed.).   VVS Vascular Lab Studies:  03/05/22 VAS [place] LOWER EXTREMITY VENOUS LEFT (DVT) Summary:  RIGHT:  - Findings consistent with acute deep vein thrombosis involving the right  femoral vein, right popliteal vein, right peroneal veins, and right  posterior tibial veins.  - No cystic structure found in the popliteal fossa.   LEFT:  - No evidence of common femoral vein obstruction.   ASSESSMENT: Location of DVT: Right femoral vein, Right popliteal vein, Right distal vein Cause of DVT: provoked by a transient risk factor  Patient's symptoms have significantly improved after 1 month of adherence to Xarelto and wearing compression stockings. Plan is to continue anticoagulation to complete 3 months for provoked DVT.    PLAN: -Continue rivaroxaban (Xarelto) 20 mg daily with food. -Expected duration of therapy: 3 months. Therapy started on 03/04/22. -Patient educated on purpose, proper use and potential adverse effects of rivaroxaban (Xarelto). -Discussed importance of taking medication around the same time every day. -Advised patient of medications to avoid (NSAIDs, aspirin doses >100 mg daily). -Educated that Tylenol (acetaminophen) is the preferred analgesic to lower the risk of bleeding. -Advised patient to alert all providers of anticoagulation therapy prior to starting a new medication or having a procedure. -Emphasized importance of monitoring for  signs and symptoms of bleeding (abnormal bruising, prolonged bleeding, nose bleeds, bleeding from gums, discolored urine, black tarry stools). -Educated patient to present to the ED if emergent signs and symptoms of new thrombosis occur. -Counseled patient to wear compression stockings daily, removing at night.  Follow up: 2 months in DVT Clinic for final visit.  Rebbeca Paul, PharmD, [place], CPP Deep Vein Thrombosis Clinic Clinical Pharmacist Practitioner Office: [phone]

## 2022-05-07 DIAGNOSIS — E119 Type 2 diabetes mellitus without complications: Secondary | ICD-10-CM | POA: Diagnosis not present

## 2022-05-07 DIAGNOSIS — H5711 Ocular pain, right eye: Secondary | ICD-10-CM | POA: Diagnosis not present

## 2022-05-07 DIAGNOSIS — H2513 Age-related nuclear cataract, bilateral: Secondary | ICD-10-CM | POA: Diagnosis not present

## 2022-05-07 DIAGNOSIS — H04123 Dry eye syndrome of bilateral lacrimal glands: Secondary | ICD-10-CM | POA: Diagnosis not present

## 2022-05-07 LAB — HM DIABETES EYE EXAM

## 2022-05-30 ENCOUNTER — Telehealth: Payer: Self-pay | Admitting: Family Medicine

## 2022-05-30 NOTE — Telephone Encounter (Signed)
Contacted [name] to schedule their annual wellness visit. Patient declined to schedule AWV at this time.  Patient declined.. does not want to participate.  Gabriel Cirri [facility] AWV TEAM Direct Dial [phone]

## 2022-06-03 ENCOUNTER — Encounter: Payer: Self-pay | Admitting: Family Medicine

## 2022-06-03 ENCOUNTER — Telehealth: Payer: Self-pay

## 2022-06-03 ENCOUNTER — Encounter (HOSPITAL_COMMUNITY): Payer: Self-pay

## 2022-06-03 ENCOUNTER — Ambulatory Visit (INDEPENDENT_AMBULATORY_CARE_PROVIDER_SITE_OTHER): Payer: Medicare HMO | Admitting: Family Medicine

## 2022-06-03 ENCOUNTER — Ambulatory Visit (HOSPITAL_COMMUNITY)
Admission: RE | Admit: 2022-06-03 | Discharge: 2022-06-03 | Disposition: A | Discharge status: Home / Self Care | Payer: Medicare HMO | Source: Ambulatory Visit | Attending: Vascular Surgery | Admitting: Vascular Surgery

## 2022-06-03 VITALS — BP 122/70 | HR 75 | Temp 97.9°F | Resp 16 | Ht 65.0 in

## 2022-06-03 VITALS — BP 122/70 | HR 74

## 2022-06-03 DIAGNOSIS — E7800 Pure hypercholesterolemia, unspecified: Secondary | ICD-10-CM

## 2022-06-03 DIAGNOSIS — G720 Drug-induced myopathy: Secondary | ICD-10-CM | POA: Diagnosis not present

## 2022-06-03 DIAGNOSIS — I82411 Acute embolism and thrombosis of right femoral vein: Secondary | ICD-10-CM | POA: Diagnosis not present

## 2022-06-03 DIAGNOSIS — I10 Essential (primary) hypertension: Secondary | ICD-10-CM

## 2022-06-03 DIAGNOSIS — E1165 Type 2 diabetes mellitus with hyperglycemia: Secondary | ICD-10-CM | POA: Diagnosis not present

## 2022-06-03 DIAGNOSIS — Z794 Long term (current) use of insulin: Secondary | ICD-10-CM | POA: Diagnosis not present

## 2022-06-03 NOTE — Assessment & Plan Note (Signed)
Chronic.  Uncontrolled.  Intolerance to medications, cost issues.  Not taking regular insulin regularly.  Timing issues, may not eat supper, lunch late and can't take insulin then wait to eat.   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  Won't go back to endo.  Will refer to pharmacy D for assistance as well.  Patient will check insurance on continuous glucose monitor and glucometer/lancets preference.

## 2022-06-03 NOTE — Progress Notes (Signed)
Subjective:     Patient ID: [name], female    DOB: 04-20-1950, 72 y.o.   MRN: [id number]  Chief Complaint  Patient presents with   Medical Management of Chronic Issues    3 month follow-up on dm and HTN Not fasting      HPI  HYPERTENSION-Pt is on lopressor 25 mg twice daily hydrochloroTHIAZIDE 25mg   .  Bp's running not checking.  No ha/dizziness/cp/palp/cough/shortness of breath.  Edema better w/compression stockings  DM type 2-NPH 40 twice daily. Right 25 qac but forgets lunch frequently.  Intolerant statins.  Not going back to endocrine.  Ophth this year..not checking sugars.  Mornings are ok.  Rest of day is off.  Sometimes not taking the right at lunch and supper. Never on ACE/ARB   having issues w/costs of medications.   Lumbar pain-will get injections once off Xarelto.  1 month-"crinkles" in left>right ear.  No drainage.  Notices more when lying back.   Health Maintenance Due  Topic Date Due   Medicare Annual Wellness (AWV)  Never done   DTaP/Tdap/Td (2 - Td or Tdap) 07/27/2019    Past Medical History:  Diagnosis Date   Arthritis    C. difficile colitis    Diabetes mellitus    Diverticulitis    Fibromyalgia    Hypercholesteremia    Hypertension    IBS (irritable bowel syndrome)    Migraines    Nasal fracture    Stein-Leventhal ovaries     Past Surgical History:  Procedure Laterality Date   CHOLECYSTECTOMY  1998   KNEE ARTHROSCOPY Right 2017   KNEE ARTHROSCOPY Left    LEFT HEART CATHETERIZATION WITH CORONARY ANGIOGRAM N/A 12/19/2011   Procedure: LEFT HEART CATHETERIZATION WITH CORONARY ANGIOGRAM;  Surgeon: Donato Schultz, MD;  Location: [facility] CATH LAB;  Service: Cardiovascular;  Laterality: N/A;   ROTATOR CUFF REPAIR Right    TONSILLECTOMY  1959     Current Outpatient Medications:    ACCU-CHEK SMARTVIEW test strip, Use 3x a day, Disp: 300 each, Rfl: 4   acetaminophen (TYLENOL) 650 MG CR tablet, 2 tablets as needed, Disp: , Rfl:     acetaminophen-codeine (TYLENOL #3) 300-30 MG tablet, Take 1 tablet by mouth every 8 (eight) hours as needed for moderate pain., Disp: 60 tablet, Rfl: 0   albuterol (VENTOLIN HFA) 108 (90 Base) MCG/ACT inhaler, Inhale 2 puffs into the lungs every 6 (six) hours as needed for wheezing or shortness of breath., Disp: 8 g, Rfl: 0   BERBERINE CHLORIDE PO, Take 1 capsule by mouth 2 (two) times daily., Disp: , Rfl:    cholecalciferol (VITAMIN D) 1000 UNITS tablet, Take 1,000 Units by mouth every evening. , Disp: , Rfl:    CHROMIUM PO, Take 1 tablet by mouth daily., Disp: , Rfl:    hydrochlorothiazide (HYDRODIURIL) 50 MG tablet, Take 1/2 (one-half) tablet by mouth twice daily, Disp: 90 tablet, Rfl: 2   HYDROcodone-acetaminophen (NORCO) 7.5-325 MG tablet, Take 1 tablet by mouth every 6 (six) hours as needed for moderate pain., Disp: 20 tablet, Rfl: 0   insulin NPH Human (NOVOLIN N) 100 UNIT/ML injection, Inject 0.3 mLs (30 Units total) into the skin 2 (two) times daily before a meal. (Patient taking differently: Inject 40 Units into the skin 2 (two) times daily before a meal.), Disp: 20 mL, Rfl: 0   insulin regular (NOVOLIN R) 100 units/mL injection, Inject into the skin. 25 units before breakfast and dinner, 15-20 units before lunch, Disp: , Rfl:  Lancets (ACCU-CHEK MULTICLIX) lancets, Use as instructed to test 3x daily DX E11.65, Disp: 300 each, Rfl: 4   Magnesium 400 MG CAPS, Take 400 mg by mouth daily., Disp: , Rfl:    metoprolol tartrate (LOPRESSOR) 50 MG tablet, Take 0.5 tablets (25 mg total) by mouth 2 (two) times daily., Disp: 90 tablet, Rfl: 3   potassium chloride (KLOR-CON) 8 MEQ tablet, Take 8 mEq by mouth 2 (two) times daily., Disp: , Rfl:    PRESCRIPTION MEDICATION, Patient receives a Steroid injection in the neck once a year with Dr. Regino Schultze at [facility] pt is unable to recall the exact name and i have been unable to verify name from the Dr's office. Patient also states " it shoots up my  blood sugars really high, but i still get it because i can't take anything else for my neck pain", Disp: , Rfl:    tiZANidine (ZANAFLEX) 4 MG tablet, Take 1 tablet (4 mg total) by mouth every 6 (six) hours as needed for muscle spasms., Disp: 30 tablet, Rfl: 3  Allergies  Allergen Reactions   Ceftriaxone Hives, Itching and Other (See Comments)   Dicyclomine Hives    Hives, itching, swelling, vomiting, felt like something was sitting on chest   Erythromycin Anaphylaxis and Other (See Comments)   Iodinated Contrast Media Hives, Other (See Comments) and Shortness Of Breath   Amoxicillin-Pot Clavulanate Nausea And Vomiting    Nausea, vomiting, headache, dizziness. Hives on 08/2019 Other reaction(s): hives, Other Nausea, vomiting, headache, dizziness   pt states she can take other Penicillins just not augmentin Nausea, vomiting, headache, dizziness. Hives on 08/2019    Canagliflozin Rash and Other (See Comments)    Dehydration Dehydration Dehydration    Statins Other (See Comments)    Severe muscle pain/weakness Muscle pain/ weekness    Sulfa Antibiotics Other (See Comments)    blackout PER PT SHE BLACKS OUT    Cipro [Ciprofloxacin Hcl] Other (See Comments)   Corticosteroids Other (See Comments)   Insulin Glargine Other (See Comments)    Severe ankle swelling   Metformin And Related Diarrhea and Nausea And Vomiting    Violently ill for 2 yrs   Metformin Hcl Other (See Comments)   Scallops [Shellfish Allergy] Diarrhea and Nausea And Vomiting    Severe GI upset   Tramadol Hcl Other (See Comments)   Nsaids Other (See Comments)    Stomach problems Stomach problems    Tramadol Rash   ROS neg/noncontributory except as noted HPI/below      Objective:     BP 122/70   Pulse 75   Temp 97.9 F (36.6 C) (Temporal)   Resp 16   Ht 5\' 5"  (1.651 m)   SpO2 99%   BMI 31.28 kg/m  Wt Readings from Last 3 Encounters:  08/08/21 188 lb (85.3 kg)  03/17/21 188 lb (85.3 kg)   03/06/21 189 lb (85.7 kg)    Physical Exam   Gen: WDWN NAD HEENT: NCAT, conjunctiva not injected, sclera nonicteric Ears: tympanic membrane WNL B.  No wax NECK:  supple, no thyromegaly, no nodes, no carotid bruits CARDIAC: RRR, S1S2+, no murmur. DP 2+B LUNGS: CTAB. No wheezes ABDOMEN:  BS+, soft, NTND, No HSM, no masses EXT:  1+ edema right>L MSK: cane NEURO: A&O x3.  CN II-XII intact.  PSYCH: normal mood. Good eye contact     Assessment & Plan:  Essential hypertension Assessment & Plan: Chronic.  Well controlled.  Continue hydrochloroTHIAZIDE 25 mg daily and metoprolol  25 mg twice daily.  Taking OTC (available over the counter without a prescription) K.     Type 2 diabetes mellitus with hyperglycemia, with long-term current use of insulin (HCC) Assessment & Plan: Chronic.  Uncontrolled.  Intolerance to medications, cost issues.  Not taking regular insulin regularly.  Timing issues, may not eat supper, lunch late and can't take insulin then wait to eat.   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  Won't go back to endo.  Will refer to pharmacy D for assistance as well.  Patient will check insurance on continuous glucose monitor and glucometer/lancets preference.    Orders: -     AMB Referral to Pharmacy Medication Management  Pure hypercholesterolemia Assessment & Plan: Chronic.  Uncontrolled.  Intolerant statins.  Declines zetia for now.   Drug-induced myopathy  Long-term insulin use (HCC) -     AMB Referral to Pharmacy Medication Management  Follow up 43mo w/labs  Angelena Sole, MD

## 2022-06-03 NOTE — Assessment & Plan Note (Signed)
Chronic.  Well controlled.  Continue hydrochloroTHIAZIDE 25 mg daily and metoprolol 25 mg twice daily.  Taking OTC (available over the counter without a prescription) K.

## 2022-06-03 NOTE — Patient Instructions (Signed)
It was very nice to see you today!  Flonase for 1 month.    PLEASE NOTE:  If you had any lab tests please let us know if you have not heard back within a few days. You may see your results on MyChart before we have a chance to review them but we will give you a call once they are reviewed by [place]. If we ordered any referrals today, please let us know if you have not heard from their office within the next week.   Please try these tips to maintain a healthy lifestyle:  Eat most of your calories during the day when you are active. Eliminate processed foods including packaged sweets (pies, cakes, cookies), reduce intake of potatoes, white bread, white pasta, and white rice. Look for whole grain options, oat flour or almond flour.  Each meal should contain half fruits/vegetables, one quarter protein, and one quarter carbs (no bigger than a computer mouse).  Cut down on sweet beverages. This includes juice, soda, and sweet tea. Also watch fruit intake, though this is a healthier sweet option, it still contains natural sugar! Limit to 3 servings daily.  Drink at least 1 glass of water with each meal and aim for at least 8 glasses per day  Exercise at least 150 minutes every week.

## 2022-06-03 NOTE — Patient Instructions (Signed)
You have been discharged from the DVT Clinic! No further follow up in the DVT Clinic is needed.  -Discontinue Xarelto -Go to the emergency room if emergent signs and symptoms of new clot occur (new or worse swelling and pain in an arm or leg, shortness of breath, chest pain, fast or irregular heartbeats, lightheadedness, dizziness, fainting, coughing up blood) or if you experience a significant color change (pale or blue) in the extremity that has the DVT.  -If you are having an emergency, call 911 or present to the nearest emergency room.   Please reach out if any questions come up. [phone] [facility].

## 2022-06-03 NOTE — Progress Notes (Signed)
Pt states that she will call back after she speaks with her insurance

## 2022-06-03 NOTE — Assessment & Plan Note (Signed)
Chronic.  Uncontrolled.  Intolerant statins.  Declines zetia for now.

## 2022-06-03 NOTE — Progress Notes (Addendum)
DVT Clinic Note  Name: [name]     MRN: [id number]     DOB: July 07, 1950     Sex: female  PCP: Jeani Sow, MD  Today's Visit: Visit Information: Discharge Visit  Referred to DVT Clinic by: Dr. Ruthine Dose (PCP)   Referred to CPP by: Dr. Randie Heinz Reason for referral:  Chief Complaint  Patient presents with   Med Management - DVT   HISTORY OF PRESENT ILLNESS:  [name] is a 72 y.o. female with PMH HTN, T2DM, HLD, fibromyalgia who presents for follow up medication management after diagnosis of DVT on involving R distal femoral, popliteal, peroneal, and posterior tibial veins on 03/05/22. DVT thought to be provoked by >1 month illness with bronchitis/PNA and ~5 day bed rest leading up to onset of DVT symptoms. Xarelto was started with plans to treat for 3 months. Last seen in DVT Clinic 04/04/22 at which time symptoms were improving and Xarelto was continued.   Today patient reports her RLE pain and swelling has resolved. She has been unable to get injections in her back for chronic pain since being on anticoagulation, so she has experienced pain related to that. Denies abnormal bleeding or bruising. Endorses 1-2 missed doses of Xarelto in the past few months. Endorses wearing compression stockings and reports this contributed to the significant improvement in her swelling. She works a Health and safety inspector job and tries to get up to walk around her house every hour. She is working on increasing her activity as well which she thinks will improve once she is able to resume her lumbar injections. Her final dose of Xarelto was last night.    Positive Thrombotic Risk Factors: Bed rest >72 hours within 3 month, Older Age, Other (comment) (Illness in month prior to DVT symptom onset - sick with bronchitis/PNA >1 month in duration) Bleeding Risk Factors: Age >65 years, Anticoagulant therapy  Negative Thrombotic Risk Factors: Previous VTE, Recent surgery (within 3 months), Recent trauma (within 3 months), Recent  admission to hospital with acute illness (within 3 months), Paralysis, paresis, or recent plaster cast immobilization of lower extremity, Central venous catheterization, Pregnancy, Sedentary journey lasting >8 hours within 4 weeks, Within 6 weeks postpartum, Recent cesarean section (within 3 months), Estrogen therapy, Testosterone therapy, Active cancer, Smoking, Obesity, Known thrombophilic condition, Recent COVID diagnosis (within 3 months), Erythropoiesis-stimulating agent, Non-malignant, chronic inflammatory condition  Rx Insurance Coverage: Medicare Rx Affordability: Xarelto is $47 for a 30 day supply.   Past Medical History:  Diagnosis Date   Arthritis    C. difficile colitis    Diabetes mellitus    Diverticulitis    Fibromyalgia    Hypercholesteremia    Hypertension    IBS (irritable bowel syndrome)    Migraines    Nasal fracture    Stein-Leventhal ovaries     Past Surgical History:  Procedure Laterality Date   CHOLECYSTECTOMY  1998   KNEE ARTHROSCOPY Right 2017   KNEE ARTHROSCOPY Left    LEFT HEART CATHETERIZATION WITH CORONARY ANGIOGRAM N/A 12/19/2011   Procedure: LEFT HEART CATHETERIZATION WITH CORONARY ANGIOGRAM;  Surgeon: Donato Schultz, MD;  Location: [facility] CATH LAB;  Service: Cardiovascular;  Laterality: N/A;   ROTATOR CUFF REPAIR Right    TONSILLECTOMY  1959    Social History   Socioeconomic History   Marital status: Single    Spouse name: Not on file   Number of children: 0   Years of education: Some college   Highest education level: Some college, no degree  Occupational History   Occupation: accounting    Comment: [employer]  Tobacco Use   Smoking status: Never   Smokeless tobacco: Never  Vaping Use   Vaping Use: Never used  Substance and Sexual Activity   Alcohol use: Yes    Comment: rare-once a year at a celebration   Drug use: No   Sexual activity: Not on file  Other Topics Concern   Not on file  Social History Narrative   Lives alone w/ her  3 cats   Right-handed   Caffeine: 2 cups coffee/tea per day      Adoptive Dad was 1/2 bro to pt birth mother   Social Determinants of Health   Financial Resource Strain: Low Risk  (05/30/2022)   Overall Financial Resource Strain (CARDIA)    Difficulty of Paying Living Expenses: Not very hard  Food Insecurity: No Food Insecurity (05/30/2022)   Hunger Vital Sign    Worried About Running Out of Food in the Last Year: Never true    Ran Out of Food in the Last Year: Never true  Transportation Needs: No Transportation Needs (05/30/2022)   PRAPARE - Administrator, Civil Service (Medical): No    Lack of Transportation (Non-Medical): No  Physical Activity: Unknown (05/30/2022)   Exercise Vital Sign    Days of Exercise per Week: 0 days    Minutes of Exercise per Session: Not on file  Stress: No Stress Concern Present (05/30/2022)   [employer] of Occupational Health - Occupational Stress Questionnaire    Feeling of Stress : Not at all  Social Connections: Moderately Integrated (05/30/2022)   Social Connection and Isolation Panel [NHANES]    Frequency of Communication with Friends and Family: More than three times a week    Frequency of Social Gatherings with Friends and Family: Patient declined    Attends Religious Services: More than 4 times per year    Active Member of Clubs or Organizations: Yes    Attends Engineer, structural: More than 4 times per year    Marital Status: Never married  Intimate Partner Violence: Not on file    Family History  Adopted: Yes  Problem Relation Age of Onset   Failure to thrive Mother    Heart failure Father    Neuropathy Neg Hx     Allergies as of 06/03/2022 - Review Complete 06/03/2022  Allergen Reaction Noted   Ceftriaxone Hives, Itching, and Other (See Comments) 08/04/2019   Dicyclomine Hives 04/02/2021   Erythromycin Anaphylaxis and Other (See Comments) 06/04/2016   Iodinated contrast media Hives, Other (See Comments),  and Shortness Of Breath 05/12/2011   Amoxicillin-pot clavulanate Nausea And Vomiting 05/12/2011   Canagliflozin Rash and Other (See Comments) 06/04/2016   Statins Other (See Comments) 07/07/2015   Sulfa antibiotics Other (See Comments) 09/09/2013   Cipro [ciprofloxacin hcl] Other (See Comments) 05/12/2020   Corticosteroids Other (See Comments) 05/12/2020   Insulin glargine Other (See Comments) 05/12/2017   Metformin and related Diarrhea and Nausea And Vomiting 06/04/2016   Metformin hcl Other (See Comments) 05/12/2020   Scallops [shellfish allergy] Diarrhea and Nausea And Vomiting 08/02/2019   Tramadol hcl Other (See Comments) 05/12/2020   Nsaids Other (See Comments) 07/07/2015   Tramadol Rash 07/07/2017    Current Outpatient Medications on File Prior to Encounter  Medication Sig Dispense Refill   ACCU-CHEK SMARTVIEW test strip Use 3x a day 300 each 4   acetaminophen (TYLENOL) 650 MG CR tablet 2 tablets as needed  acetaminophen-codeine (TYLENOL #3) 300-30 MG tablet Take 1 tablet by mouth every 8 (eight) hours as needed for moderate pain. 60 tablet 0   albuterol (VENTOLIN HFA) 108 (90 Base) MCG/ACT inhaler Inhale 2 puffs into the lungs every 6 (six) hours as needed for wheezing or shortness of breath. 8 g 0   BERBERINE CHLORIDE PO Take 1 capsule by mouth 2 (two) times daily.     cholecalciferol (VITAMIN D) 1000 UNITS tablet Take 1,000 Units by mouth every evening.      CHROMIUM PO Take 1 tablet by mouth daily.     hydrochlorothiazide (HYDRODIURIL) 50 MG tablet Take 1/2 (one-half) tablet by mouth twice daily 90 tablet 2   HYDROcodone-acetaminophen (NORCO) 7.5-325 MG tablet Take 1 tablet by mouth every 6 (six) hours as needed for moderate pain. 20 tablet 0   insulin NPH Human (NOVOLIN N) 100 UNIT/ML injection Inject 0.3 mLs (30 Units total) into the skin 2 (two) times daily before a meal. (Patient taking differently: Inject 40 Units into the skin 2 (two) times daily before a meal.) 20 mL 0    insulin regular (NOVOLIN R) 100 units/mL injection Inject into the skin. 25 units before breakfast and dinner, 15-20 units before lunch     Lancets (ACCU-CHEK MULTICLIX) lancets Use as instructed to test 3x daily DX E11.65 300 each 4   Magnesium 400 MG CAPS Take 400 mg by mouth daily.     metoprolol tartrate (LOPRESSOR) 50 MG tablet Take 0.5 tablets (25 mg total) by mouth 2 (two) times daily. 90 tablet 3   potassium chloride (KLOR-CON) 8 MEQ tablet Take 8 mEq by mouth 2 (two) times daily.     PRESCRIPTION MEDICATION Patient receives a Steroid injection in the neck once a year with Dr. Regino Schultze at [facility] pt is unable to recall the exact name and i have been unable to verify name from the Dr's office. Patient also states " it shoots up my blood sugars really high, but i still get it because i can't take anything else for my neck pain"     tiZANidine (ZANAFLEX) 4 MG tablet Take 1 tablet (4 mg total) by mouth every 6 (six) hours as needed for muscle spasms. 30 tablet 3   [DISCONTINUED] gabapentin (NEURONTIN) 100 MG capsule Take 1 capsule (100 mg total) by mouth 3 (three) times daily. (Patient not taking: Reported on 07/06/2019) 90 capsule 12   No current facility-administered medications on file prior to encounter.   REVIEW OF SYSTEMS:  Review of Systems  Respiratory:  Negative for shortness of breath.   Cardiovascular:  Negative for chest pain, palpitations and leg swelling.  Musculoskeletal:  Negative for myalgias.  Neurological:  Negative for dizziness and tingling.   PHYSICAL EXAMINATION:  Vitals:   06/03/22 1009  BP: 122/70  Pulse: 74  SpO2: 100%   Physical Exam Vitals reviewed.  Cardiovascular:     Rate and Rhythm: Normal rate.  Pulmonary:     Effort: Pulmonary effort is normal.  Musculoskeletal:        General: No tenderness.     Right lower leg: No edema.     Left lower leg: No edema.  Skin:    Findings: No erythema.  Psychiatric:        Mood and Affect: Mood  normal.        Behavior: Behavior normal.        Thought Content: Thought content normal.   Villalta Score for Post-Thrombotic Syndrome: Pain: Absent Cramps: Absent  Heaviness: Absent Paresthesia: Absent Pruritus: Absent Pretibial Edema: Absent Skin Induration: Absent Hyperpigmentation: Absent Redness: Absent Venous Ectasia: Mild Pain on calf compression: Absent Villalta Preliminary Score: 1 Is venous ulcer present?: No If venous ulcer is present and score is <15, then 15 points total are assigned: Absent Villalta Total Score: 1  LABS:  CBC     Component Value Date/Time   WBC 8.2 02/20/2022 0837   RBC 4.44 02/20/2022 0837   HGB 14.0 02/20/2022 0837   HGB 13.6 01/06/2017 1146   HCT 40.5 02/20/2022 0837   HCT 41.3 01/06/2017 1146   PLT 273.0 02/20/2022 0837   PLT 232 01/06/2017 1146   MCV 91.1 02/20/2022 0837   MCV 91 01/06/2017 1146   MCH 30.2 03/17/2021 1521   MCHC 34.5 02/20/2022 0837   RDW 13.9 02/20/2022 0837   RDW 14.3 01/06/2017 1146   LYMPHSABS 2.4 02/20/2022 0837   MONOABS 0.8 02/20/2022 0837   EOSABS 0.2 02/20/2022 0837   BASOSABS 0.1 02/20/2022 0837    Hepatic Function      Component Value Date/Time   PROT 6.8 02/20/2022 0837   PROT 6.6 01/06/2017 1146   ALBUMIN 3.9 02/20/2022 0837   ALBUMIN 4.0 01/06/2017 1146   AST 12 02/20/2022 0837   ALT 9 02/20/2022 0837   ALKPHOS 78 02/20/2022 0837   BILITOT 0.8 02/20/2022 0837   BILITOT 0.4 01/06/2017 1146    Renal Function   Lab Results  Component Value Date   CREATININE 0.79 02/20/2022   CREATININE 0.85 04/02/2021   CREATININE 0.98 03/17/2021    CrCl cannot be calculated (Patient's most recent lab result is older than the maximum 21 days allowed.).   VVS Vascular Lab Studies:  Summary:  RIGHT:  - Findings consistent with acute deep vein thrombosis involving the right  femoral vein, right popliteal vein, right peroneal veins, and right  posterior tibial veins.  - No cystic structure found in the  popliteal fossa.    LEFT:  - No evidence of common femoral vein obstruction.   ASSESSMENT: Location of DVT: Right femoral vein, Right popliteal vein, Right distal vein Cause of DVT: provoked by a transient risk factor  Patient has completed 3 months of anticoagulation with Xarelto for provoked DVT. Symptoms including RLE pain and swelling have returned to baseline. Patient was adherent to Xarelto with no toxicities.   PLAN: -Patient is discharged from the DVT Clinic. -Discontinue anticoagulation with Xarelto, as patient has completed 3 months of treatment for provoked DVT.  -Educated patient to present to the ED if emergent signs and symptoms of new thrombosis occur.  Follow up: with PCP. DVT Clinic as needed.   Pervis Hocking, PharmD, [place], CPP Deep Vein Thrombosis Clinic Clinical Pharmacist Practitioner Office: [phone]  I have evaluated the patient's chart/imaging and refer this patient to the Clinical Pharmacist Practitioner for medication management. I have reviewed the CPP's documentation and agree with her assessment and plan. I was immediately available during the visit for questions and collaboration.   Lemar Livings, MD

## 2022-06-03 NOTE — Addendum Note (Signed)
Encounter addended by: Maeola Harman, MD on: 06/03/2022 1:55 PM  Actions taken: Clinical Note Signed

## 2022-06-17 DIAGNOSIS — M47816 Spondylosis without myelopathy or radiculopathy, lumbar region: Secondary | ICD-10-CM | POA: Diagnosis not present

## 2022-06-18 NOTE — Progress Notes (Signed)
   Care Guide Note  06/18/2022 Name: [name] MRN: [id number] DOB: May 21, 1950  Referred by: Jeani Sow, MD Reason for referral : Care Coordination (Outreach to schedule with Pharm d )   [name] is a 72 y.o. year old female who is a primary care patient of Jeani Sow, MD. [name] was referred to the pharmacist for assistance related to DM.    Successful contact was made with the patient to discuss pharmacy services. Patient declines engagement at this time. Contact information was provided to the patient should they wish to reach out for assistance at a later time.  Penne Lash, RMA Care Guide [facility]  [place], [place] [address] Direct Dial: [phone] [name]

## 2022-06-20 ENCOUNTER — Ambulatory Visit: Payer: Medicare HMO | Admitting: Internal Medicine

## 2022-07-02 DIAGNOSIS — M47816 Spondylosis without myelopathy or radiculopathy, lumbar region: Secondary | ICD-10-CM | POA: Diagnosis not present

## 2022-07-10 ENCOUNTER — Telehealth: Payer: Self-pay | Admitting: Family Medicine

## 2022-07-10 ENCOUNTER — Other Ambulatory Visit: Payer: Self-pay | Admitting: Family Medicine

## 2022-07-10 MED ORDER — HYDROCODONE-ACETAMINOPHEN 7.5-325 MG PO TABS: 1.0000 | ORAL_TABLET | Freq: Four times a day (QID) | ORAL | 0 refills | Status: DC | PRN

## 2022-07-10 NOTE — Telephone Encounter (Signed)
Please see message below

## 2022-07-10 NOTE — Telephone Encounter (Signed)
PT HAS ONLY 1 TAB LEFT, NEEDS ASAP   Prescription Request  07/10/2022  LOV: 06/03/2022  What is the name of the medication or equipment?  HYDROcodone-acetaminophen (NORCO) 7.5-325 MG tablet   Have you contacted your pharmacy to request a refill? Yes   Which pharmacy would you like this sent to?  [facility] [address], [place] - [address] [place] [address] Phone: [phone] Fax: [phone]    Patient notified that their request is being sent to the clinical staff for review and that they should receive a response within 2 business days.   Please advise at Mobile [phone] (mobile)

## 2022-07-11 NOTE — Telephone Encounter (Signed)
Noted  

## 2022-07-22 ENCOUNTER — Encounter: Payer: Self-pay | Admitting: Family Medicine

## 2022-07-22 ENCOUNTER — Ambulatory Visit (INDEPENDENT_AMBULATORY_CARE_PROVIDER_SITE_OTHER)
Admission: RE | Admit: 2022-07-22 | Discharge: 2022-07-22 | Disposition: A | Discharge status: Home / Self Care | Payer: Medicare HMO | Source: Ambulatory Visit | Attending: Family Medicine | Admitting: Family Medicine

## 2022-07-22 ENCOUNTER — Ambulatory Visit (INDEPENDENT_AMBULATORY_CARE_PROVIDER_SITE_OTHER): Payer: Medicare HMO | Admitting: Family Medicine

## 2022-07-22 VITALS — BP 130/80 | HR 68 | Temp 97.5°F | Resp 18 | Ht 65.0 in

## 2022-07-22 DIAGNOSIS — M79671 Pain in right foot: Secondary | ICD-10-CM

## 2022-07-22 DIAGNOSIS — M7989 Other specified soft tissue disorders: Secondary | ICD-10-CM | POA: Diagnosis not present

## 2022-07-22 NOTE — Progress Notes (Signed)
X-ray negative. Do shoe as we discussed.  Also, take tylenol and since sensitive to NSAIDs, does she want to try meloxicam 15 mg daily(gentler on stomach) or just stick w/tylenol?  Also, ice it and elevate it

## 2022-07-22 NOTE — Progress Notes (Signed)
Subjective:     Patient ID: [name], female    DOB: 05-23-1950, 72 y.o.   MRN: [id number]  Chief Complaint  Patient presents with   Foot Injury    Right foot swollen, bruised and pain/achy, took a step in the grocery store on Saturday and sharp pain radiated up leg     HPI Right foot swollen and bruised and pain since 6/15.  Just stepped and pain shot from foot up to knee.  Now bruised.  Swelling resolves w/elevation.  No specific injury.  Hurts to bear weight.    Health Maintenance Due  Topic Date Due   Medicare Annual Wellness (AWV)  Never done   DTaP/Tdap/Td (2 - Td or Tdap) 07/27/2019   OPHTHALMOLOGY EXAM  06/24/2022    Past Medical History:  Diagnosis Date   Arthritis    C. difficile colitis    Diabetes mellitus    Diverticulitis    Fibromyalgia    Hypercholesteremia    Hypertension    IBS (irritable bowel syndrome)    Migraines    Nasal fracture    Stein-Leventhal ovaries     Past Surgical History:  Procedure Laterality Date   CHOLECYSTECTOMY  1998   KNEE ARTHROSCOPY Right 2017   KNEE ARTHROSCOPY Left    LEFT HEART CATHETERIZATION WITH CORONARY ANGIOGRAM N/A 12/19/2011   Procedure: LEFT HEART CATHETERIZATION WITH CORONARY ANGIOGRAM;  Surgeon: Donato Schultz, MD;  Location: [facility] CATH LAB;  Service: Cardiovascular;  Laterality: N/A;   ROTATOR CUFF REPAIR Right    TONSILLECTOMY  1959     Current Outpatient Medications:    ACCU-CHEK SMARTVIEW test strip, Use 3x a day, Disp: 300 each, Rfl: 4   acetaminophen (TYLENOL) 650 MG CR tablet, 2 tablets as needed, Disp: , Rfl:    acetaminophen-codeine (TYLENOL #3) 300-30 MG tablet, Take 1 tablet by mouth every 8 (eight) hours as needed for moderate pain., Disp: 60 tablet, Rfl: 0   albuterol (VENTOLIN HFA) 108 (90 Base) MCG/ACT inhaler, Inhale 2 puffs into the lungs every 6 (six) hours as needed for wheezing or shortness of breath., Disp: 8 g, Rfl: 0   BERBERINE CHLORIDE PO, Take 1 capsule by mouth 2 (two) times  daily., Disp: , Rfl:    cholecalciferol (VITAMIN D) 1000 UNITS tablet, Take 1,000 Units by mouth every evening. , Disp: , Rfl:    CHROMIUM PO, Take 1 tablet by mouth daily., Disp: , Rfl:    hydrochlorothiazide (HYDRODIURIL) 50 MG tablet, Take 1/2 (one-half) tablet by mouth twice daily, Disp: 90 tablet, Rfl: 2   HYDROcodone-acetaminophen (NORCO) 7.5-325 MG tablet, Take 1 tablet by mouth every 6 (six) hours as needed for moderate pain., Disp: 20 tablet, Rfl: 0   insulin NPH Human (NOVOLIN N) 100 UNIT/ML injection, Inject 0.3 mLs (30 Units total) into the skin 2 (two) times daily before a meal. (Patient taking differently: Inject 40 Units into the skin 2 (two) times daily before a meal.), Disp: 20 mL, Rfl: 0   insulin regular (NOVOLIN R) 100 units/mL injection, Inject into the skin. 25 units before breakfast and dinner, 15-20 units before lunch, Disp: , Rfl:    Lancets (ACCU-CHEK MULTICLIX) lancets, Use as instructed to test 3x daily DX E11.65, Disp: 300 each, Rfl: 4   Magnesium 400 MG CAPS, Take 400 mg by mouth daily., Disp: , Rfl:    metoprolol tartrate (LOPRESSOR) 50 MG tablet, Take 0.5 tablets (25 mg total) by mouth 2 (two) times daily., Disp: 90 tablet, Rfl:  3   potassium chloride (KLOR-CON) 8 MEQ tablet, Take 8 mEq by mouth 2 (two) times daily., Disp: , Rfl:    PRESCRIPTION MEDICATION, Patient receives a Steroid injection in the neck once a year with Dr. Regino Schultze at [facility] pt is unable to recall the exact name and i have been unable to verify name from the Dr's office. Patient also states " it shoots up my blood sugars really high, but i still get it because i can't take anything else for my neck pain", Disp: , Rfl:    RESVERATROL PO, Take by mouth., Disp: , Rfl:    tiZANidine (ZANAFLEX) 4 MG tablet, Take 1 tablet (4 mg total) by mouth every 6 (six) hours as needed for muscle spasms., Disp: 30 tablet, Rfl: 3  Allergies  Allergen Reactions   Ceftriaxone Hives, Itching and Other (See  Comments)   Dicyclomine Hives    Hives, itching, swelling, vomiting, felt like something was sitting on chest   Erythromycin Anaphylaxis and Other (See Comments)   Iodinated Contrast Media Hives, Other (See Comments) and Shortness Of Breath   Amoxicillin-Pot Clavulanate Nausea And Vomiting    Nausea, vomiting, headache, dizziness. Hives on 08/2019 Other reaction(s): hives, Other Nausea, vomiting, headache, dizziness   pt states she can take other Penicillins just not augmentin Nausea, vomiting, headache, dizziness. Hives on 08/2019    Canagliflozin Rash and Other (See Comments)    Dehydration Dehydration Dehydration    Statins Other (See Comments)    Severe muscle pain/weakness Muscle pain/ weekness    Sulfa Antibiotics Other (See Comments)    blackout PER PT SHE BLACKS OUT    Cipro [Ciprofloxacin Hcl] Other (See Comments)   Corticosteroids Other (See Comments)   Insulin Glargine Other (See Comments)    Severe ankle swelling   Metformin And Related Diarrhea and Nausea And Vomiting    Violently ill for 2 yrs   Metformin Hcl Other (See Comments)   Scallops [Shellfish Allergy] Diarrhea and Nausea And Vomiting    Severe GI upset   Tramadol Hcl Other (See Comments)   Nsaids Other (See Comments)    Stomach problems Stomach problems    Tramadol Rash   ROS neg/noncontributory except as noted HPI/below      Objective:     BP 130/80   Pulse 68   Temp (!) 97.5 F (36.4 C) (Temporal)   Resp 18   Ht 5\' 5"  (1.651 m)   SpO2 98%   BMI 31.28 kg/m  Wt Readings from Last 3 Encounters:  08/08/21 188 lb (85.3 kg)  03/17/21 188 lb (85.3 kg)  03/06/21 189 lb (85.7 kg)    Physical Exam   Gen: WDWN NAD HEENT: NCAT, conjunctiva not injected, sclera nonicteric EXT:  tr edema MSK: cane.  Right foot-ecchymoses 1st metatarsal area. To tenderness to palpation toes.  +swelling dorsum foot.  DP intact.  +tenderness to palpation first and second metatarsals on dorsum but not plantar  side.   NEURO: A&O x3.  CN II-XII intact.  PSYCH: normal mood. Good eye contact     Assessment & Plan:  Foot pain, right -     DG Foot Complete Right; Future  1.  Right foot pain-possible stress fracture, possible muscle strain.  She will see if [facility] has an opening today.  The phone kept ringing while she called them in the office.  Will go to [facility] for x-ray if she can get in with Ortho.  Advised to use an open shoe.  She does have walking boots at home.  Elevation.  Further plan dependent upon x-rays  No follow-ups on file.  Angelena Sole, MD

## 2022-07-30 DIAGNOSIS — M47816 Spondylosis without myelopathy or radiculopathy, lumbar region: Secondary | ICD-10-CM | POA: Diagnosis not present

## 2022-09-10 ENCOUNTER — Ambulatory Visit (INDEPENDENT_AMBULATORY_CARE_PROVIDER_SITE_OTHER): Payer: Medicare HMO | Admitting: Family Medicine

## 2022-09-10 ENCOUNTER — Encounter: Payer: Self-pay | Admitting: Family Medicine

## 2022-09-10 VITALS — BP 130/80 | HR 76 | Temp 97.9°F | Resp 18 | Ht 65.0 in

## 2022-09-10 DIAGNOSIS — Z794 Long term (current) use of insulin: Secondary | ICD-10-CM

## 2022-09-10 DIAGNOSIS — E7800 Pure hypercholesterolemia, unspecified: Secondary | ICD-10-CM

## 2022-09-10 DIAGNOSIS — E1165 Type 2 diabetes mellitus with hyperglycemia: Secondary | ICD-10-CM | POA: Diagnosis not present

## 2022-09-10 DIAGNOSIS — Z1159 Encounter for screening for other viral diseases: Secondary | ICD-10-CM | POA: Diagnosis not present

## 2022-09-10 DIAGNOSIS — I10 Essential (primary) hypertension: Secondary | ICD-10-CM | POA: Diagnosis not present

## 2022-09-10 DIAGNOSIS — G720 Drug-induced myopathy: Secondary | ICD-10-CM

## 2022-09-10 DIAGNOSIS — R300 Dysuria: Secondary | ICD-10-CM | POA: Diagnosis not present

## 2022-09-10 DIAGNOSIS — R1030 Lower abdominal pain, unspecified: Secondary | ICD-10-CM

## 2022-09-10 LAB — LIPID PANEL
Cholesterol: 204 mg/dL — ABNORMAL HIGH (ref 0–200)
HDL: 52.7 mg/dL (ref >39.00)
LDL Cholesterol: 121 mg/dL — ABNORMAL HIGH (ref 0–99)
NonHDL: 151.34
Total CHOL/HDL Ratio: 4
Triglycerides: 153 mg/dL — ABNORMAL HIGH (ref 0.0–149.0)
VLDL: 30.6 mg/dL (ref 0.0–40.0)

## 2022-09-10 LAB — COMPREHENSIVE METABOLIC PANEL
ALT: 12 U/L (ref 0–35)
AST: 14 U/L (ref 0–37)
Albumin: 4.1 g/dL (ref 3.5–5.2)
Alkaline Phosphatase: 94 U/L (ref 39–117)
BUN: 20 mg/dL (ref 6–23)
CO2: 28 mEq/L (ref 19–32)
Calcium: 9.4 mg/dL (ref 8.4–10.5)
Chloride: 97 mEq/L (ref 96–112)
Creatinine, Ser: 0.93 mg/dL (ref 0.40–1.20)
GFR: 60.98 mL/min (ref >60.00)
Glucose, Bld: 276 mg/dL — ABNORMAL HIGH (ref 70–99)
Potassium: 3.6 mEq/L (ref 3.5–5.1)
Sodium: 137 mEq/L (ref 135–145)
Total Bilirubin: 0.5 mg/dL (ref 0.2–1.2)
Total Protein: 7.4 g/dL (ref 6.0–8.3)

## 2022-09-10 LAB — CBC WITH DIFFERENTIAL/PLATELET
Basophils Absolute: 0.1 10*3/uL (ref 0.0–0.1)
Basophils Relative: 0.7 % (ref 0.0–3.0)
Eosinophils Absolute: 0.2 10*3/uL (ref 0.0–0.7)
Eosinophils Relative: 2.1 % (ref 0.0–5.0)
HCT: 40.3 % (ref 36.0–46.0)
Hemoglobin: 13.4 g/dL (ref 12.0–15.0)
Lymphocytes Relative: 28.8 % (ref 12.0–46.0)
Lymphs Abs: 2.3 10*3/uL (ref 0.7–4.0)
MCHC: 33.2 g/dL (ref 30.0–36.0)
MCV: 90.7 fl (ref 78.0–100.0)
Monocytes Absolute: 0.7 10*3/uL (ref 0.1–1.0)
Monocytes Relative: 8.7 % (ref 3.0–12.0)
Neutro Abs: 4.8 10*3/uL (ref 1.4–7.7)
Neutrophils Relative %: 59.7 % (ref 43.0–77.0)
Platelets: 281 10*3/uL (ref 150.0–400.0)
RBC: 4.44 Mil/uL (ref 3.87–5.11)
RDW: 13.7 % (ref 11.5–15.5)
WBC: 8 10*3/uL (ref 4.0–10.5)

## 2022-09-10 LAB — POC URINALSYSI DIPSTICK (AUTOMATED)
Bilirubin, UA: NEGATIVE
Blood, UA: NEGATIVE
Glucose, UA: POSITIVE — AB
Ketones, UA: NEGATIVE
Leukocytes, UA: NEGATIVE
Nitrite, UA: NEGATIVE
Protein, UA: NEGATIVE
Spec Grav, UA: 1.02 (ref 1.010–1.025)
Urobilinogen, UA: 0.2 E.U./dL
pH, UA: 5.5 (ref 5.0–8.0)

## 2022-09-10 LAB — HEMOGLOBIN A1C: Hgb A1c MFr Bld: 9.5 % — ABNORMAL HIGH (ref 4.6–6.5)

## 2022-09-10 MED ORDER — FREESTYLE LIBRE 3 PLUS SENSOR MISC
1.0000 | 11 refills | Status: DC
Start: 2022-09-10 — End: 2023-08-18

## 2022-09-10 MED ORDER — FREESTYLE LIBRE 3 PLUS SENSOR MISC
1.0000 | 11 refills | Status: DC
Start: 2022-09-10 — End: 2022-09-10

## 2022-09-10 MED ORDER — FREESTYLE LIBRE 3 READER DEVI
1.0000 | Freq: Once | 0 refills | Status: AC
Start: 2022-09-10 — End: 2022-09-10

## 2022-09-10 NOTE — Assessment & Plan Note (Signed)
Chronic.  Uncontrolled with hyperglycemia.  She has not been compliant with diet.  Exercise is limited due to chronic back pain.  She has not been taking lunchtime insulin.  Does not want to go back to endocrinology.  Continue Novolin are and NPH.  Discussed diet.  Labs.  Get copy of eye exam

## 2022-09-10 NOTE — Progress Notes (Signed)
1.  UA negative except sugar.  Culture in progress.  If willing, send in diflucan 150mg  x 1 to see if possibly yeast 2.  As expected sugars too high(but better than 6 months ago)-work on diet as discussed.  Was she able to get the Beatrice?  Hard to adjust insulin if not knowing what sugars are doing 3.  Cholesterol too high.  Is she willing to try the zetia 10mg #30/2

## 2022-09-10 NOTE — Assessment & Plan Note (Signed)
Chronic.  Controlled.  Continue metoprolol 50 mg twice daily and hydrochlorothiazide 50 mg daily

## 2022-09-10 NOTE — Assessment & Plan Note (Signed)
Chronic.  Uncontrolled.  Patient is intolerant of statins.  Currently, declines Zetia, but willing to try it if labs are abnormal.

## 2022-09-10 NOTE — Patient Instructions (Addendum)
It was very nice to see you today!  Check insurance on jardiance and rybelsis  PLEASE NOTE:  If you had any lab tests please let us know if you have not heard back within a few days. You may see your results on MyChart before we have a chance to review them but we will give you a call once they are reviewed by [place]. If we ordered any referrals today, please let us know if you have not heard from their office within the next week.   Please try these tips to maintain a healthy lifestyle:  Eat most of your calories during the day when you are active. Eliminate processed foods including packaged sweets (pies, cakes, cookies), reduce intake of potatoes, white bread, white pasta, and white rice. Look for whole grain options, oat flour or almond flour.  Each meal should contain half fruits/vegetables, one quarter protein, and one quarter carbs (no bigger than a computer mouse).  Cut down on sweet beverages. This includes juice, soda, and sweet tea. Also watch fruit intake, though this is a healthier sweet option, it still contains natural sugar! Limit to 3 servings daily.  Drink at least 1 glass of water with each meal and aim for at least 8 glasses per day  Exercise at least 150 minutes every week.

## 2022-09-10 NOTE — Progress Notes (Signed)
Subjective:     Patient ID: [name], female    DOB: 1950/12/03, 72 y.o.   MRN: [id number]  Chief Complaint  Patient presents with   Medical Management of Chronic Issues    3 month follow-up on htn, dm, hld Not fasting     HPI HTN - BP at today's visit  was 130/80. She is not regularly checking her BP at home. Patient is compliant with metoprolol 50 mg BID. And hydrochlorothiazide 50mg  daily  Blood Sugar / Type 2 DM - Patient states she is not regularly checking blood sugar at home. She is complaint with her Novolin 100 ml injections BID. States she has not been following a healthy diet consisting of fruits and vegetables. She does not use novolin R at lunch   Cervical/lumbar Spine Pain - States she experiences mild SOB that has continued since the onset of her cervical spine pain. Her spine pain has improved, except during movement which causes her to walk with a hunched back. Denies n/v/d. She was regularly receiving  her steroid spinal injections until July 2023.   Lower Abdominal Cramping - States she is experiencing  constipation and cramping. Denies diarrhea, fever, vaginal itching  or chills. Feels burning during and after urination.   Arthritis - Experiences sharp pains that are ongoing in her fingers on her right hand. Endorses purple/blue discoloration that accompanies the pain-worse later in day..   RSV Vaccine - After receiving her RSV vaccine experienced extreme swelling, discoloration and tenderness at the injection sight. Endorsed nausea and fatigue following her vaccination that subsided after a few days.     Health Maintenance Due  Topic Date Due   Medicare Annual Wellness (AWV)  Never done   Hepatitis C Screening  Never done   DTaP/Tdap/Td (2 - Td or Tdap) 07/27/2019   OPHTHALMOLOGY EXAM  06/24/2022    Past Medical History:  Diagnosis Date   Arthritis    C. difficile colitis    Diabetes mellitus    Diverticulitis    Fibromyalgia     Hypercholesteremia    Hypertension    IBS (irritable bowel syndrome)    Migraines    Nasal fracture    Stein-Leventhal ovaries     Past Surgical History:  Procedure Laterality Date   CHOLECYSTECTOMY  1998   KNEE ARTHROSCOPY Right 2017   KNEE ARTHROSCOPY Left    LEFT HEART CATHETERIZATION WITH CORONARY ANGIOGRAM N/A 12/19/2011   Procedure: LEFT HEART CATHETERIZATION WITH CORONARY ANGIOGRAM;  Surgeon: Donato Schultz, MD;  Location: [facility] CATH LAB;  Service: Cardiovascular;  Laterality: N/A;   ROTATOR CUFF REPAIR Right    TONSILLECTOMY  1959     Current Outpatient Medications:    ACCU-CHEK SMARTVIEW test strip, Use 3x a day, Disp: 300 each, Rfl: 4   acetaminophen (TYLENOL) 650 MG CR tablet, 2 tablets as needed, Disp: , Rfl:    acetaminophen-codeine (TYLENOL #3) 300-30 MG tablet, Take 1 tablet by mouth every 8 (eight) hours as needed for moderate pain., Disp: 60 tablet, Rfl: 0   albuterol (VENTOLIN HFA) 108 (90 Base) MCG/ACT inhaler, Inhale 2 puffs into the lungs every 6 (six) hours as needed for wheezing or shortness of breath., Disp: 8 g, Rfl: 0   BERBERINE CHLORIDE PO, Take 1 capsule by mouth 2 (two) times daily., Disp: , Rfl:    cholecalciferol (VITAMIN D) 1000 UNITS tablet, Take 1,000 Units by mouth every evening. , Disp: , Rfl:    CHROMIUM PO, Take 1 tablet by  mouth daily., Disp: , Rfl:    Continuous Glucose Receiver (FREESTYLE LIBRE 3 READER) DEVI, 1 each by Does not apply route once for 1 dose., Disp: 1 each, Rfl: 0   hydrochlorothiazide (HYDRODIURIL) 50 MG tablet, Take 1/2 (one-half) tablet by mouth twice daily, Disp: 90 tablet, Rfl: 2   HYDROcodone-acetaminophen (NORCO) 7.5-325 MG tablet, Take 1 tablet by mouth every 6 (six) hours as needed for moderate pain., Disp: 20 tablet, Rfl: 0   insulin NPH Human (NOVOLIN N) 100 UNIT/ML injection, Inject 0.3 mLs (30 Units total) into the skin 2 (two) times daily before a meal. (Patient taking differently: Inject 40 Units into the skin 2 (two)  times daily before a meal.), Disp: 20 mL, Rfl: 0   insulin regular (NOVOLIN R) 100 units/mL injection, Inject into the skin. 25 units before breakfast and dinner, 15-20 units before lunch, Disp: , Rfl:    Lancets (ACCU-CHEK MULTICLIX) lancets, Use as instructed to test 3x daily DX E11.65, Disp: 300 each, Rfl: 4   Magnesium 400 MG CAPS, Take 400 mg by mouth daily., Disp: , Rfl:    metoprolol tartrate (LOPRESSOR) 50 MG tablet, Take 0.5 tablets (25 mg total) by mouth 2 (two) times daily., Disp: 90 tablet, Rfl: 3   potassium chloride (KLOR-CON) 8 MEQ tablet, Take 8 mEq by mouth 2 (two) times daily., Disp: , Rfl:    PRESCRIPTION MEDICATION, Patient receives a Steroid injection in the neck once a year with Dr. Regino Schultze at [facility],The pt is unable to recall the exact name and i have been unable to verify name from the Dr's office. Patient also states " it shoots up my blood sugars really high, but i still get it because i can't take anything else for my neck pain", Disp: , Rfl:    RESVERATROL PO, Take by mouth., Disp: , Rfl:    tiZANidine (ZANAFLEX) 4 MG tablet, Take 1 tablet (4 mg total) by mouth every 6 (six) hours as needed for muscle spasms., Disp: 30 tablet, Rfl: 3   Continuous Glucose Sensor (FREESTYLE LIBRE 3 PLUS SENSOR) MISC, 1 each by Does not apply route every 14 (fourteen) days., Disp: 2 each, Rfl: 11  Allergies  Allergen Reactions   Ceftriaxone Hives, Itching and Other (See Comments)   Dicyclomine Hives    Hives, itching, swelling, vomiting, felt like something was sitting on chest   Erythromycin Anaphylaxis and Other (See Comments)   Iodinated Contrast Media Hives, Other (See Comments) and Shortness Of Breath   Amoxicillin-Pot Clavulanate Nausea And Vomiting    Nausea, vomiting, headache, dizziness. Hives on 08/2019 Other reaction(s): hives, Other Nausea, vomiting, headache, dizziness   pt states she can take other Penicillins just not augmentin Nausea, vomiting, headache,  dizziness. Hives on 08/2019    Canagliflozin Rash and Other (See Comments)    Dehydration Dehydration Dehydration    Statins Other (See Comments)    Severe muscle pain/weakness Muscle pain/ weekness    Sulfa Antibiotics Other (See Comments)    blackout PER PT SHE BLACKS OUT    Cipro [Ciprofloxacin Hcl] Other (See Comments)   Corticosteroids Other (See Comments)   Insulin Glargine Other (See Comments)    Severe ankle swelling   Metformin And Related Diarrhea and Nausea And Vomiting    Violently ill for 2 yrs   Metformin Hcl Other (See Comments)   Scallops [Shellfish Allergy] Diarrhea and Nausea And Vomiting    Severe GI upset   Tramadol Hcl Other (See Comments)   Nsaids Other (  See Comments)    Stomach problems Stomach problems    Rsv Mrna Vaccine Rash    Swelling arm, hot, fatigue   Tramadol Rash   ROS neg/noncontributory except as noted HPI/below      Objective:     BP 130/80   Pulse 76   Temp 97.9 F (36.6 C) (Temporal)   Resp 18   Ht 5\' 5"  (1.651 m)   SpO2 99%   BMI 31.28 kg/m  Wt Readings from Last 3 Encounters:  08/08/21 188 lb (85.3 kg)  03/17/21 188 lb (85.3 kg)  03/06/21 189 lb (85.7 kg)    Physical Exam   Gen: WDWN NAD HEENT: NCAT, conjunctiva not injected, sclera nonicteric NECK:  supple, no thyromegaly, no nodes, no carotid bruits CARDIAC: RRR, S1S2+, no murmur. DP 2+B LUNGS: CTAB. No wheezes ABDOMEN:  BS+, soft, No HSM, no masses, + LUQ TTP EXT:  +edema left ankle-<1+ MSK: no gross abnormalities. Walking with cane and stooped. NEURO: A&O x3.  CN II-XII intact.  PSYCH: normal mood. Good eye contact  UA neg    Assessment & Plan:  Essential hypertension Assessment & Plan: Chronic.  Controlled.  Continue metoprolol 50 mg twice daily and hydrochlorothiazide 50 mg daily  Orders: -     Comprehensive metabolic panel -     CBC with Differential/Platelet  Type 2 diabetes mellitus with hyperglycemia, with long-term current use of insulin  (HCC) Assessment & Plan: Chronic.  Uncontrolled with hyperglycemia.  She has not been compliant with diet.  Exercise is limited due to chronic back pain.  She has not been taking lunchtime insulin.  Does not want to go back to endocrinology.  Continue Novolin are and NPH.  Discussed diet.  Labs.  Get copy of eye exam  Orders: -     Comprehensive metabolic panel -     Hemoglobin A1c -     FreeStyle Libre 3 Reader; 1 each by Does not apply route once for 1 dose.  Dispense: 1 each; Refill: 0 -     FreeStyle Libre 3 Plus Sensor; 1 each by Does not apply route every 14 (fourteen) days.  Dispense: 2 each; Refill: 11  Pure hypercholesterolemia Assessment & Plan: Chronic.  Uncontrolled.  Patient is intolerant of statins.  Currently, declines Zetia, but willing to try it if labs are abnormal.  Orders: -     Lipid panel  Long-term insulin use (HCC) -     FreeStyle Libre 3 Reader; 1 each by Does not apply route once for 1 dose.  Dispense: 1 each; Refill: 0 -     FreeStyle Libre 3 Plus Sensor; 1 each by Does not apply route every 14 (fourteen) days.  Dispense: 2 each; Refill: 11  Lower abdominal pain -     POCT Urinalysis Dipstick (Automated) -     Urine Culture  Dysuria -     POCT Urinalysis Dipstick (Automated) -     Urine Culture  Screening for viral disease -     Hepatitis C antibody  Drug-induced myopathy  Dysuria-UA was negative.  Will send culture.  Question yeast vaginitis.  Lower abdominal pain-UA negative, await culture.  Could be diverticulitis starting.  Will check labs.  She prefers not doing a CT at this time.  Return in about 3 months (around 12/11/2022) for chronic follow-up.  Melina Fiddler N Rice,acting as a scribe for Angelena Sole, MD.,have documented all relevant documentation on the behalf of Angelena Sole, MD,as directed by  Ruffin Frederick  Ruthine Dose, MD while in the presence of Angelena Sole, MD.   I, Angelena Sole, MD, have reviewed all documentation for this visit. The documentation on  09/10/22 for the exam, diagnosis, procedures, and orders are all accurate and complete.    Angelena Sole, MD

## 2022-09-11 ENCOUNTER — Other Ambulatory Visit: Payer: Self-pay | Admitting: *Deleted

## 2022-09-11 MED ORDER — EZETIMIBE 10 MG PO TABS: 10.0000 mg | ORAL_TABLET | Freq: Every day | ORAL | 2 refills | Status: DC

## 2022-09-11 MED ORDER — FLUCONAZOLE 150 MG PO TABS: 150.0000 mg | ORAL_TABLET | Freq: Once | ORAL | 0 refills | Status: AC

## 2022-09-17 ENCOUNTER — Other Ambulatory Visit (HOSPITAL_COMMUNITY): Payer: Self-pay

## 2022-09-18 ENCOUNTER — Telehealth: Payer: Self-pay

## 2022-09-18 ENCOUNTER — Other Ambulatory Visit (HOSPITAL_COMMUNITY): Payer: Self-pay

## 2022-09-18 ENCOUNTER — Encounter: Payer: Self-pay | Admitting: *Deleted

## 2022-09-18 NOTE — Telephone Encounter (Signed)
Pharmacy Patient Advocate Encounter  Received notification from [facility] that Prior Authorization for [facility] 3 Reader has been APPROVED from 09/18/22 to 02/04/23  to be filled under Medicare Part B   PA #/Case ID/Reference #: [id number]  Placed a call to [employer] to notify of the approval and they stated they are not able to bill to Medicare part B but she can use a smarthphone in place of the reader.

## 2022-09-18 NOTE — Telephone Encounter (Signed)
Patient notified of message below.

## 2022-10-28 ENCOUNTER — Ambulatory Visit (INDEPENDENT_AMBULATORY_CARE_PROVIDER_SITE_OTHER): Payer: Medicare HMO | Admitting: Family

## 2022-10-28 ENCOUNTER — Encounter: Payer: Self-pay | Admitting: Family

## 2022-10-28 VITALS — BP 122/70 | HR 79 | Temp 97.7°F | Resp 18 | Ht 65.0 in

## 2022-10-28 DIAGNOSIS — L0591 Pilonidal cyst without abscess: Secondary | ICD-10-CM | POA: Diagnosis not present

## 2022-10-28 DIAGNOSIS — S60211A Contusion of right wrist, initial encounter: Secondary | ICD-10-CM | POA: Diagnosis not present

## 2022-10-28 MED ORDER — DOXYCYCLINE HYCLATE 100 MG PO TABS
100.0000 mg | ORAL_TABLET | Freq: Two times a day (BID) | ORAL | 0 refills | Status: AC
Start: 2022-10-28 — End: 2022-11-07

## 2022-10-28 NOTE — Progress Notes (Signed)
Patient ID: [name], female    DOB: Dec 31, 1950, 72 y.o.   MRN: [id number]  Chief Complaint  Patient presents with   Cyst    Cyst on tailbone, bursted earlier this morning, pain in better   Wrist Injury    Injured right wrist a couple of weeks ago, want to make sure it is okay, fell trying to feed cat   *Discussed the use of AI scribe software for clinical note transcription with the patient, who gave verbal consent to proceed.  History of Present Illness   The patient, with a history of arthritis, presents with a right wrist injury sustained from a fall a couple of weeks ago. The wrist is still swollen and painful, especially when leaning on it or using a computer. The patient reports that the wrist did not bruise significantly but has remained swollen. The patient applied ice once immediately after the injury and has been taking Tylenol and ibuprofen regularly for arthritis pain.  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 The rupture provided some relief from the pain. The patient has been cleaning the area and trying to drain the cyst. The patient has a history of needing antibiotics to resolve these cysts. She reports sitting long periods d/t her work at home and also was driving for 6hours on vacation recently which may have exacerbated the problem.      Assessment & Plan:     Right Wrist Contusion - Pain and swelling persisting for a couple of weeks after a fall. Old bruising noted, yellow-green discoloration present. Pain exacerbated by computer work. Pt has full ROM of wrist without pain. -Continue over-the-counter pain management with Tylenol and ibuprofen. -Apply ice for 15-20 minutes several times a day to continue to try and reduce swelling. -Consider a compression sleeve for support during typing and can help decrease swelling. -Prop wrist on a pillow  during sleep to reduce swelling.  Pilonidal Cyst - Recurrent issue, with the current episode having spontaneously drained. Pain significantly reduced post-drainage. Another small cyst noted just below the drained one. -Sending Doxycycline for 10 days, with the option to stop after 7 days if symptoms resolve. -Advise patient to keep the area clean with soap and water, and to use gauze to catch any further drainage. -Recommend warm, moist heat application to help with absorption. -Suggest the use of a pressure relief cushion to prevent future cysts due to prolonged sitting, taking frequent breaks to stand, walk around.      Subjective:    Outpatient Medications Prior to Visit  Medication Sig Dispense Refill   ACCU-CHEK SMARTVIEW test strip Use 3x a day 300 each 4   acetaminophen (TYLENOL) 650 MG CR tablet 2 tablets as needed     acetaminophen-codeine (TYLENOL #3) 300-30 MG tablet Take 1 tablet by mouth every 8 (eight) hours as needed for moderate pain. 60 tablet 0   albuterol (VENTOLIN HFA) 108 (90 Base) MCG/ACT inhaler Inhale 2 puffs into the lungs every 6 (six) hours as needed for wheezing or shortness of breath. 8 g 0   BERBERINE CHLORIDE PO Take 1 capsule by mouth 2 (two) times daily.     cholecalciferol (VITAMIN D) 1000 UNITS tablet Take 1,000 Units by mouth every evening.      CHROMIUM PO Take 1 tablet by mouth daily.     Continuous Glucose Sensor (FREESTYLE  LIBRE 3 PLUS SENSOR) MISC 1 each by Does not apply route every 14 (fourteen) days. 2 each 11   ezetimibe (ZETIA) 10 MG tablet Take 1 tablet (10 mg total) by mouth daily. 30 tablet 2   hydrochlorothiazide (HYDRODIURIL) 50 MG tablet Take 1/2 (one-half) tablet by mouth twice daily 90 tablet 2   HYDROcodone-acetaminophen (NORCO) 7.5-325 MG tablet Take 1 tablet by mouth every 6 (six) hours as needed for moderate pain. 20 tablet 0   insulin NPH Human (NOVOLIN N) 100 UNIT/ML injection Inject 0.3 mLs (30 Units total) into the skin 2 (two)  times daily before a meal. (Patient taking differently: Inject 40 Units into the skin 2 (two) times daily before a meal.) 20 mL 0   insulin regular (NOVOLIN R) 100 units/mL injection Inject into the skin. 25 units before breakfast and dinner, 15-20 units before lunch     Lancets (ACCU-CHEK MULTICLIX) lancets Use as instructed to test 3x daily DX E11.65 300 each 4   Magnesium 400 MG CAPS Take 400 mg by mouth daily.     metoprolol tartrate (LOPRESSOR) 50 MG tablet Take 0.5 tablets (25 mg total) by mouth 2 (two) times daily. 90 tablet 3   potassium chloride (KLOR-CON) 8 MEQ tablet Take 8 mEq by mouth 2 (two) times daily.     PRESCRIPTION MEDICATION Patient receives a Steroid injection in the neck once a year with Dr. Regino Schultze at [facility] pt is unable to recall the exact name and i have been unable to verify name from the Dr's office. Patient also states " it shoots up my blood sugars really high, but i still get it because i can't take anything else for my neck pain"     RESVERATROL PO Take by mouth.     tiZANidine (ZANAFLEX) 4 MG tablet Take 1 tablet (4 mg total) by mouth every 6 (six) hours as needed for muscle spasms. 30 tablet 3   No facility-administered medications prior to visit.   Past Medical History:  Diagnosis Date   Arthritis    C. difficile colitis    Diabetes mellitus    Diverticulitis    Fibromyalgia    Hypercholesteremia    Hypertension    IBS (irritable bowel syndrome)    Migraines    Nasal fracture    Stein-Leventhal ovaries    Past Surgical History:  Procedure Laterality Date   CHOLECYSTECTOMY  1998   KNEE ARTHROSCOPY Right 2017   KNEE ARTHROSCOPY Left    LEFT HEART CATHETERIZATION WITH CORONARY ANGIOGRAM N/A 12/19/2011   Procedure: LEFT HEART CATHETERIZATION WITH CORONARY ANGIOGRAM;  Surgeon: Donato Schultz, MD;  Location: [facility] CATH LAB;  Service: Cardiovascular;  Laterality: N/A;   ROTATOR CUFF REPAIR Right    TONSILLECTOMY  1959   Allergies  Allergen  Reactions   Ceftriaxone Hives, Itching and Other (See Comments)   Dicyclomine Hives    Hives, itching, swelling, vomiting, felt like something was sitting on chest   Erythromycin Anaphylaxis and Other (See Comments)   Iodinated Contrast Media Hives, Other (See Comments) and Shortness Of Breath   Amoxicillin-Pot Clavulanate Nausea And Vomiting    Nausea, vomiting, headache, dizziness. Hives on 08/2019 Other reaction(s): hives, Other Nausea, vomiting, headache, dizziness   pt states she can take other Penicillins just not augmentin Nausea, vomiting, headache, dizziness. Hives on 08/2019    Canagliflozin Rash and Other (See Comments)    Dehydration Dehydration Dehydration    Statins Other (See Comments)    Severe muscle pain/weakness Muscle  pain/ weekness    Sulfa Antibiotics Other (See Comments)    blackout PER PT SHE BLACKS OUT    Cipro [Ciprofloxacin Hcl] Other (See Comments)   Corticosteroids Other (See Comments)   Insulin Glargine Other (See Comments)    Severe ankle swelling   Metformin And Related Diarrhea and Nausea And Vomiting    Violently ill for 2 yrs   Metformin Hcl Other (See Comments)   Scallops [Shellfish Allergy] Diarrhea and Nausea And Vomiting    Severe GI upset   Tramadol Hcl Other (See Comments)   Nsaids Other (See Comments)    Stomach problems Stomach problems    Rsv Mrna Vaccine Rash    Swelling arm, hot, fatigue   Tramadol Rash      Objective:    Physical Exam Vitals and nursing note reviewed.  Constitutional:      Appearance: Normal appearance.  Cardiovascular:     Rate and Rhythm: Normal rate and regular rhythm.  Pulmonary:     Effort: Pulmonary effort is normal.     Breath sounds: Normal breath sounds.  Musculoskeletal:     Right wrist: Swelling (mild on lateral wrist at the joint, with very mild, light yellow bruising noted on lateral wrist, pain with palpation) present. Normal range of motion.       Legs:  Skin:    General: Skin  is warm and dry.     Findings: Abscess (top of buttock crack, small opening with minimal pus draining; approx. 1cm diameter raised pus-filled cyst noted just below draining cyst, see diagram) present.  Neurological:     Mental Status: She is alert.  Psychiatric:        Mood and Affect: Mood normal.        Behavior: Behavior normal.    BP 122/70   Pulse 79   Temp 97.7 F (36.5 C) (Temporal)   Resp 18   Ht 5\' 5"  (1.651 m)   SpO2 99%   BMI 31.28 kg/m  Wt Readings from Last 3 Encounters:  08/08/21 188 lb (85.3 kg)  03/17/21 188 lb (85.3 kg)  03/06/21 189 lb (85.7 kg)      Dulce Sellar, NP

## 2022-11-13 ENCOUNTER — Encounter: Payer: Self-pay | Admitting: Family

## 2022-11-13 ENCOUNTER — Ambulatory Visit (INDEPENDENT_AMBULATORY_CARE_PROVIDER_SITE_OTHER): Payer: Medicare HMO | Admitting: Family

## 2022-11-13 VITALS — BP 143/84 | HR 88 | Temp 97.1°F | Ht 65.0 in

## 2022-11-13 DIAGNOSIS — K5792 Diverticulitis of intestine, part unspecified, without perforation or abscess without bleeding: Secondary | ICD-10-CM | POA: Diagnosis not present

## 2022-11-13 MED ORDER — METRONIDAZOLE 500 MG PO TABS
500.0000 mg | ORAL_TABLET | Freq: Three times a day (TID) | ORAL | 0 refills | Status: AC
Start: 2022-11-13 — End: 2022-11-23

## 2022-11-13 NOTE — Progress Notes (Signed)
Patient ID: [name], female    DOB: 23-May-1950, 72 y.o.   MRN: [id number]  Chief Complaint  Patient presents with   Abdominal Pain    Pt c/o abdominal pain,bloating, lack of appetite, belching for 2 days. Pt states she has diverticulitis flare ups.      *Discussed the use of AI scribe software for clinical note transcription with the patient, who gave verbal consent to proceed.  History of Present Illness   The patient, with a history of diverticulitis, presents with abdominal pain and bloating that started over the weekend. They describe the pain as intensifying when they need to defecate and also report loss of appetite and excessive belching. They have previously been to the ER with acute pain and violent diarrhea. They report that their symptoms are similar to a previous episode of diverticulitis diagnosed via CT scan. They also have a history of back and muscle pain, for which they take tizanidine, a muscle relaxer. They note that the muscle relaxer can sometimes help with the abdominal cramping but can also cause constipation. They also report that they recently visited their aunt in [place] and ate out frequently, which may have aggravated their symptoms.         Assessment & Plan:     Diverticulitis - Recurrent episodes with recent exacerbation over the weekend. Symptoms include abdominal pain, bloating, loss of appetite, and belching. Previous CT scan showed mild diverticulitis. -Start Flagyl as previously effective for symptom resolution. -Advised to follow a liquid diet initially, then slowly introduce bland foods. -Continue Tizanidine as needed, primarily at night due to drowsiness side effect. -Contact office if symptoms do not improve.      Subjective:    Outpatient Medications Prior to Visit  Medication Sig Dispense Refill   ACCU-CHEK SMARTVIEW test strip Use 3x a day 300 each 4   acetaminophen (TYLENOL) 650 MG CR tablet 2 tablets as needed      acetaminophen-codeine (TYLENOL #3) 300-30 MG tablet Take 1 tablet by mouth every 8 (eight) hours as needed for moderate pain. 60 tablet 0   albuterol (VENTOLIN HFA) 108 (90 Base) MCG/ACT inhaler Inhale 2 puffs into the lungs every 6 (six) hours as needed for wheezing or shortness of breath. 8 g 0   BERBERINE CHLORIDE PO Take 1 capsule by mouth 2 (two) times daily.     cholecalciferol (VITAMIN D) 1000 UNITS tablet Take 1,000 Units by mouth every evening.      CHROMIUM PO Take 1 tablet by mouth daily.     Continuous Glucose Sensor (FREESTYLE LIBRE 3 PLUS SENSOR) MISC 1 each by Does not apply route every 14 (fourteen) days. 2 each 11   ezetimibe (ZETIA) 10 MG tablet Take 1 tablet (10 mg total) by mouth daily. 30 tablet 2   hydrochlorothiazide (HYDRODIURIL) 50 MG tablet Take 1/2 (one-half) tablet by mouth twice daily 90 tablet 2   HYDROcodone-acetaminophen (NORCO) 7.5-325 MG tablet Take 1 tablet by mouth every 6 (six) hours as needed for moderate pain. 20 tablet 0   insulin NPH Human (NOVOLIN N) 100 UNIT/ML injection Inject 0.3 mLs (30 Units total) into the skin 2 (two) times daily before a meal. (Patient taking differently: Inject 40 Units into the skin 2 (two) times daily before a meal.) 20 mL 0   insulin regular (NOVOLIN R) 100 units/mL injection Inject into the skin. 25 units before breakfast and dinner, 15-20 units before lunch     Lancets (ACCU-CHEK MULTICLIX) lancets Use as  instructed to test 3x daily DX E11.65 300 each 4   Magnesium 400 MG CAPS Take 400 mg by mouth daily.     metoprolol tartrate (LOPRESSOR) 50 MG tablet Take 0.5 tablets (25 mg total) by mouth 2 (two) times daily. 90 tablet 3   potassium chloride (KLOR-CON) 8 MEQ tablet Take 8 mEq by mouth 2 (two) times daily.     PRESCRIPTION MEDICATION Patient receives a Steroid injection in the neck once a year with Dr. Regino Schultze at [facility] pt is unable to recall the exact name and i have been unable to verify name from the Dr's office.  Patient also states " it shoots up my blood sugars really high, but i still get it because i can't take anything else for my neck pain"     RESVERATROL PO Take by mouth.     tiZANidine (ZANAFLEX) 4 MG tablet Take 1 tablet (4 mg total) by mouth every 6 (six) hours as needed for muscle spasms. 30 tablet 3   No facility-administered medications prior to visit.   Past Medical History:  Diagnosis Date   Arthritis    C. difficile colitis    Diabetes mellitus    Diverticulitis    Fibromyalgia    Hypercholesteremia    Hypertension    IBS (irritable bowel syndrome)    Migraines    Nasal fracture    Stein-Leventhal ovaries    Past Surgical History:  Procedure Laterality Date   CHOLECYSTECTOMY  1998   KNEE ARTHROSCOPY Right 2017   KNEE ARTHROSCOPY Left    LEFT HEART CATHETERIZATION WITH CORONARY ANGIOGRAM N/A 12/19/2011   Procedure: LEFT HEART CATHETERIZATION WITH CORONARY ANGIOGRAM;  Surgeon: Donato Schultz, MD;  Location: [facility] CATH LAB;  Service: Cardiovascular;  Laterality: N/A;   ROTATOR CUFF REPAIR Right    TONSILLECTOMY  1959   Allergies  Allergen Reactions   Ceftriaxone Hives, Itching and Other (See Comments)   Dicyclomine Hives    Hives, itching, swelling, vomiting, felt like something was sitting on chest   Erythromycin Anaphylaxis and Other (See Comments)   Iodinated Contrast Media Hives, Other (See Comments) and Shortness Of Breath   Amoxicillin-Pot Clavulanate Nausea And Vomiting    Nausea, vomiting, headache, dizziness. Hives on 08/2019 Other reaction(s): hives, Other Nausea, vomiting, headache, dizziness   pt states she can take other Penicillins just not augmentin Nausea, vomiting, headache, dizziness. Hives on 08/2019    Canagliflozin Rash and Other (See Comments)    Dehydration Dehydration Dehydration    Statins Other (See Comments)    Severe muscle pain/weakness Muscle pain/ weekness    Sulfa Antibiotics Other (See Comments)    blackout PER PT SHE BLACKS OUT     Cipro [Ciprofloxacin Hcl] Other (See Comments)   Corticosteroids Other (See Comments)   Insulin Glargine Other (See Comments)    Severe ankle swelling   Metformin And Related Diarrhea and Nausea And Vomiting    Violently ill for 2 yrs   Metformin Hcl Other (See Comments)   Scallops [Shellfish Allergy] Diarrhea and Nausea And Vomiting    Severe GI upset   Tramadol Hcl Other (See Comments)   Nsaids Other (See Comments)    Stomach problems Stomach problems    Rsv Mrna Vaccine Rash    Left Swelling arm, hot, fatigue   Tramadol Rash      Objective:    Physical Exam Vitals and nursing note reviewed.  Constitutional:      Appearance: Normal appearance. She is obese.  Cardiovascular:  Rate and Rhythm: Normal rate and regular rhythm.  Pulmonary:     Effort: Pulmonary effort is normal.     Breath sounds: Normal breath sounds.  Musculoskeletal:        General: Normal range of motion.  Skin:    General: Skin is warm and dry.  Neurological:     Mental Status: She is alert.  Psychiatric:        Mood and Affect: Mood normal.        Behavior: Behavior normal.    BP (!) 143/84 (BP Location: Left Arm, Patient Position: Sitting, Cuff Size: Large)   Pulse 88   Temp (!) 97.1 F (36.2 C) (Temporal)   Ht 5\' 5"  (1.651 m)   SpO2 99%   BMI 31.28 kg/m  Wt Readings from Last 3 Encounters:  08/08/21 188 lb (85.3 kg)  03/17/21 188 lb (85.3 kg)  03/06/21 189 lb (85.7 kg)       Dulce Sellar, NP

## 2022-12-03 ENCOUNTER — Emergency Department (HOSPITAL_BASED_OUTPATIENT_CLINIC_OR_DEPARTMENT_OTHER): Payer: Medicare HMO

## 2022-12-03 ENCOUNTER — Other Ambulatory Visit: Payer: Self-pay

## 2022-12-03 ENCOUNTER — Encounter (HOSPITAL_BASED_OUTPATIENT_CLINIC_OR_DEPARTMENT_OTHER): Payer: Self-pay | Admitting: *Deleted

## 2022-12-03 ENCOUNTER — Emergency Department (HOSPITAL_BASED_OUTPATIENT_CLINIC_OR_DEPARTMENT_OTHER)
Admission: EM | Admit: 2022-12-03 | Discharge: 2022-12-03 | Disposition: A | Discharge status: Home / Self Care | Payer: Medicare HMO | Source: Home / Self Care | Attending: Emergency Medicine | Admitting: Emergency Medicine

## 2022-12-03 ENCOUNTER — Emergency Department (HOSPITAL_BASED_OUTPATIENT_CLINIC_OR_DEPARTMENT_OTHER)
Admission: EM | Admit: 2022-12-03 | Discharge: 2022-12-03 | Disposition: A | Discharge status: Home / Self Care | Payer: Medicare HMO | Attending: Emergency Medicine | Admitting: Emergency Medicine

## 2022-12-03 ENCOUNTER — Encounter (HOSPITAL_BASED_OUTPATIENT_CLINIC_OR_DEPARTMENT_OTHER): Payer: Self-pay | Admitting: Emergency Medicine

## 2022-12-03 ENCOUNTER — Other Ambulatory Visit (HOSPITAL_BASED_OUTPATIENT_CLINIC_OR_DEPARTMENT_OTHER): Payer: Self-pay

## 2022-12-03 DIAGNOSIS — K5792 Diverticulitis of intestine, part unspecified, without perforation or abscess without bleeding: Secondary | ICD-10-CM

## 2022-12-03 DIAGNOSIS — R1030 Lower abdominal pain, unspecified: Secondary | ICD-10-CM | POA: Diagnosis present

## 2022-12-03 DIAGNOSIS — T7840XA Allergy, unspecified, initial encounter: Secondary | ICD-10-CM | POA: Insufficient documentation

## 2022-12-03 DIAGNOSIS — T368X5A Adverse effect of other systemic antibiotics, initial encounter: Secondary | ICD-10-CM | POA: Insufficient documentation

## 2022-12-03 DIAGNOSIS — K5732 Diverticulitis of large intestine without perforation or abscess without bleeding: Secondary | ICD-10-CM | POA: Insufficient documentation

## 2022-12-03 DIAGNOSIS — Z794 Long term (current) use of insulin: Secondary | ICD-10-CM | POA: Insufficient documentation

## 2022-12-03 DIAGNOSIS — D72829 Elevated white blood cell count, unspecified: Secondary | ICD-10-CM | POA: Insufficient documentation

## 2022-12-03 DIAGNOSIS — K529 Noninfective gastroenteritis and colitis, unspecified: Secondary | ICD-10-CM | POA: Insufficient documentation

## 2022-12-03 DIAGNOSIS — R1032 Left lower quadrant pain: Secondary | ICD-10-CM | POA: Diagnosis not present

## 2022-12-03 DIAGNOSIS — K5900 Constipation, unspecified: Secondary | ICD-10-CM | POA: Diagnosis not present

## 2022-12-03 LAB — CBC
HCT: 40 % (ref 36.0–46.0)
Hemoglobin: 14.1 g/dL (ref 12.0–15.0)
MCH: 30.7 pg (ref 26.0–34.0)
MCHC: 35.3 g/dL (ref 30.0–36.0)
MCV: 87 fL (ref 80.0–100.0)
Platelets: 262 10*3/uL (ref 150–400)
RBC: 4.6 MIL/uL (ref 3.87–5.11)
RDW: 12.6 % (ref 11.5–15.5)
WBC: 13.7 10*3/uL — ABNORMAL HIGH (ref 4.0–10.5)
nRBC: 0 % (ref 0.0–0.2)

## 2022-12-03 LAB — URINALYSIS, ROUTINE W REFLEX MICROSCOPIC
Bilirubin Urine: NEGATIVE
Glucose, UA: >1000 mg/dL — AB
Hgb urine dipstick: NEGATIVE
Ketones, ur: NEGATIVE mg/dL
Leukocytes,Ua: NEGATIVE
Nitrite: NEGATIVE
Protein, ur: 30 mg/dL — AB
Specific Gravity, Urine: 1.03 (ref 1.005–1.030)
pH: 5.5 (ref 5.0–8.0)

## 2022-12-03 LAB — COMPREHENSIVE METABOLIC PANEL
ALT: 11 U/L (ref 0–44)
AST: 16 U/L (ref 15–41)
Albumin: 3.8 g/dL (ref 3.5–5.0)
Alkaline Phosphatase: 81 U/L (ref 38–126)
Anion gap: 12 (ref 5–15)
BUN: 22 mg/dL (ref 8–23)
CO2: 29 mmol/L (ref 22–32)
Calcium: 10.3 mg/dL (ref 8.9–10.3)
Chloride: 94 mmol/L — ABNORMAL LOW (ref 98–111)
Creatinine, Ser: 0.95 mg/dL (ref 0.44–1.00)
GFR, Estimated: >60 mL/min (ref >60)
Glucose, Bld: 302 mg/dL — ABNORMAL HIGH (ref 70–99)
Potassium: 3.8 mmol/L (ref 3.5–5.1)
Sodium: 135 mmol/L (ref 135–145)
Total Bilirubin: 0.8 mg/dL (ref 0.3–1.2)
Total Protein: 7.6 g/dL (ref 6.5–8.1)

## 2022-12-03 LAB — LIPASE, BLOOD: Lipase: 17 U/L (ref 11–51)

## 2022-12-03 MED ORDER — CIPROFLOXACIN HCL 500 MG PO TABS
500.0000 mg | ORAL_TABLET | Freq: Once | ORAL | Status: AC
  Administered 2022-12-03: 500 mg via ORAL
  Filled 2022-12-03: qty 1

## 2022-12-03 MED ORDER — CIPROFLOXACIN HCL 500 MG PO TABS: 500.0000 mg | ORAL_TABLET | Freq: Two times a day (BID) | ORAL | 0 refills | Status: DC

## 2022-12-03 MED ORDER — AMOXICILLIN-POT CLAVULANATE 875-125 MG PO TABS
1.0000 | ORAL_TABLET | Freq: Two times a day (BID) | ORAL | 0 refills | Status: DC
  Filled 2022-12-03: qty 14, 7d supply, fill #0

## 2022-12-03 MED ORDER — METRONIDAZOLE 500 MG PO TABS: 500.0000 mg | ORAL_TABLET | Freq: Two times a day (BID) | ORAL | 0 refills | Status: DC

## 2022-12-03 MED ORDER — METRONIDAZOLE 500 MG PO TABS
500.0000 mg | ORAL_TABLET | Freq: Once | ORAL | Status: AC
  Administered 2022-12-03: 500 mg via ORAL
  Filled 2022-12-03: qty 1

## 2022-12-03 NOTE — ED Triage Notes (Signed)
Pt reports lower abdominal "spasms" since around 2200 last night. Nausea and vomiting. Constipation. Reports hx of diverticulitis and it feels similar- she was on flagyl at the beginning of the month for the same.

## 2022-12-03 NOTE — ED Provider Notes (Signed)
[place] EMERGENCY DEPARTMENT AT [facility] Provider Note   CSN: [id number] Arrival date & time: 12/03/22  0730     History  Chief Complaint  Patient presents with   Medication Reaction    [name] is a 72 y.o. female.  Patient here with itching to her hands.  She suspects allergic reaction to the ciprofloxacin that she was just prescribed along with Flagyl for diverticulitis.  She is taking Flagyl multiple times in the past without any issues.  Same with ciprofloxacin but not as often as Flagyl.  She just finished a course of Flagyl recently as well.  She denies any shortness of breath nausea vomiting, no swelling of the lips or tongue.  She has multiple drug allergies.  This started about an hour ago.  She drove back here, held on taking Benadryl.  The history is provided by the patient.       Home Medications Prior to Admission medications   Medication Sig Start Date End Date Taking? Authorizing Provider  amoxicillin-clavulanate (AUGMENTIN) 875-125 MG tablet Take 1 tablet by mouth every 12 (twelve) hours. 12/03/22  Yes Ailish Prospero, DO  ACCU-CHEK SMARTVIEW test strip Use 3x a day 02/15/22   Carlus Pavlov, MD  acetaminophen (TYLENOL) 650 MG CR tablet 2 tablets as needed    [provider]  albuterol (VENTOLIN HFA) 108 (90 Base) MCG/ACT inhaler Inhale 2 puffs into the lungs every 6 (six) hours as needed for wheezing or shortness of breath. 01/22/22   Jeani Sow, MD  cholecalciferol (VITAMIN D) 1000 UNITS tablet Take 1,000 Units by mouth every evening.     [provider]  ciprofloxacin (CIPRO) 500 MG tablet Take 1 tablet (500 mg total) by mouth 2 (two) times daily for 10 days. 12/03/22 12/13/22  Pollyann Savoy, MD  Continuous Glucose Sensor (FREESTYLE LIBRE 3 PLUS SENSOR) MISC 1 each by Does not apply route every 14 (fourteen) days. 09/10/22   Jeani Sow, MD  hydrochlorothiazide (HYDRODIURIL) 50 MG tablet Take 1/2 (one-half)  tablet by mouth twice daily 02/20/22   Jeani Sow, MD  HYDROcodone-acetaminophen [facility]) 7.5-325 MG tablet Take 1 tablet by mouth every 6 (six) hours as needed for moderate pain. 07/10/22   Jeani Sow, MD  insulin NPH Human (NOVOLIN N) 100 UNIT/ML injection Inject 0.3 mLs (30 Units total) into the skin 2 (two) times daily before a meal. Patient taking differently: Inject 40 Units into the skin 2 (two) times daily before a meal. 02/22/21   Carlus Pavlov, MD  insulin regular (NOVOLIN R) 100 units/mL injection Inject into the skin. 25 units before breakfast and dinner, 15-20 units before lunch 05/12/17   [provider]  Lancets (ACCU-CHEK MULTICLIX) lancets Use as instructed to test 3x daily DX E11.65 02/15/22   Carlus Pavlov, MD  metoprolol tartrate (LOPRESSOR) 50 MG tablet Take 0.5 tablets (25 mg total) by mouth 2 (two) times daily. 02/20/22   Jeani Sow, MD  metroNIDAZOLE (FLAGYL) 500 MG tablet Take 1 tablet (500 mg total) by mouth 2 (two) times daily for 10 days. 12/03/22 12/13/22  Pollyann Savoy, MD  potassium chloride (KLOR-CON) 8 MEQ tablet Take 8 mEq by mouth 2 (two) times daily.    [provider]  PRESCRIPTION MEDICATION Patient receives a Steroid injection in the neck once a year with Dr. Regino Schultze at [facility] pt is unable to recall the exact name and i have been unable to verify name from the Dr's office.  Patient also states " it shoots up my blood sugars really high, but i still get it because i can't take anything else for my neck pain"    [provider]  tiZANidine (ZANAFLEX) 4 MG tablet Take 1 tablet (4 mg total) by mouth every 6 (six) hours as needed for muscle spasms. 01/02/22   Jeani Sow, MD  gabapentin (NEURONTIN) 100 MG capsule Take 1 capsule (100 mg total) by mouth 3 (three) times daily. Patient not taking: Reported on 07/06/2019 07/07/17 07/06/19  Anson Fret, MD      Allergies    Ceftriaxone, Dicyclomine,  Erythromycin, Iodinated contrast media, Amoxicillin-pot clavulanate, Canagliflozin, Statins, Sulfa antibiotics, Corticosteroids, Insulin glargine, Metformin and related, Metformin hcl, Scallops [shellfish allergy], Tramadol hcl, Cipro [ciprofloxacin hcl], Nsaids, Rsv mrna vaccine, and Tramadol    Review of Systems   Review of Systems  Physical Exam Updated Vital Signs BP (!) 157/98   Pulse (!) 101   Resp 16   Wt 88 kg   SpO2 98%   BMI 32.28 kg/m  Physical Exam Vitals and nursing note reviewed.  Constitutional:      General: She is not in acute distress.    Appearance: She is well-developed. She is not ill-appearing.  HENT:     Head: Normocephalic and atraumatic.     Nose: Nose normal.     Mouth/Throat:     Mouth: Mucous membranes are moist.  Eyes:     Extraocular Movements: Extraocular movements intact.     Conjunctiva/sclera: Conjunctivae normal.     Pupils: Pupils are equal, round, and reactive to light.  Cardiovascular:     Rate and Rhythm: Normal rate and regular rhythm.     Pulses: Normal pulses.     Heart sounds: Normal heart sounds. No murmur heard. Pulmonary:     Effort: Pulmonary effort is normal. No respiratory distress.     Breath sounds: Normal breath sounds.  Abdominal:     Palpations: Abdomen is soft.     Tenderness: There is no abdominal tenderness.  Musculoskeletal:        General: No swelling.     Cervical back: Normal range of motion and neck supple.  Skin:    General: Skin is warm and dry.     Capillary Refill: Capillary refill takes less than 2 seconds.     Findings: Rash present.     Comments: Mild rash to the back of her hands, right forearm  Neurological:     Mental Status: She is alert.  Psychiatric:        Mood and Affect: Mood normal.     ED Results / Procedures / Treatments   Labs (all labs ordered are listed, but only abnormal results are displayed) Labs Reviewed - No data to display  EKG None  Radiology CT ABDOMEN PELVIS WO  CONTRAST  Result Date: 12/03/2022 CLINICAL DATA:  Left lower quadrant abdominal pain EXAM: CT ABDOMEN AND PELVIS WITHOUT CONTRAST TECHNIQUE: Multidetector CT imaging of the abdomen and pelvis was performed following the standard protocol without IV contrast. RADIATION DOSE REDUCTION: This exam was performed according to the departmental dose-optimization program which includes automated exposure control, adjustment of the mA and/or kV according to patient size and/or use of iterative reconstruction technique. COMPARISON:  01/03/2019 FINDINGS: Lower chest:  No contributory findings. Hepatobiliary: No focal liver abnormality.Cholecystectomy. No biliary dilatation Pancreas: Unremarkable. Spleen: Unremarkable. Adrenals/Urinary Tract: Negative adrenals. No hydronephrosis or stone. Unremarkable bladder. Stomach/Bowel: Fat stranding surrounds a diverticulum at the  upper sigmoid colon. No evidence of perforation or abscess. Colonic diverticulosis is primarily distal. No bowel obstruction or wall thickening. Moderate generalized stool retention. Vascular/Lymphatic: No acute vascular abnormality. Atheromatous calcification of the aorta and branch vessels. No mass or adenopathy. Reproductive:No pathologic findings. Other: No ascites or pneumoperitoneum. Musculoskeletal: No acute abnormalities. Generalized spinal degeneration. L4-5 and L5-S1 degenerative anterolisthesis. Lower thoracic bridging osteophytes. IMPRESSION: Mild diverticulitis at the upper sigmoid colon. Electronically Signed   By: Tiburcio Pea M.D.   On: 12/03/2022 05:48    Procedures Procedures    Medications Ordered in ED Medications - No data to display  ED Course/ Medical Decision Making/ A&P                                 Medical Decision Making Risk Prescription drug management.   Minnette Mukes is here with possible allergic reaction.  Some itchiness to her hands may be small hives.  Allergic reaction to Cipro.  She is taking  Flagyl multiple times.  She just took antibiotics for diverticulitis over an hour ago.  She has no signs of anaphylaxis on exam.  She is very well-appearing.  I talked with pharmacy given her multiple drug allergies.  I discussed with her and overall with Augmentin she states she got some dizziness from it.  Never had major anaphylaxis reaction or hives or major swelling from it.  She has tolerated amoxicillin in the past without any issues as well.  Shared decision was made to switch to Augmentin and if she got dizzy symptoms again to discontinue it and trial expectant management without antibiotics.  We discussed this option as well given that she has no fever and is well-appearing and has uncomplicated diverticulitis.  Often times now we do not need antibiotics for this we discussed.  But she preferred to try to give Augmentin a shot.  I made sure to tell her to make sure she eats and drinks with this.  I talked with pharmacy about this and they agree that this is likely the best plan of action.  She understands return precautions including fever, worsening pain.  Recommend Benadryl as needed.  On my reevaluation she was not having any symptoms anymore.  Looks like rash and hives and itching had resolved.  Patient discharged in good condition.  Understands return precautions.  This chart was dictated using voice recognition software.  Despite best efforts to proofread,  errors can occur which can change the documentation meaning.         Final Clinical Impression(s) / ED Diagnoses Final diagnoses:  Allergic reaction, initial encounter    Rx / DC Orders ED Discharge Orders          Ordered    amoxicillin-clavulanate (AUGMENTIN) 875-125 MG tablet  Every 12 hours        12/03/22 0829              Virgina Norfolk, DO 12/03/22 [phone]

## 2022-12-03 NOTE — ED Triage Notes (Signed)
Pt endorses allergic reaction to cipro. Was just d/c. Rash noted to RUE and RT flank. Pt reports itching

## 2022-12-03 NOTE — ED Provider Notes (Signed)
[place] [facility] DE[facility]MENT AT [facility]  Provider Note  CSN: [id number] Arrival date & time: 12/03/22 0343  History Chief Complaint  Patient presents with   Abdominal Pain    [name] is a 72 y.o. female with history of recurrent diverticulitis was seen at PCP office earlier this month for lower abdominal pain and bloating, given Rx for flagyl (has multiple other allergies) with improvement in symptoms. She has been doing well in recent days, although having some constipation. She reports onset of severe cramping lower abdominal cramping with N/V earlier tonight, not associated with fever, dysuria. No hematochezia or melena. Pain has improved some.    Home Medications Prior to Admission medications   Medication Sig Start Date End Date Taking? Authorizing Provider  ACCU-CHEK SMARTVIEW test strip Use 3x a day 02/15/22  Yes Carlus Pavlov, MD  acetaminophen (TYLENOL) 650 MG CR tablet 2 tablets as needed   Yes [provider]  albuterol (VENTOLIN HFA) 108 (90 Base) MCG/ACT inhaler Inhale 2 puffs into the lungs every 6 (six) hours as needed for wheezing or shortness of breath. 01/22/22  Yes Jeani Sow, MD  cholecalciferol (VITAMIN D) 1000 UNITS tablet Take 1,000 Units by mouth every evening.    Yes [provider]  ciprofloxacin (CIPRO) 500 MG tablet Take 1 tablet (500 mg total) by mouth 2 (two) times daily for 10 days. 12/03/22 12/13/22 Yes Pollyann Savoy, MD  Continuous Glucose Sensor (FREESTYLE LIBRE 3 PLUS SENSOR) MISC 1 each by Does not apply route every 14 (fourteen) days. 09/10/22  Yes Jeani Sow, MD  hydrochlorothiazide (HYDRODIURIL) 50 MG tablet Take 1/2 (one-half) tablet by mouth twice daily 02/20/22  Yes Jeani Sow, MD  HYDROcodone-acetaminophen [facility]) 7.5-325 MG tablet Take 1 tablet by mouth every 6 (six) hours as needed for moderate pain. 07/10/22  Yes Jeani Sow, MD  insulin NPH Human (NOVOLIN N) 100 UNIT/ML  injection Inject 0.3 mLs (30 Units total) into the skin 2 (two) times daily before a meal. Patient taking differently: Inject 40 Units into the skin 2 (two) times daily before a meal. 02/22/21  Yes Carlus Pavlov, MD  insulin regular (NOVOLIN R) 100 units/mL injection Inject into the skin. 25 units before breakfast and dinner, 15-20 units before lunch 05/12/17  Yes [provider]  Lancets (ACCU-CHEK MULTICLIX) lancets Use as instructed to test 3x daily DX E11.65 02/15/22  Yes Carlus Pavlov, MD  metoprolol tartrate (LOPRESSOR) 50 MG tablet Take 0.5 tablets (25 mg total) by mouth 2 (two) times daily. 02/20/22  Yes Jeani Sow, MD  metroNIDAZOLE (FLAGYL) 500 MG tablet Take 1 tablet (500 mg total) by mouth 2 (two) times daily for 10 days. 12/03/22 12/13/22 Yes Pollyann Savoy, MD  potassium chloride (KLOR-CON) 8 MEQ tablet Take 8 mEq by mouth 2 (two) times daily.   Yes [provider]  tiZANidine (ZANAFLEX) 4 MG tablet Take 1 tablet (4 mg total) by mouth every 6 (six) hours as needed for muscle spasms. 01/02/22  Yes Jeani Sow, MD  PRESCRIPTION MEDICATION Patient receives a Steroid injection in the neck once a year with Dr. Regino Schultze at [facility] pt is unable to recall the exact name and i have been unable to verify name from the Dr's office. Patient also states " it shoots up my blood sugars really high, but i still get it because i can't take anything else for my neck pain"    [provider]  gabapentin (  NEURONTIN) 100 MG capsule Take 1 capsule (100 mg total) by mouth 3 (three) times daily. Patient not taking: Reported on 07/06/2019 07/07/17 07/06/19  Anson Fret, MD     Allergies    Ceftriaxone, Dicyclomine, Erythromycin, Iodinated contrast media, Amoxicillin-pot clavulanate, Canagliflozin, Statins, Sulfa antibiotics, Cipro [ciprofloxacin hcl], Corticosteroids, Insulin glargine, Metformin and related, Metformin hcl, Scallops [shellfish allergy], Tramadol  hcl, Nsaids, Rsv mrna vaccine, and Tramadol   Review of Systems   Review of Systems Please see HPI for pertinent positives and negatives  Physical Exam BP (!) 188/84   Pulse 95   Temp 98.6 F (37 C) (Oral)   Resp 18   Ht 5\' 5"  (1.651 m)   Wt 88 kg   SpO2 98%   BMI 32.28 kg/m   Physical Exam Vitals and nursing note reviewed.  Constitutional:      Appearance: Normal appearance.  HENT:     Head: Normocephalic and atraumatic.     Nose: Nose normal.     Mouth/Throat:     Mouth: Mucous membranes are moist.  Eyes:     Extraocular Movements: Extraocular movements intact.     Conjunctiva/sclera: Conjunctivae normal.  Cardiovascular:     Rate and Rhythm: Normal rate.  Pulmonary:     Effort: Pulmonary effort is normal.     Breath sounds: Normal breath sounds.  Abdominal:     General: Abdomen is flat.     Palpations: Abdomen is soft.     Tenderness: There is no abdominal tenderness. There is no guarding. Negative signs include Murphy's sign and McBurney's sign.  Musculoskeletal:        General: No swelling. Normal range of motion.     Cervical back: Neck supple.  Skin:    General: Skin is warm and dry.  Neurological:     General: No focal deficit present.     Mental Status: She is alert.  Psychiatric:        Mood and Affect: Mood normal.     ED Results / Procedures / Treatments   EKG None  Procedures Procedures  Medications Ordered in the ED Medications  ciprofloxacin (CIPRO) tablet 500 mg (has no administration in time range)  metroNIDAZOLE (FLAGYL) tablet 500 mg (has no administration in time range)    Initial Impression and Plan  Patient here with lower abdominal pain and vomiting started suddenly earlier tonight, but has since improved. She has a benign abdomen now. Will check labs and send for CT.   ED Course   Clinical Course as of 12/03/22 0613  Tue Dec 03, 2022  0411 CBC with mild leukocytosis.  [CS]  0438 CMP with mild leukocytosis, otherwise  LFTs normal. Lipase is normal.  [CS]  0447 UA with glucosuria, but no infection.  [CS]  0606 I personally viewed the images from radiology studies and agree with radiologist interpretation: CT shows an area of uncomplicated diverticulitis. She has numerous drug allergies/intolerances listed, including Cipro, although she doesn't remember what if any reaction she had to this medication and thinks it may have been more side effect than anything else; definitely does not remember any kind of anaphylactic reaction. I discussed with her that single coverage with Flagyl may not completely cover her infection and after shared decision making, she would like to try Cipro and Flagyl combination and see if she has any side effects. Will give a dose prior to discharge but she is comfortable going home for now, PCP follow up, RTED for any other concerns.   [  CS]    Clinical Course User Index [CS] Pollyann Savoy, MD     MDM Rules/Calculators/A&P Medical Decision Making Problems Addressed: Diverticulitis: acute illness or injury  Amount and/or Complexity of Data Reviewed Labs: ordered. Decision-making details documented in ED Course. Radiology: ordered and independent interpretation performed. Decision-making details documented in ED Course.  Risk Prescription drug management.     Final Clinical Impression(s) / ED Diagnoses Final diagnoses:  Diverticulitis    Rx / DC Orders ED Discharge Orders          Ordered    ciprofloxacin (CIPRO) 500 MG tablet  2 times daily        12/03/22 0611    metroNIDAZOLE (FLAGYL) 500 MG tablet  2 times daily        12/03/22 0611             Pollyann Savoy, MD 12/03/22 [phone]

## 2022-12-03 NOTE — ED Notes (Signed)
No hives noted at time of discharge.

## 2022-12-03 NOTE — Discharge Instructions (Signed)
Use Benadryl as needed for itchiness.  As we discussed I would recommend discontinuing ciprofloxacin and Flagyl at this time and retry Augmentin as it does not sound like you had a severe anaphylactic reaction to this.  If you start to have dizziness and symptoms that you had when you last took it please discontinue.  At that time I would do expectant management and I would hope that diverticulitis was self resolved.  If you develop fever, worsening pain then please return for reevaluation as you likely would need IV antibiotics at that time.

## 2022-12-05 DIAGNOSIS — M19041 Primary osteoarthritis, right hand: Secondary | ICD-10-CM | POA: Diagnosis not present

## 2022-12-05 DIAGNOSIS — M25531 Pain in right wrist: Secondary | ICD-10-CM | POA: Diagnosis not present

## 2022-12-10 DIAGNOSIS — M47816 Spondylosis without myelopathy or radiculopathy, lumbar region: Secondary | ICD-10-CM | POA: Diagnosis not present

## 2022-12-11 ENCOUNTER — Ambulatory Visit: Payer: Medicare HMO | Admitting: Family Medicine

## 2022-12-11 ENCOUNTER — Telehealth: Payer: Self-pay

## 2022-12-11 ENCOUNTER — Encounter: Payer: Self-pay | Admitting: Family Medicine

## 2022-12-11 VITALS — BP 138/70 | HR 82 | Temp 97.6°F | Resp 16 | Ht 65.0 in

## 2022-12-11 DIAGNOSIS — Z789 Other specified health status: Secondary | ICD-10-CM | POA: Diagnosis not present

## 2022-12-11 DIAGNOSIS — E1165 Type 2 diabetes mellitus with hyperglycemia: Secondary | ICD-10-CM | POA: Diagnosis not present

## 2022-12-11 DIAGNOSIS — G43009 Migraine without aura, not intractable, without status migrainosus: Secondary | ICD-10-CM

## 2022-12-11 DIAGNOSIS — M5412 Radiculopathy, cervical region: Secondary | ICD-10-CM | POA: Diagnosis not present

## 2022-12-11 DIAGNOSIS — I10 Essential (primary) hypertension: Secondary | ICD-10-CM | POA: Diagnosis not present

## 2022-12-11 DIAGNOSIS — Z794 Long term (current) use of insulin: Secondary | ICD-10-CM | POA: Diagnosis not present

## 2022-12-11 DIAGNOSIS — E7800 Pure hypercholesterolemia, unspecified: Secondary | ICD-10-CM

## 2022-12-11 DIAGNOSIS — G720 Drug-induced myopathy: Secondary | ICD-10-CM | POA: Diagnosis not present

## 2022-12-11 MED ORDER — SEMAGLUTIDE(0.25 OR 0.5MG/DOS) 2 MG/3ML ~~LOC~~ SOPN: PEN_INJECTOR | SUBCUTANEOUS | 1 refills | Status: DC

## 2022-12-11 MED ORDER — ACETAMINOPHEN-CODEINE 300-30 MG PO TABS: 1.0000 | ORAL_TABLET | Freq: Four times a day (QID) | ORAL | 0 refills | Status: DC | PRN

## 2022-12-11 MED ORDER — HYDROCODONE-ACETAMINOPHEN 7.5-325 MG PO TABS: 1.0000 | ORAL_TABLET | Freq: Four times a day (QID) | ORAL | 0 refills | Status: DC | PRN

## 2022-12-11 NOTE — Assessment & Plan Note (Signed)
Chronic.  Uncontrolled.  Patient is intolerant of statins, zetia

## 2022-12-11 NOTE — Assessment & Plan Note (Signed)
Chronic.  Controlled.  Continue metoprolol 50 mg twice daily and hydrochlorothiazide 50 mg daily

## 2022-12-11 NOTE — Assessment & Plan Note (Signed)
Chronic.  Lumbar as well  seeing pain mgmt.  Occ T3 when pain severe.  Pdmp checked

## 2022-12-11 NOTE — Assessment & Plan Note (Addendum)
Chronic.  Uncontrolled with hyperglycemia.  She has not been compliant with diet.  Exercise is limited due to chronic back pain.  She has not been taking lunchtime insulin.  Does not want to go back to endocrinology.  Continue Novolin R 15 units bid ac and NPH 40bid.  Refer to PharmD for assist w/CGM,insulin mgmt.  Needs to get sugar controlled!  Will restart ozempic as well-take stool softener

## 2022-12-11 NOTE — Progress Notes (Signed)
   Care Guide Note  12/11/2022 Name: [name] MRN: [id number] DOB: March 06, 1950  Referred by: Jeani Sow, MD Reason for referral : Care Coordination (Outreach to schedule with pharm d )   [name] is a 72 y.o. year old female who is a primary care patient of Jeani Sow, MD. [name] was referred to the pharmacist for assistance related to DM.    Successful contact was made with the patient to discuss pharmacy services including being ready for the pharmacist to call at least 5 minutes before the scheduled appointment time, to have medication bottles and any blood sugar or blood pressure readings ready for review. The patient agreed to meet with the pharmacist via with the pharmacist via telephone visit on (date/time).  12/12/2022  Penne Lash, RMA Care Guide [facility]  [place], [place] [address] Direct Dial: [phone] [email or web address]

## 2022-12-11 NOTE — Patient Instructions (Addendum)
It was very nice to see you today!  Call GI [phone]  Pharm D will call to schedule   PLEASE NOTE:  If you had any lab tests please let us know if you have not heard back within a few days. You may see your results on MyChart before we have a chance to review them but we will give you a call once they are reviewed by [place]. If we ordered any referrals today, please let us know if you have not heard from their office within the next week.   Please try these tips to maintain a healthy lifestyle:  Eat most of your calories during the day when you are active. Eliminate processed foods including packaged sweets (pies, cakes, cookies), reduce intake of potatoes, white bread, white pasta, and white rice. Look for whole grain options, oat flour or almond flour.  Each meal should contain half fruits/vegetables, one quarter protein, and one quarter carbs (no bigger than a computer mouse).  Cut down on sweet beverages. This includes juice, soda, and sweet tea. Also watch fruit intake, though this is a healthier sweet option, it still contains natural sugar! Limit to 3 servings daily.  Drink at least 1 glass of water with each meal and aim for at least 8 glasses per day  Exercise at least 150 minutes every week.

## 2022-12-11 NOTE — Assessment & Plan Note (Signed)
Chronic.  Mostly manageable  occ has to take hydrocodone

## 2022-12-11 NOTE — Progress Notes (Signed)
Subjective:     Patient ID: [name], female    DOB: 03-08-1950, 72 y.o.   MRN: [id number]  Chief Complaint  Patient presents with   Medical Management of Chronic Issues    3 month follow-up Not fasting    HPI HTN - Pt is on metoprolol 50 mg BID and hydrochloroTHIAZIDE 25 mg BID. Bp's running not checked. Bp at today's visit was 138/70. No dizziness/cp/palp//sob. Endorses congestion in the mornings with Dayquil and albuterol providing relief.   Migraines. - Frequent migraines. On hydrocodone 7.5-325 mg PRN for pain every 6 hours.   Type 2 DM - Patient Is on Novolin R 12-15 units  BID and Novolin N 40 units BID. Sugars are not checked. Has needle phobia.  Aware sugar high in ER so pt increased to above.  Has cgm but nervous about deploying it and setting up phone.  Willing to see Pharm D.  Did well on ozempic.  But, put her in donut hole too fast  ok to restart.  New ins in Jan as well  Diverticulitis- Since last f/u in August, is still experiencing constipation and painful abdominal cramping. Visited the ED on 10/29 complaining of Abdominal pain and vomiting. Hx of recurring diverticulitis. ED diagnosis of diverticulitis. Reviewed ED notes.   Health Maintenance Due  Topic Date Due   Medicare Annual Wellness (AWV)  Never done    Past Medical History:  Diagnosis Date   Arthritis    C. difficile colitis    Diabetes mellitus    Diverticulitis    Fibromyalgia    Hypercholesteremia    Hypertension    IBS (irritable bowel syndrome)    Migraines    Nasal fracture    Stein-[place]hal ovaries     Past Surgical History:  Procedure Laterality Date   CHOLECYSTECTOMY  1998   KNEE ARTHROSCOPY Right 2017   KNEE ARTHROSCOPY Left    LEFT HEART CATHETERIZATION WITH CORONARY ANGIOGRAM N/A 12/19/2011   Procedure: LEFT HEART CATHETERIZATION WITH CORONARY ANGIOGRAM;  Surgeon: Donato Schultz, MD;  Location: [facility] CATH LAB;  Service: Cardiovascular;  Laterality: N/A;   ROTATOR CUFF REPAIR  Right    TONSILLECTOMY  1959     Current Outpatient Medications:    ACCU-CHEK SMARTVIEW test strip, Use 3x a day, Disp: 300 each, Rfl: 4   acetaminophen (TYLENOL) 650 MG CR tablet, 2 tablets as needed, Disp: , Rfl:    acetaminophen-codeine (TYLENOL #3) 300-30 MG tablet, Take 1 tablet by mouth every 6 (six) hours as needed for moderate pain (pain score 4-6)., Disp: 30 tablet, Rfl: 0   albuterol (VENTOLIN HFA) 108 (90 Base) MCG/ACT inhaler, Inhale 2 puffs into the lungs every 6 (six) hours as needed for wheezing or shortness of breath., Disp: 8 g, Rfl: 0   cholecalciferol (VITAMIN D) 1000 UNITS tablet, Take 1,000 Units by mouth every evening. , Disp: , Rfl:    Continuous Glucose Sensor (FREESTYLE LIBRE 3 PLUS SENSOR) MISC, 1 each by Does not apply route every 14 (fourteen) days., Disp: 2 each, Rfl: 11   hydrochlorothiazide (HYDRODIURIL) 50 MG tablet, Take 1/2 (one-half) tablet by mouth twice daily, Disp: 90 tablet, Rfl: 2   insulin NPH Human (NOVOLIN N) 100 UNIT/ML injection, Inject 0.3 mLs (30 Units total) into the skin 2 (two) times daily before a meal. (Patient taking differently: Inject 40 Units into the skin 2 (two) times daily before a meal.), Disp: 20 mL, Rfl: 0   insulin regular (NOVOLIN R) 100 units/mL injection,  Inject into the skin. 25 units before breakfast and dinner, 15-20 units before lunch, Disp: , Rfl:    Lancets (ACCU-CHEK MULTICLIX) lancets, Use as instructed to test 3x daily DX E11.65, Disp: 300 each, Rfl: 4   metoprolol tartrate (LOPRESSOR) 50 MG tablet, Take 0.5 tablets (25 mg total) by mouth 2 (two) times daily., Disp: 90 tablet, Rfl: 3   potassium chloride (KLOR-CON) 8 MEQ tablet, Take 8 mEq by mouth 2 (two) times daily., Disp: , Rfl:    predniSONE (STERAPRED UNI-PAK 48 TAB) 5 MG (48) TBPK tablet, Take 5 mg by mouth as directed., Disp: , Rfl:    PRESCRIPTION MEDICATION, Patient receives a Steroid injection in the neck once a year with Dr. Regino Schultze at [facility] pt is  unable to recall the exact name and i have been unable to verify name from the Dr's office. Patient also states " it shoots up my blood sugars really high, but i still get it because i can't take anything else for my neck pain", Disp: , Rfl:    Semaglutide,0.25 or 0.5MG /DOS, 2 MG/3ML SOPN, Inject 0.25 mg into the skin once a week for 28 days, THEN 0.5 mg once a week for 28 days., Disp: 3 mL, Rfl: 1   tiZANidine (ZANAFLEX) 4 MG tablet, Take 1 tablet (4 mg total) by mouth every 6 (six) hours as needed for muscle spasms., Disp: 30 tablet, Rfl: 3   HYDROcodone-acetaminophen (NORCO) 7.5-325 MG tablet, Take 1 tablet by mouth every 6 (six) hours as needed for moderate pain (pain score 4-6)., Disp: 20 tablet, Rfl: 0  Allergies  Allergen Reactions   Ceftriaxone Hives, Itching and Other (See Comments)   Dicyclomine Hives    Hives, itching, swelling, vomiting, felt like something was sitting on chest   Erythromycin Anaphylaxis and Other (See Comments)   Iodinated Contrast Media Hives, Other (See Comments) and Shortness Of Breath   Amoxicillin-Pot Clavulanate Nausea And Vomiting    Nausea, vomiting, headache, dizziness. Hives on 08/2019 Other reaction(s): hives, Other Nausea, vomiting, headache, dizziness   pt states she can take other Penicillins just not augmentin Nausea, vomiting, headache, dizziness. Hives on 08/2019    Canagliflozin Rash and Other (See Comments)    Dehydration Dehydration Dehydration    Statins Other (See Comments)    Severe muscle pain/weakness Muscle pain/ weekness    Sulfa Antibiotics Other (See Comments)    blackout PER PT SHE BLACKS OUT    Corticosteroids Other (See Comments)   Insulin Glargine Other (See Comments)    Severe ankle swelling   Metformin And Related Diarrhea and Nausea And Vomiting    Violently ill for 2 yrs   Metformin Hcl Other (See Comments)   Scallops [Shellfish Allergy] Diarrhea and Nausea And Vomiting    Severe GI upset   Tramadol Hcl Other  (See Comments)   Cipro [Ciprofloxacin Hcl] Rash    10/29-new rash   Nsaids Other (See Comments)    Stomach problems Stomach problems    Rsv Mrna Vaccine Rash    Left Swelling arm, hot, fatigue   Tramadol Rash   ROS neg/noncontributory except as noted HPI/below      Objective:     BP 138/70   Pulse 82   Temp 97.6 F (36.4 C) (Temporal)   Resp 16   Ht 5\' 5"  (1.651 m)   SpO2 97%   BMI 32.28 kg/m  Wt Readings from Last 3 Encounters:  12/03/22 194 lb (88 kg)  12/03/22 194 lb (88 kg)  08/08/21 188 lb (85.3 kg)    Physical Exam   Gen: WDWN NAD HEENT: NCAT, conjunctiva not injected, sclera nonicteric NECK:  supple, no thyromegaly, no nodes, no carotid bruits CARDIAC: RRR, S1S2+, no murmur. DP 2+B LUNGS: CTAB. No wheezes ABDOMEN:  BS+, soft, NTND, No HSM, no masses EXT:  chronic edema MSK: cane NEURO: A&O x3.  CN II-XII intact.  PSYCH: normal mood. Good eye contact     Assessment & Plan:  Type 2 diabetes mellitus with hyperglycemia, with long-term current use of insulin (HCC) Assessment & Plan: Chronic.  Uncontrolled with hyperglycemia.  She has not been compliant with diet.  Exercise is limited due to chronic back pain.  She has not been taking lunchtime insulin.  Does not want to go back to endocrinology.  Continue Novolin R 15 units bid ac and NPH 40bid.  Refer to PharmD for assist w/CGM,insulin mgmt.  Needs to get sugar controlled!  Will restart ozempic as well-take stool softener  Orders: -     AMB Referral [facility] Care Management -     Semaglutide(0.25 or 0.5MG /DOS); Inject 0.25 mg into the skin once a week for 28 days, THEN 0.5 mg once a week for 28 days.  Dispense: 3 mL; Refill: 1  Essential hypertension Assessment & Plan: Chronic.  Controlled.  Continue metoprolol 50 mg twice daily and hydrochlorothiazide 50 mg daily   Pure hypercholesterolemia Assessment & Plan: Chronic.  Uncontrolled.  Patient is intolerant of statins, zetia   Long-term insulin use  (HCC)  Statin intolerance  Migraine without aura and without status migrainosus, not intractable Assessment & Plan: Chronic.  Mostly manageable  occ has to take hydrocodone  Orders: -     HYDROcodone-Acetaminophen; Take 1 tablet by mouth every 6 (six) hours as needed for moderate pain (pain score 4-6).  Dispense: 20 tablet; Refill: 0  Drug-induced myopathy  Cervical radiculopathy Assessment & Plan: Chronic.  Lumbar as well  seeing pain mgmt.  Occ T3 when pain severe.  Pdmp checked  Orders: -     Acetaminophen-Codeine; Take 1 tablet by mouth every 6 (six) hours as needed for moderate pain (pain score 4-6).  Dispense: 30 tablet; Refill: 0    Return in about 3 months (around 03/13/2023) for DM, HTN.  I,Anaiya N Rice,acting as a scribe for Angelena Sole, MD.,have documented all relevant documentation on the behalf of Angelena Sole, MD,as directed by  Angelena Sole, MD while in the presence of Angelena Sole, MD.  I, Angelena Sole, MD, have reviewed all documentation for this visit. The documentation on 12/11/22 for the exam, diagnosis, procedures, and orders are all accurate and complete.   Angelena Sole, MD

## 2022-12-12 ENCOUNTER — Other Ambulatory Visit: Payer: Medicare HMO | Admitting: Pharmacist

## 2022-12-12 NOTE — Progress Notes (Signed)
12/12/2022 Name: [name] MRN: [id number] DOB: [birth year]  Chief Complaint  Patient presents with   Diabetes   Medication Management    [name] is a 72 y.o. year old female who presented for a telephone visit.   They were referred to the pharmacist by their PCP for assistance in managing diabetes, medication access, and complex medication management.    Subjective:   Medication Access/Adherence  Current Pharmacy:  [facility] [address] [place] - [address] [place] [address] Phone: [phone] Fax: [phone]  [facility] [place] [facility] [address] [place] [address] Phone: [phone] Fax: [phone]   Patient reports affordability concerns with their medications:  cost of Ozempic is high when she reaches coverage gap but anticipates she will not reach coverage gap in 2024.  Patient reports access/transportation concerns to their pharmacy: No  Patient reports adherence concerns with their medications:  Yes  doesn't check blood glucose much because she is needle phobic    Diabetes:  Current medications: Novolin N 40 units twice a day and Novolin R 12 to 15 units twice a day. Ozempic 0.25mg  weekly prescribed yesterday but has not taken first dose yet.    She has taken Ozempic in past but stopped due to cost. She thinks she got up to 0.5mg  weekly dose. She report positive effects on blood glucose but did feel that she lost much weight in the past.   Current glucose readings: none to report today   Current medication access support: none   Objective:  Lab Results  Component Value Date   HGBA1C 9.5 (H) 09/10/2022    Lab Results  Component Value Date   CREATININE 0.95 12/03/2022   BUN 22 12/03/2022   NA 135 12/03/2022   K 3.8 12/03/2022   CL 94 (L) 12/03/2022   CO2 29 12/03/2022    Lab Results  Component Value Date   CHOL 204 (H) 09/10/2022    HDL 52.70 09/10/2022   LDLCALC 121 (H) 09/10/2022   TRIG 153.0 (H) 09/10/2022   CHOLHDL 4 09/10/2022    Medications Reviewed Today     Reviewed by Henrene Pastor, RPH-CPP (Pharmacist) on 12/12/22 at 1335  Med List Status: <None>   Medication Order Taking? Sig Documenting Provider Last Dose Status Informant  ACCU-CHEK SMARTVIEW test strip [id number]  Use 3x a day Carlus Pavlov, MD  Active   acetaminophen (TYLENOL) 650 MG CR tablet [id number]  2 tablets as needed [provider]  Active   acetaminophen-codeine (TYLENOL #3) 300-30 MG tablet [id number]  Take 1 tablet by mouth every 6 (six) hours as needed for moderate pain (pain score 4-6). Jeani Sow, MD  Active   albuterol (VENTOLIN HFA) 108 [phone] Base) MCG/ACT inhaler [id number]  Inhale 2 puffs into the lungs every 6 (six) hours as needed for wheezing or shortness of breath. Jeani Sow, MD  Active   cholecalciferol (VITAMIN D) 1000 UNITS tablet [id number]  Take 1,000 Units by mouth every evening.  [provider]  Active Self  Continuous Glucose Sensor (FREESTYLE LIBRE 3 PLUS SENSOR) MISC [id number] No 1 each by Does not apply route every 14 (fourteen) days.  Patient not taking: Reported on 12/12/2022   Jeani Sow, MD Not Taking Active    Patient not taking:   Discontinued 07/06/19 1931          Med Note (CONGER, MELISSA I   Thu Oct 16, 2017  3:00 PM)  One a day per patient  hydrochlorothiazide (HYDRODIURIL) 50 MG tablet [id number]  Take 1/2 (one-half) tablet by mouth twice daily Jeani Sow, MD  Active   HYDROcodone-acetaminophen [facility]) 7.5-325 MG tablet [id number]  Take 1 tablet by mouth every 6 (six) hours as needed for moderate pain (pain score 4-6). Jeani Sow, MD  Active   insulin NPH Human (NOVOLIN N) 100 UNIT/ML injection [id number]  Inject 0.3 mLs (30 Units total) into the skin 2 (two) times daily before a meal.  Patient taking differently: Inject 40 Units into the skin 2 (two) times daily before a  meal.   Carlus Pavlov, MD  Active   insulin regular (NOVOLIN R) 100 units/mL injection [id number]  Inject into the skin. 25 units before breakfast and dinner, 15-20 units before lunch [provider]  Active   Lancets (ACCU-CHEK MULTICLIX) lancets [id number]  Use as instructed to test 3x daily DX E11.65 Carlus Pavlov, MD  Active   metoprolol tartrate (LOPRESSOR) 50 MG tablet [id number]  Take 0.5 tablets (25 mg total) by mouth 2 (two) times daily. Jeani Sow, MD  Active   potassium chloride (KLOR-CON) 8 MEQ tablet [id number]  Take 8 mEq by mouth 2 (two) times daily. [provider]  Active   predniSONE (STERAPRED UNI-PAK 48 TAB) 5 MG (48) TBPK tablet [id number]  Take 5 mg by mouth as directed. [provider]  Active   PRESCRIPTION MEDICATION [id number]  Patient receives a Steroid injection in the neck once a year with Dr. Regino Schultze at [facility] pt is unable to recall the exact name and i have been unable to verify name from the Dr's office. Patient also states " it shoots up my blood sugars really high, but i still get it because i can't take anything else for my neck pain" [provider]  Active Self           Med Note [name] Aug 02, 2019  3:08 PM)    Semaglutide,0.25 or 0.5MG /DOS, 2 MG/3ML Namon Cirri [id number] No Inject 0.25 mg into the skin once a week for 28 days, THEN 0.5 mg once a week for 28 days.  Patient not taking: Reported on 12/12/2022   Jeani Sow, MD Not Taking Active   tiZANidine (ZANAFLEX) 4 MG tablet [id number]  Take 1 tablet (4 mg total) by mouth every 6 (six) hours as needed for muscle spasms. Jeani Sow, MD  Active               Assessment/Plan:   Diabetes: - Currently uncontrolled - made face to face appointment with patient for tomorrow 11/8 for education and new Continuous Glucose Monitor start - Libre 3. - Will also discussed insulin adjust since she is currently taking prednisone taper. For  now will continue 40 units twice a day and Novolin R 12 to 15 units twice a day.  - Recommend to start Ozempic 0.25mg  weekly for 4 weeks, then increase to 0.5mg  weekly thereafter. Encouraged her to take first dose today but if she does not, we can do that tomorrow when she is in office - Discussed 2025 Medicare changes. Patient will have [employer] plan in 2025. She reports it has about a $200 deductible which is managable for her. Then cost of brand meds will be $47 until she reaches $2000 out of pocket max - after that cost will be $0.    Henrene Pastor, PharmD Clinical Pharmacist [facility]  Population Health [phone]

## 2022-12-13 ENCOUNTER — Other Ambulatory Visit: Payer: Medicare HMO | Admitting: Pharmacist

## 2022-12-13 DIAGNOSIS — E782 Mixed hyperlipidemia: Secondary | ICD-10-CM

## 2022-12-13 DIAGNOSIS — Z789 Other specified health status: Secondary | ICD-10-CM

## 2022-12-13 DIAGNOSIS — Z794 Long term (current) use of insulin: Secondary | ICD-10-CM

## 2022-12-13 DIAGNOSIS — T466X5A Adverse effect of antihyperlipidemic and antiarteriosclerotic drugs, initial encounter: Secondary | ICD-10-CM

## 2022-12-13 NOTE — Progress Notes (Signed)
12/13/2022 Name: [name] MRN: [id number] DOB: 02-14-1950  Chief Complaint  Patient presents with   Diabetes    [name] is a 72 y.o. year old female who presented for a telephone visit.   They were referred to the pharmacist by their PCP for assistance in managing diabetes, medication access, and complex medication management.    Subjective:  Patient is in today to start Liber Continuous Glucose Monitor sensor. She has had sensor for a few months but was afraid to start using it and wanted assistance with setting up the app on her phone.   Patient reports affordability concerns with their medications:  cost of Ozempic is high when she reaches coverage gap but anticipates she will not reach coverage gap in 2024.  Patient reports access/transportation concerns to their pharmacy: No  Patient reports adherence concerns with their medications:  Yes  doesn't check blood glucose much because she is needle phobic    Diabetes:  Current medications: Novolin N 40 units twice a day and Novolin R 12 to 15 units twice a day. Ozempic 0.25mg  weekly prescribed yesterday - took first dose today.    She has taken Ozempic in past but stopped due to cost. She thinks she got up to 0.5mg  weekly dose. She report positive effects on blood glucose but did feel that she lost much weight in the past.   Current glucose readings: none to report today  Current medication access support: none  Hyperlipidemia:   Current therapy - none Past therapies - patient has tried lovastatin, rosuvastatin and atorvastatin in past. Patient reports muscle pain and weakness with all 3 statins.  Ezetimibe - patient stopped on own - removed from med list during ED visit 12/03/2022     Objective:  Lab Results  Component Value Date   HGBA1C 9.5 (H) 09/10/2022    Lab Results  Component Value Date   CREATININE 0.95 12/03/2022   BUN 22 12/03/2022   NA 135 12/03/2022   K 3.8 12/03/2022   CL 94 (L)  12/03/2022   CO2 29 12/03/2022    Lab Results  Component Value Date   CHOL 204 (H) 09/10/2022   HDL 52.70 09/10/2022   LDLCALC 121 (H) 09/10/2022   TRIG 153.0 (H) 09/10/2022   CHOLHDL 4 09/10/2022    Medications Reviewed Today     Reviewed by Henrene Pastor, RPH-CPP (Pharmacist) on 12/13/22 at 1254  Med List Status: <None>   Medication Order Taking? Sig Documenting Provider Last Dose Status Informant  ACCU-CHEK SMARTVIEW test strip [id number] No Use 3x a day  Patient not taking: Reported on 12/13/2022   Carlus Pavlov, MD Not Taking Active   acetaminophen (TYLENOL) 650 MG CR tablet [id number] Yes Take 650 mg by mouth every 8 (eight) hours as needed for pain. [provider] Taking Active   acetaminophen-codeine (TYLENOL #3) 300-30 MG tablet [id number] Yes Take 1 tablet by mouth every 6 (six) hours as needed for moderate pain (pain score 4-6). Jeani Sow, MD Taking Active   albuterol (VENTOLIN HFA) 108 [phone] Base) MCG/ACT inhaler [id number]  Inhale 2 puffs into the lungs every 6 (six) hours as needed for wheezing or shortness of breath. Jeani Sow, MD  Active   cholecalciferol (VITAMIN D) 1000 UNITS tablet [id number] Yes Take 1,000 Units by mouth every evening.  [provider] Taking Active Self  Continuous Glucose Sensor (FREESTYLE LIBRE 3 PLUS SENSOR) MISC [id number] Yes 1 each by Does not apply route every 14 (  fourteen) days. Jeani Sow, MD Taking Active   Patient not taking:  Discontinued 07/06/19 1931          Med Note (CONGER, MELISSA I   Thu Oct 16, 2017  3:00 PM) One a day per patient  hydrochlorothiazide (HYDRODIURIL) 50 MG tablet [id number] Yes Take 1/2 (one-half) tablet by mouth twice daily Jeani Sow, MD Taking Active   HYDROcodone-acetaminophen [facility]) 7.5-325 MG tablet [id number] Yes Take 1 tablet by mouth every 6 (six) hours as needed for moderate pain (pain score 4-6). Jeani Sow, MD Taking Active   insulin NPH Human (NOVOLIN N) 100  UNIT/ML injection [id number]  Inject 0.3 mLs (30 Units total) into the skin 2 (two) times daily before a meal.  Patient taking differently: Inject 40 Units into the skin 2 (two) times daily before a meal.   Carlus Pavlov, MD  Active   insulin regular (NOVOLIN R) 100 units/mL injection [id number]  Inject 15 Units into the skin 3 (three) times daily before meals. [provider]  Active   Lancets (ACCU-CHEK MULTICLIX) lancets [id number] No Use as instructed to test 3x daily DX E11.65  Patient not taking: Reported on 12/13/2022   Carlus Pavlov, MD Not Taking Active   metoprolol tartrate (LOPRESSOR) 50 MG tablet [id number] Yes Take 0.5 tablets (25 mg total) by mouth 2 (two) times daily. Jeani Sow, MD Taking Active   potassium chloride (KLOR-CON) 8 MEQ tablet [id number] Yes Take 8 mEq by mouth 2 (two) times daily. [provider] Taking Active   predniSONE (STERAPRED UNI-PAK 48 TAB) 5 MG (48) TBPK tablet [id number] Yes Take 5 mg by mouth as directed. [provider] Taking Active   PRESCRIPTION MEDICATION [id number]  Patient receives a Steroid injection in the neck once a year with Dr. Regino Schultze at [facility] pt is unable to recall the exact name and i have been unable to verify name from the Dr's office. Patient also states " it shoots up my blood sugars really high, but i still get it because i can't take anything else for my neck pain" [provider]  Active Self           Med Note [name] Aug 02, 2019  3:08 PM)    Semaglutide,0.25 or 0.5MG /DOS, 2 MG/3ML Namon Cirri [id number] Yes Inject 0.25 mg into the skin once a week for 28 days, THEN 0.5 mg once a week for 28 days. Jeani Sow, MD Taking Active   tiZANidine (ZANAFLEX) 4 MG tablet [id number] Yes Take 1 tablet (4 mg total) by mouth every 6 (six) hours as needed for muscle spasms.  Patient taking differently: Take 2-4 mg by mouth every 6 (six) hours as needed for muscle spasms.   Jeani Sow, MD Taking Active               Assessment/Plan:   Diabetes: - Currently uncontrolled - Patient received the following instruction for Freestyle Libre 3 Plus Personal CGM:   - preparation of placement site - clean with alcohol and allow to dry.  Sensor is to only be place on back of upper arm.  Patient to rotate sides and site.  Observed patient place sensor on left arm.   - care of sensor and site   - reminded that sensor is waterproof up to 3 feet and for 30 minutes.   - Assisted in downloading [place] 3 app to her phone and with app / account set up.   -  reviewed Libre 3 app home screen and how to read and respond to trend arrows.   - reminded that when magnifying glass symbols shows up she is to confirm BG with finger stick before making any treatment decisions. .   - pt signed up for libre view in office. Linked with PCP office.   - Continue 40 units twice a day and Novolin R 12 to 15 units twice a day. She has just started prednisone taper and may need additional insulin over the next few days.  - Continue Ozempic 0.25mg  weekly for 4 weeks, then increase to 0.5mg  weekly thereafter.  - Discussed 2025 Medicare changes. Patient will have [employer] plan in 2025. She reports it has about a $200 deductible which is managable for her. Then cost of brand meds will be $47 until she reaches $2000 out of pocket max - after that cost will be $0.  Provided medication assistance program application for Novo Nordisk if she decides she needs in 2025.   Hyperlipidemia: LDL and Tg not at goal; patient is intolerant to statins and ezetimibe.  Limit intake of saturated fat. Follow Mediterrean type diet.  Recheck lipids in 2 to 3 months - consider Repatha or Nexletol if LDL still > 100  Follow up in 1 week to review blood glucose / Continuous Glucose Monitor report and adjust meds as needed.    Henrene Pastor, PharmD Clinical Pharmacist [facility] Primary Care  Population  Health [phone]

## 2022-12-17 ENCOUNTER — Other Ambulatory Visit: Payer: Medicare HMO | Admitting: Pharmacist

## 2022-12-17 NOTE — Progress Notes (Signed)
12/17/2022 Name: [name] MRN: [id number] DOB: [birth year]  Chief Complaint  Patient presents with   Diabetes    [name] is a 72 y.o. year old female who presented for a telephone visit.   They were referred to the pharmacist by their PCP for assistance in managing diabetes, medication access, and complex medication management.    Subjective:  Diabetes:  Current medications: Novolin N 40 units twice a day and Novolin R 12 to 15 units twice a day; Ozempic 0.25mg  weekly   Patient took first dose of Ozempic 0.25mg  on Friday 12/13/2022. We also started her first [place] 3 sensor on Friday 11/8.  Unfortunately patient also started oral prednisone taper last weekk and blood glucose has been very elevated but today was her last prednisone dose (only 10mg  today) and blood glucose seems to be improving.  She denies nausea since starting Ozempic.   She has taken Ozempic in past but stopped due to cost. She thinks she got up to 0.5mg  weekly dose. She report positive effects on blood glucose but did feel that she lost much weight in the past.    Patient reports affordability concerns with their medications:  cost of Ozempic is high when she reaches coverage gap but anticipates she will not reach coverage gap in 2024, since Ozmepic was started so late in the year.  Patient reports access/transportation concerns to their pharmacy: No  Patient reports adherence concerns with their medications:  Yes  doesn't check blood glucose much because she is needle phobic   Date of Download: 12/17/2022 % Time CGM is active: 28% (just started 12/13/2022) Average Glucose: 317 mg/dL Glucose Management Indicator: n/a  Glucose Variability: 22.8 (goal <36%) Time in Goal:  - Time in range 70-180: 7% - Time above range: 93% - Time below range: 0% Observed patterns: Blood glucose very elevated due to oral prednisone. Took lowest / last dose today and blood glucose seems to already be improving.        Current medication access support: none. At previous visit, discussed 2025 Medicare changes. Patient will have [employer] plan in 2025. She reports it has about a $200 deductible which is managable for her. Then cost of brand meds will be $47 until she reaches $2000 out of pocket max - after that cost will be $0.  Provided medication assistance program application on 12/13/2022 for Novo Nordisk if she decides she needs in 2025.   Hyperlipidemia:   Current therapy - none Past therapies - patient has tried lovastatin, rosuvastatin and atorvastatin in past. Patient reports muscle pain and weakness with all 3 statins.  Ezetimibe - patient stopped on own - removed from med list during ED visit 12/03/2022     Objective:  Lab Results  Component Value Date   HGBA1C 9.5 (H) 09/10/2022    Lab Results  Component Value Date   CREATININE 0.95 12/03/2022   BUN 22 12/03/2022   NA 135 12/03/2022   K 3.8 12/03/2022   CL 94 (L) 12/03/2022   CO2 29 12/03/2022    Lab Results  Component Value Date   CHOL 204 (H) 09/10/2022   HDL 52.70 09/10/2022   LDLCALC 121 (H) 09/10/2022   TRIG 153.0 (H) 09/10/2022   CHOLHDL 4 09/10/2022    Medications Reviewed Today     Reviewed by Henrene Pastor, RPH-CPP (Pharmacist) on 12/17/22 at 1111  Med List Status: <None>   Medication Order Taking? Sig Documenting Provider Last Dose Status Informant  ACCU-CHEK SMARTVIEW test  strip [id number] Yes Use 3x a day Carlus Pavlov, MD Taking Active   acetaminophen (TYLENOL) 650 MG CR tablet [id number] Yes Take 650 mg by mouth every 8 (eight) hours as needed for pain. [provider] Taking Active   acetaminophen-codeine (TYLENOL #3) 300-30 MG tablet [id number] Yes Take 1 tablet by mouth every 6 (six) hours as needed for moderate pain (pain score 4-6). Jeani Sow, MD Taking Active   albuterol (VENTOLIN HFA) 108 [phone] Base) MCG/ACT inhaler [id number] Yes Inhale 2 puffs into the lungs every 6 (six) hours as  needed for wheezing or shortness of breath. Jeani Sow, MD Taking Active   cholecalciferol (VITAMIN D) 1000 UNITS tablet [id number] Yes Take 1,000 Units by mouth every evening.  [provider] Taking Active Self  Continuous Glucose Sensor (FREESTYLE LIBRE 3 PLUS SENSOR) MISC [id number] Yes 1 each by Does not apply route every 14 (fourteen) days. Jeani Sow, MD Taking Active    Patient not taking:   Discontinued 07/06/19 1931          Med Note (CONGER, MELISSA I   Thu Oct 16, 2017  3:00 PM) One a day per patient  hydrochlorothiazide (HYDRODIURIL) 50 MG tablet [id number] Yes Take 1/2 (one-half) tablet by mouth twice daily Jeani Sow, MD Taking Active   HYDROcodone-acetaminophen [facility]) 7.5-325 MG tablet [id number] Yes Take 1 tablet by mouth every 6 (six) hours as needed for moderate pain (pain score 4-6). Jeani Sow, MD Taking Active   insulin NPH Human (NOVOLIN N) 100 UNIT/ML injection [id number] Yes Inject 0.3 mLs (30 Units total) into the skin 2 (two) times daily before a meal.  Patient taking differently: Inject 40 Units into the skin 2 (two) times daily before a meal.   Carlus Pavlov, MD Taking Active   insulin regular (NOVOLIN R) 100 units/mL injection [id number] Yes Inject 15 Units into the skin 3 (three) times daily before meals. [provider] Taking Active   Lancets (ACCU-CHEK MULTICLIX) lancets [id number] Yes Use as instructed to test 3x daily DX E11.65 Carlus Pavlov, MD Taking Active   metoprolol tartrate (LOPRESSOR) 50 MG tablet [id number] Yes Take 0.5 tablets (25 mg total) by mouth 2 (two) times daily. Jeani Sow, MD Taking Active   potassium chloride (KLOR-CON) 8 MEQ tablet [id number] Yes Take 8 mEq by mouth 2 (two) times daily. [provider] Taking Active   predniSONE (STERAPRED UNI-PAK 48 TAB) 5 MG (48) TBPK tablet [id number] Yes Take 5 mg by mouth as directed. [provider] Taking Active   PRESCRIPTION MEDICATION  [id number] Yes Patient receives a Steroid injection in the neck once a year with Dr. Regino Schultze at [facility] pt is unable to recall the exact name and i have been unable to verify name from the Dr's office. Patient also states " it shoots up my blood sugars really high, but i still get it because i can't take anything else for my neck pain" [provider] Taking Active Self           Med Note [name] Aug 02, 2019  3:08 PM)    Semaglutide,0.25 or 0.5MG /DOS, 2 MG/3ML Namon Cirri [id number] Yes Inject 0.25 mg into the skin once a week for 28 days, THEN 0.5 mg once a week for 28 days. Jeani Sow, MD Taking Active   tiZANidine (ZANAFLEX) 4 MG tablet [id number] Yes Take 1 tablet (4 mg total) by mouth every 6 (six) hours as needed for muscle  spasms.  Patient taking differently: Take 2-4 mg by mouth every 6 (six) hours as needed for muscle spasms.   Jeani Sow, MD Taking Active               Assessment/Plan:   Diabetes: - Currently uncontrolled - has just completed 5 day oral steroid taper.  - Continue 40 units twice a day and Novolin R 12 to 15 units twice a day. Will continue to monitor Continuous Glucose report remotely and adjust insulin if blood glucose does not continue to improve now that she hhas completed orla prednisone therapy.  - Continue Ozempic 0.25mg  weekly for 3 more doses, then increase to 0.5mg  weekly thereafter.   Hyperlipidemia: LDL and Tg not at goal; patient is intolerant to statins and ezetimibe.  Limit intake of saturated fat. Follow Mediterrean type diet.  Recheck lipids in 2 to 3 months - consider Repatha or Nexletol if LDL still > 100  Follow up in 1 to 2 weeks to review blood glucose / Continuous Glucose Monitor report and adjust meds as needed.    Henrene Pastor, PharmD Clinical Pharmacist [facility] Primary Care  Population Health [phone]

## 2022-12-26 ENCOUNTER — Other Ambulatory Visit: Payer: Self-pay | Admitting: Pharmacist

## 2022-12-26 DIAGNOSIS — Z789 Other specified health status: Secondary | ICD-10-CM

## 2022-12-26 DIAGNOSIS — E1165 Type 2 diabetes mellitus with hyperglycemia: Secondary | ICD-10-CM

## 2022-12-26 DIAGNOSIS — T466X5A Adverse effect of antihyperlipidemic and antiarteriosclerotic drugs, initial encounter: Secondary | ICD-10-CM

## 2022-12-26 DIAGNOSIS — E782 Mixed hyperlipidemia: Secondary | ICD-10-CM

## 2022-12-26 NOTE — Progress Notes (Signed)
12/26/2022 Name: [name] MRN: [id number] DOB: 1950/06/11  Chief Complaint  Patient presents with   Diabetes   Hypertension    [name] is a 72 y.o. year old female who presented for a telephone visit.   They were referred to the pharmacist by their PCP for assistance in managing diabetes, medication access, and complex medication management.    Subjective:  Diabetes:  Current medications: Novolin N 30 units twice a day and Novolin R 12 to 15 units twice a day; Ozempic 0.25mg  weekly .    Patient took first dose of Ozempic 0.25mg  on Friday 12/13/2022. We also started her first [place] 3 sensor on Friday 11/8.  Unfortunately patient completed course of oral prednisone last week and blood glucose had been very elevated but that last 4 to 5 days has improved.    She denies nausea since starting Ozempic but she has had a little constipation.   She has taken Ozempic in past but stopped due to cost. She thinks she got up to 0.5mg  weekly dose. She report positive effects on blood glucose but did not feel that she lost much weight in the past.    Patient reports affordability concerns with their medications:  cost of Ozempic is high when she reaches coverage gap but anticipates she will not reach coverage gap in 2024, since Ozmepic was started so late in the year.   We discussed applying for Novo medication assistance program in past but patient preferred to wait to see what cost of Ozempic will be in 2025.  Patient reports access/transportation concerns to their pharmacy: No  Patient reports adherence concerns with their medications:  Yes  didn't check blood glucose much by fingerstick because she is needle phobic   Date of Download: 12/26/2022 % Time CGM is active: 94% (just started 12/13/2022) Average Glucose: 236 mg/dL(was 295 last week)  Glucose Management Indicator: 9.0%  Glucose Variability: 38.5%(goal <36%) Time in Goal:  - Time in range 70-180: 29% (last week  7%)% - Time above range: 71% (Last week was 93%) - Time below range: 0%  Observed patterns: Blood glucose improving with addition of Ozempic and since patient has been off prednisone.         Current medication access support: none. At previous visit, discussed 2025 Medicare changes. Patient will have [employer] plan in 2025. She reports it has about a $200 deductible which is managable for her. Then cost of brand meds will be $47 until she reaches $2000 out of pocket max - after that cost will be $0.  Provided medication assistance program application on 12/13/2022 for Novo Nordisk if she decides she needs in 2025.   Hyperlipidemia:   Current therapy - none Past therapies - patient has tried lovastatin, rosuvastatin and atorvastatin in past. Patient reports muscle pain and weakness with all 3 statins.  Ezetimibe - also caused muscle aches and pains; patient stopped on own - removed from med list during ED visit 12/03/2022.    Objective:  Lab Results  Component Value Date   HGBA1C 9.5 (H) 09/10/2022    Lab Results  Component Value Date   CREATININE 0.95 12/03/2022   BUN 22 12/03/2022   NA 135 12/03/2022   K 3.8 12/03/2022   CL 94 (L) 12/03/2022   CO2 29 12/03/2022    Lab Results  Component Value Date   CHOL 204 (H) 09/10/2022   HDL 52.70 09/10/2022   LDLCALC 121 (H) 09/10/2022   TRIG 153.0 (H) 09/10/2022  CHOLHDL 4 09/10/2022    Medications Reviewed Today     Reviewed by Henrene Pastor, RPH-CPP (Pharmacist) on 12/26/22 at 1457  Med List Status: <None>   Medication Order Taking? Sig Documenting Provider Last Dose Status Informant  ACCU-CHEK SMARTVIEW test strip [id number] No Use 3x a day Carlus Pavlov, MD Taking Active   acetaminophen (TYLENOL) 650 MG CR tablet [id number] No Take 650 mg by mouth every 8 (eight) hours as needed for pain. [provider] Taking Active   acetaminophen-codeine (TYLENOL #3) 300-30 MG tablet [id number] No Take 1 tablet by mouth  every 6 (six) hours as needed for moderate pain (pain score 4-6). Jeani Sow, MD Taking Active   albuterol (VENTOLIN HFA) 108 [phone] Base) MCG/ACT inhaler [id number] No Inhale 2 puffs into the lungs every 6 (six) hours as needed for wheezing or shortness of breath. Jeani Sow, MD Taking Active   cholecalciferol (VITAMIN D) 1000 UNITS tablet [id number] No Take 1,000 Units by mouth every evening.  [provider] Taking Active Self  Continuous Glucose Sensor (FREESTYLE LIBRE 3 PLUS SENSOR) MISC [id number] No 1 each by Does not apply route every 14 (fourteen) days. Jeani Sow, MD Taking Active   Patient not taking:  Discontinued 07/06/19 1931          Med Note (CONGER, MELISSA I   Thu Oct 16, 2017  3:00 PM) One a day per patient  hydrochlorothiazide (HYDRODIURIL) 50 MG tablet [id number] No Take 1/2 (one-half) tablet by mouth twice daily Jeani Sow, MD Taking Active   HYDROcodone-acetaminophen [facility]) 7.5-325 MG tablet [id number] No Take 1 tablet by mouth every 6 (six) hours as needed for moderate pain (pain score 4-6). Jeani Sow, MD Taking Active   insulin NPH Human (NOVOLIN N) 100 UNIT/ML injection [id number] No Inject 0.3 mLs (30 Units total) into the skin 2 (two) times daily before a meal. Carlus Pavlov, MD Taking Active   insulin regular (NOVOLIN R) 100 units/mL injection [id number] No Inject 15 Units into the skin 3 (three) times daily before meals. [provider] Taking Active   Lancets (ACCU-CHEK MULTICLIX) lancets [id number] No Use as instructed to test 3x daily DX E11.65 Carlus Pavlov, MD Taking Active   metoprolol tartrate (LOPRESSOR) 50 MG tablet [id number] No Take 0.5 tablets (25 mg total) by mouth 2 (two) times daily. Jeani Sow, MD Taking Active   potassium chloride (KLOR-CON) 8 MEQ tablet [id number] No Take 8 mEq by mouth 2 (two) times daily. [provider] Taking Active   predniSONE (STERAPRED UNI-PAK 48 TAB) 5 MG (48) TBPK tablet  [id number] No Take 5 mg by mouth as directed. [provider] Taking Active   PRESCRIPTION MEDICATION [id number] No Patient receives a Steroid injection in the neck once a year with Dr. Regino Schultze at [facility] pt is unable to recall the exact name and i have been unable to verify name from the Dr's office. Patient also states " it shoots up my blood sugars really high, but i still get it because i can't take anything else for my neck pain" [provider] Taking Active Self           Med Note [name] Aug 02, 2019  3:08 PM)    Semaglutide,0.25 or 0.5MG /DOS, 2 MG/3ML Namon Cirri [id number] No Inject 0.25 mg into the skin once a week for 28 days, THEN 0.5 mg once a week for 28 days. Jeani Sow, MD Taking Active  tiZANidine (ZANAFLEX) 4 MG tablet [id number] No Take 1 tablet (4 mg total) by mouth every 6 (six) hours as needed for muscle spasms.  Patient taking differently: Take 2-4 mg by mouth every 6 (six) hours as needed for muscle spasms.   Jeani Sow, MD Taking Active              Assessment/Plan:   Diabetes: - Currently uncontrolled - has just completed 5 day oral steroid taper.  - Discussed checking A1c again since last was in August - pt would prefer to wait until February 2025 when she sees Dr Ruthine Dose.  - Continue 40 units twice a day and Novolin R 12 to 15 units twice a day. Will continue to monitor Continuous Glucose report remotely and adjust insulin if blood glucose does not continue to improve now that she hhas completed orla prednisone therapy.  - Continue Ozempic 0.25mg  weekly for 3 more doses, then increase to 0.5mg  weekly thereafter.   Hyperlipidemia: LDL and Tg not at goal; patient is intolerant to statins and ezetimibe.  Limit intake of saturated fat. Follow Mediterrean type diet.  Patient prefers not to retry statin therapy due to past side effect of myalgias.  Recheck lipids in 2 to 3 months - consider Repatha or Nexletol if LDL  still > 100  Follow up in 1 to 2 weeks to review blood glucose / Continuous Glucose Monitor report and adjust meds as needed.    Henrene Pastor, PharmD Clinical Pharmacist [facility]  Population Health [phone]

## 2022-12-30 ENCOUNTER — Encounter: Payer: Self-pay | Admitting: Family

## 2022-12-30 ENCOUNTER — Ambulatory Visit (INDEPENDENT_AMBULATORY_CARE_PROVIDER_SITE_OTHER): Payer: Medicare HMO | Admitting: Family

## 2022-12-30 ENCOUNTER — Telehealth: Payer: Self-pay | Admitting: Family Medicine

## 2022-12-30 VITALS — BP 135/77 | HR 90 | Temp 98.0°F | Ht 65.0 in

## 2022-12-30 DIAGNOSIS — K5792 Diverticulitis of intestine, part unspecified, without perforation or abscess without bleeding: Secondary | ICD-10-CM | POA: Diagnosis not present

## 2022-12-30 MED ORDER — AMOXICILLIN-POT CLAVULANATE 875-125 MG PO TABS: 1.0000 | ORAL_TABLET | Freq: Two times a day (BID) | ORAL | 0 refills | Status: DC

## 2022-12-30 NOTE — Progress Notes (Signed)
Patient ID: [name], female    DOB: 1950/05/24, 72 y.o.   MRN: [id number]  Chief Complaint  Patient presents with   Diverticulitis    Pt c/o diverticulitis spasms, worsening symptoms and nausea.    Discussed the use of AI scribe software for clinical note transcription with the patient, who gave verbal consent to proceed.  History of Present Illness   The patient, with a history of diverticulitis, presents with recurrent abdominal pain and constipation. She was seen in the office a month ago for same sx and given Flagyl, but her sx worsened &  reports going to the ER 2w later where she was diagnosed with a mildly inflamed diverticulum and started on Cipro, which was quickly discontinued due to an allergic reaction. She was then switched to Augmentin, which she tolerated well and led to an initial improvement in symptoms. However, she subsequently developed severe constipation, leading to intense abdominal spasms, nausea, and vomiting. She attempted to self-manage with magnesium citrate, which provided some relief but also triggered severe indigestion and muscle spasms. She reports that the pain is so intense it causes instant nausea and vomiting. She has a scheduled appointment with gastroenterology in mid-February, but the severity of her symptoms has led her to seek an earlier appointment. She also mentions starting Ozempic during this period and is considering skipping her next dose to see if it alleviates her symptoms.     Assessment & Plan:     Abdominal Pain - Severe abdominal pain with spasms causing nausea and vomiting. Pt visibly uncomfortable in room. Recent ER visit showed mild diverticulitis treated with Augmentin. Constipation may be contributing to symptoms. -Start another round of Augmentin. -Start MiraLax once daily after nausea subsides, can increase to twice daily if needed to ensure a daily soft BM. -Advised pt to contact PCP & see if can contact gastroenterologist for  earlier appointment due to recurrent episodes.  Constipation - Chronic issue, exacerbated by recent diverticulitis and possibly by Ozempic. -Consider holding Ozempic this week to give body a break. -Drink at least 2L water daily. -Start MiraLax once daily after nausea subsides, can increase to twice daily if needed.     Subjective:    Outpatient Medications Prior to Visit  Medication Sig Dispense Refill   acetaminophen (TYLENOL) 650 MG CR tablet Take 650 mg by mouth every 8 (eight) hours as needed for pain.     acetaminophen-codeine (TYLENOL #3) 300-30 MG tablet Take 1 tablet by mouth every 6 (six) hours as needed for moderate pain (pain score 4-6). 30 tablet 0   albuterol (VENTOLIN HFA) 108 (90 Base) MCG/ACT inhaler Inhale 2 puffs into the lungs every 6 (six) hours as needed for wheezing or shortness of breath. 8 g 0   cholecalciferol (VITAMIN D) 1000 UNITS tablet Take 1,000 Units by mouth every evening.      Continuous Glucose Sensor (FREESTYLE LIBRE 3 PLUS SENSOR) MISC 1 each by Does not apply route every 14 (fourteen) days. 2 each 11   hydrochlorothiazide (HYDRODIURIL) 50 MG tablet Take 1/2 (one-half) tablet by mouth twice daily 90 tablet 2   HYDROcodone-acetaminophen (NORCO) 7.5-325 MG tablet Take 1 tablet by mouth every 6 (six) hours as needed for moderate pain (pain score 4-6). 20 tablet 0   insulin NPH Human (NOVOLIN N) 100 UNIT/ML injection Inject 0.3 mLs (30 Units total) into the skin 2 (two) times daily before a meal. 20 mL 0   insulin regular (NOVOLIN R) 100 units/mL injection Inject  15 Units into the skin 3 (three) times daily before meals.     metoprolol tartrate (LOPRESSOR) 50 MG tablet Take 0.5 tablets (25 mg total) by mouth 2 (two) times daily. 90 tablet 3   potassium chloride (KLOR-CON) 8 MEQ tablet Take 8 mEq by mouth 2 (two) times daily.     PRESCRIPTION MEDICATION Patient receives a Steroid injection in the neck once a year with Dr. Regino Schultze at [facility] pt is  unable to recall the exact name and i have been unable to verify name from the Dr's office. Patient also states " it shoots up my blood sugars really high, but i still get it because i can't take anything else for my neck pain"     Semaglutide,0.25 or 0.5MG /DOS, 2 MG/3ML SOPN Inject 0.25 mg into the skin once a week for 28 days, THEN 0.5 mg once a week for 28 days. 3 mL 1   tiZANidine (ZANAFLEX) 4 MG tablet Take 1 tablet (4 mg total) by mouth every 6 (six) hours as needed for muscle spasms. (Patient taking differently: Take 2-4 mg by mouth every 6 (six) hours as needed for muscle spasms.) 30 tablet 3   predniSONE (STERAPRED UNI-PAK 48 TAB) 5 MG (48) TBPK tablet Take 5 mg by mouth as directed. (Patient not taking: Reported on 12/30/2022)     ACCU-CHEK SMARTVIEW test strip Use 3x a day 300 each 4   Lancets (ACCU-CHEK MULTICLIX) lancets Use as instructed to test 3x daily DX E11.65 300 each 4   No facility-administered medications prior to visit.   Past Medical History:  Diagnosis Date   Arthritis    C. difficile colitis    Diabetes mellitus    Diverticulitis    Fibromyalgia    Hypercholesteremia    Hypertension    IBS (irritable bowel syndrome)    Migraines    Nasal fracture    Stein-Leventhal ovaries    Past Surgical History:  Procedure Laterality Date   CHOLECYSTECTOMY  1998   KNEE ARTHROSCOPY Right 2017   KNEE ARTHROSCOPY Left    LEFT HEART CATHETERIZATION WITH CORONARY ANGIOGRAM N/A 12/19/2011   Procedure: LEFT HEART CATHETERIZATION WITH CORONARY ANGIOGRAM;  Surgeon: Donato Schultz, MD;  Location: [facility] CATH LAB;  Service: Cardiovascular;  Laterality: N/A;   ROTATOR CUFF REPAIR Right    TONSILLECTOMY  1959   Allergies  Allergen Reactions   Ceftriaxone Hives, Itching and Other (See Comments)   Dicyclomine Hives    Hives, itching, swelling, vomiting, felt like something was sitting on chest   Erythromycin Anaphylaxis and Other (See Comments)   Iodinated Contrast Media Hives, Other  (See Comments) and Shortness Of Breath   Amoxicillin-Pot Clavulanate Nausea And Vomiting    Nausea, vomiting, headache, dizziness. Hives on 08/2019 Other reaction(s): hives, Other Nausea, vomiting, headache, dizziness   pt states she can take other Penicillins just not augmentin Nausea, vomiting, headache, dizziness. Hives on 08/2019    Canagliflozin Rash and Other (See Comments)    Dehydration Dehydration Dehydration    Statins Other (See Comments)    Severe muscle pain/weakness Muscle pain/ weekness    Sulfa Antibiotics Other (See Comments)    blackout PER PT SHE BLACKS OUT    Corticosteroids Other (See Comments)   Insulin Glargine Other (See Comments)    Severe ankle swelling   Metformin And Related Diarrhea and Nausea And Vomiting    Violently ill for 2 yrs   Metformin Hcl Other (See Comments)   Scallops [Shellfish Allergy] Diarrhea and  Nausea And Vomiting    Severe GI upset   Tramadol Hcl Other (See Comments)   Cipro [Ciprofloxacin Hcl] Rash    10/29-new rash   Nsaids Other (See Comments)    Stomach problems Stomach problems    Respiratory Syncytial Virus Mrna Vaccine Rash    Left Swelling arm, hot, fatigue   Tramadol Rash      Objective:    Physical Exam Vitals and nursing note reviewed.  Constitutional:      Appearance: Normal appearance.  Cardiovascular:     Rate and Rhythm: Normal rate and regular rhythm.  Pulmonary:     Effort: Pulmonary effort is normal.     Breath sounds: Normal breath sounds.  Abdominal:     General: Abdomen is protuberant. There is no distension.     Tenderness: There is generalized abdominal tenderness.  Musculoskeletal:        General: Normal range of motion.  Skin:    General: Skin is warm and dry.  Neurological:     Mental Status: She is alert.  Psychiatric:        Mood and Affect: Mood normal.        Behavior: Behavior normal.    BP 135/77 (BP Location: Left Arm, Patient Position: Sitting, Cuff Size: Large)   Pulse  90   Temp 98 F (36.7 C) (Temporal)   Ht 5\' 5"  (1.651 m)   SpO2 98%   BMI 32.28 kg/m  Wt Readings from Last 3 Encounters:  12/03/22 194 lb (88 kg)  12/03/22 194 lb (88 kg)  08/08/21 188 lb (85.3 kg)       Dulce Sellar, NP

## 2022-12-30 NOTE — Telephone Encounter (Signed)
Diverticulitis is acting up badly. Can't get in to see their specialist until mid February. Pt is asking if there is something you can do to speed up the process? Please advise

## 2022-12-31 ENCOUNTER — Telehealth: Payer: Self-pay

## 2022-12-31 ENCOUNTER — Other Ambulatory Visit: Payer: Self-pay

## 2022-12-31 DIAGNOSIS — M47816 Spondylosis without myelopathy or radiculopathy, lumbar region: Secondary | ICD-10-CM | POA: Diagnosis not present

## 2022-12-31 NOTE — Telephone Encounter (Signed)
Pt was contacted and made aware of Dr. Myrtie Neither recommendations: Pt stated that she was available for that date and time. Pt was notified that I would contact her first thing tomorrow morning after booking that appointment and confirming it with her.  Pt verbalized understanding with all questions answered.

## 2022-12-31 NOTE — Telephone Encounter (Signed)
Spoke to patient and she stated they contacted her today, will call her tomorrow because they may have an opening. Patient stated that she will wait on referral to surgeon until after her appointment with the specialist.

## 2022-12-31 NOTE — Telephone Encounter (Signed)
-----   Message from [name] sent at 12/31/2022 10:23 AM EST ----- Regarding: Needs clinic visit [name],  This patient I saw over a year ago needs follow-up for recurrent abdominal pain and constipation.  I heard from her primary care provider requesting assistance, and the patient is currently scheduled to see me in February.  I have an office appointment at 10:20 AM next Wednesday, December 4.  (10 AM arrival) the 7-day hold will come off tomorrow, so please contact her and strongly encourage her to come to that visit.   Thank you  H Danis

## 2023-01-01 NOTE — Telephone Encounter (Signed)
Pt made aware that she has been scheduled for the office visit to see Dr. Myrtie Neither on 01/08/2023 at 10:20: Pt made aware. Pt verbalized understanding with all questions answered.

## 2023-01-07 DIAGNOSIS — M67441 Ganglion, right hand: Secondary | ICD-10-CM | POA: Diagnosis not present

## 2023-01-08 ENCOUNTER — Ambulatory Visit: Payer: Medicare HMO | Admitting: Gastroenterology

## 2023-01-08 ENCOUNTER — Encounter: Payer: Self-pay | Admitting: Gastroenterology

## 2023-01-08 VITALS — BP 140/78 | HR 80 | Ht 65.0 in | Wt 184.0 lb

## 2023-01-08 DIAGNOSIS — E119 Type 2 diabetes mellitus without complications: Secondary | ICD-10-CM

## 2023-01-08 DIAGNOSIS — R1030 Lower abdominal pain, unspecified: Secondary | ICD-10-CM

## 2023-01-08 DIAGNOSIS — K5792 Diverticulitis of intestine, part unspecified, without perforation or abscess without bleeding: Secondary | ICD-10-CM | POA: Diagnosis not present

## 2023-01-08 DIAGNOSIS — K5900 Constipation, unspecified: Secondary | ICD-10-CM

## 2023-01-08 DIAGNOSIS — R140 Abdominal distension (gaseous): Secondary | ICD-10-CM

## 2023-01-08 NOTE — Patient Instructions (Signed)
   _______________________________________________________  If your blood pressure at your visit was 140/90 or greater, please contact your primary care physician to follow up on this.  _______________________________________________________  If you are age 73 or older, your body mass index should be between 23-30. Your Body mass index is 30.62 kg/m. If this is out of the aforementioned range listed, please consider follow up with your Primary Care Provider.  If you are age 75 or younger, your body mass index should be between 19-25. Your Body mass index is 30.62 kg/m. If this is out of the aformentioned range listed, please consider follow up with your Primary Care Provider.   ________________________________________________________  The [facility] providers would like to encourage you to use [facility] to communicate with providers for non-urgent requests or questions.  Due to long hold times on the telephone, sending your provider a message by [facility] may be a faster and more efficient way to get a response.  Please allow 48 business hours for a response.  Please remember that this is for non-urgent requests.  _______________________________________________________ It was a pleasure to see you today!  Thank you for trusting me with your gastrointestinal care!

## 2023-01-08 NOTE — Progress Notes (Signed)
GI Progress Note  Chief Complaint: Lower abdominal pain and constipation  Subjective  Prior history  [name] saw me for consultation in May 2023 for recurrent diverticulitis based on ED visits and CT scans in July and September 2022), clinical details outlined in that note.  She has had recurrent episodes of primarily left-sided abdominal pain and constipation.  C. difficile testing negative in June 2021, but positive in March 2023 with a PCR study, symptom improvement after vancomycin. Multiple medication allergies, including multiple antibiotics.  She also reported an allergic reaction to a trial of dicyclomine in the past. At the time of consultation with me in May 2023, she was not having ongoing diarrhea, so was not felt likely to have recurrent C. difficile.  She had ongoing bloating and cramps and a suspicion of possible colonic dysbiosis from multiple antibiotic treatments.  Previous colonoscopy with Dr. Marca Ancona at [facility] in 2021, and [name] recall that there had been polyps.  We requested those records after her visit with me but did not receive them.   ED visit 12/03/2022 for abdominal pain and bloating with constipation.  CT report as below (subtle stranding around an area in the proximal sigmoid).  Treated with antibiotics, then had an immediate rash allergic reaction that was believed likely due to Cipro rather than the Flagyl.  (Prompted a second ED visit that day) she was then switched to Augmentin, had more severe constipation with intense abdominal spasms nausea and vomiting. She had also recently started Ozempic. Primary care saw her for this on 12/30/2022, was uncertain how to proceed and reached out to me asking to expedite a follow-up visit.  Primary care was holding Ozempic and gave her another round of Augmentin.  Discussed the use of AI scribe software for clinical note transcription with the patient, who gave verbal consent to proceed.  History of Present  Illness   The patient, with a history of diverticulitis, presents with recurrent bouts of abdominal pain and altered bowel habits. They report having had four episodes of diverticulitis, two of which required ER visits. The patient notes a change in their symptoms over the last 4 to 6 months, with constipation now being predominant, as opposed to the diarrhea they experienced in the past. They recently completed a course of Augmentin, which they believe has led to some improvement. The patient reports a decrease in pain within a couple of days of starting the medication, and the bowel movements have become easier with daily use of Miralax.  The patient also reports a decrease in appetite, which they attribute to the ongoing battle with diverticulitis and multiple rounds of antibiotics. They have been trying to increase their fluid intake, but admit to struggling with this. 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 They recently started Ozempic for diabetes management, but decided to stop it due to the ongoing gastrointestinal issues.  The patient recalls having a colonoscopy in 2021, during which three polyps were found. They report having experienced significant bleeding and discomfort post-procedure. The patient expresses frustration with the recurrent bouts of what is being diagnosed as diverticulitis and is seeking a more definitive understanding and management of their symptoms.      Denies rectal bleeding or dysphagia.  When the pain episodes occur and are intense, she may have associated nausea and vomiting.  ROS: Cardiovascular:  no chest pain Respiratory: no dyspnea Chronic arthritic symptoms and back pain Headaches  Remainder of systems negative except as above The patient's Past Medical, Family and Social History were reviewed and are on file in the EMR. Past Medical History:  Diagnosis Date   Arthritis    C. difficile  colitis    Diabetes mellitus    Diverticulitis    Fibromyalgia    Hypercholesteremia    Hypertension    IBS (irritable bowel syndrome)    Migraines    Nasal fracture    Stein-L[place]hal ovaries     Past Surgical History:  Procedure Laterality Date   CHOLECYSTECTOMY  1998   KNEE ARTHROSCOPY Right 2017   KNEE ARTHROSCOPY Left    LEFT HEART CATHETERIZATION WITH CORONARY ANGIOGRAM N/A 12/19/2011   Procedure: LEFT HEART CATHETERIZATION WITH CORONARY ANGIOGRAM;  Surgeon: Donato Schultz, MD;  Location: [facility] CATH LAB;  Service: Cardiovascular;  Laterality: N/A;   ROTATOR CUFF REPAIR Right    TONSILLECTOMY  1959     Objective:  Med list reviewed  Current Outpatient Medications:    acetaminophen (TYLENOL) 650 MG CR tablet, Take 650 mg by mouth every 8 (eight) hours as needed for pain., Disp: , Rfl:    acetaminophen-codeine (TYLENOL #3) 300-30 MG tablet, Take 1 tablet by mouth every 6 (six) hours as needed for moderate pain (pain score 4-6)., Disp: 30 tablet, Rfl: 0   albuterol (VENTOLIN HFA) 108 (90 Base) MCG/ACT inhaler, Inhale 2 puffs into the lungs every 6 (six) hours as needed for wheezing or shortness of breath., Disp: 8 g, Rfl: 0   amoxicillin-clavulanate (AUGMENTIN) 875-125 MG tablet, Take 1 tablet by mouth 2 (two) times daily after a meal., Disp: 14 tablet, Rfl: 0   cholecalciferol (VITAMIN D) 1000 UNITS tablet, Take 1,000 Units by mouth every evening. , Disp: , Rfl:    Continuous Glucose Sensor (FREESTYLE LIBRE 3 PLUS SENSOR) MISC, 1 each by Does not apply route every 14 (fourteen) days., Disp: 2 each, Rfl: 11   hydrochlorothiazide (HYDRODIURIL) 50 MG tablet, Take 1/2 (one-half) tablet by mouth twice daily, Disp: 90 tablet, Rfl: 2   HYDROcodone-acetaminophen (NORCO) 7.5-325 MG tablet, Take 1 tablet by mouth every 6 (six) hours as needed for moderate pain (pain score 4-6)., Disp: 20 tablet, Rfl: 0   insulin NPH Human (NOVOLIN N) 100 UNIT/ML injection, Inject 0.3 mLs (30 Units total) into  the skin 2 (two) times daily before a meal., Disp: 20 mL, Rfl: 0   insulin regular (NOVOLIN R) 100 units/mL injection, Inject 15 Units into the skin 3 (three) times daily before meals., Disp: , Rfl:    metoprolol tartrate (LOPRESSOR) 50 MG tablet, Take 0.5 tablets (25 mg total) by mouth 2 (two) times daily., Disp: 90 tablet, Rfl: 3   potassium chloride (KLOR-CON) 8 MEQ tablet, Take 8 mEq by mouth 2 (two) times daily., Disp: , Rfl:    predniSONE (STERAPRED UNI-PAK 48 TAB) 5 MG (48) TBPK tablet, Take 5 mg by mouth as directed., Disp: , Rfl:    PRESCRIPTION MEDICATION, Patient receives a Steroid injection in the neck once a year with Dr. Regino Schultze at [facility] pt is unable to recall the exact name and i have been unable to verify name from the Dr's office. Patient also states " it shoots up my blood sugars really high, but i still get it because i can't take anything else for my neck pain", Disp: , Rfl:    Semaglutide,0.25 or 0.5MG /DOS, 2 MG/3ML SOPN, Inject 0.25 mg into the skin once a week for 28 days, THEN 0.5 mg  once a week for 28 days., Disp: 3 mL, Rfl: 1   tiZANidine (ZANAFLEX) 4 MG tablet, Take 1 tablet (4 mg total) by mouth every 6 (six) hours as needed for muscle spasms. (Patient taking differently: Take 2-4 mg by mouth every 6 (six) hours as needed for muscle spasms.), Disp: 30 tablet, Rfl: 3   Vital signs in last 24 hrs: Vitals:   01/08/23 1021  BP: (!) 140/78  Pulse: 80   Wt Readings from Last 3 Encounters:  01/08/23 184 lb (83.5 kg)  12/03/22 194 lb (88 kg)  12/03/22 194 lb (88 kg)    Physical Exam  Antalgic gait.  Gets on exam table slowly but independently HEENT: sclera anicteric, oral mucosa moist without lesions Neck: supple, no thyromegaly, JVD or lymphadenopathy Cardiac: Regular without appreciable murmur,  no peripheral edema Pulm: clear to auscultation bilaterally, normal RR and effort noted Abdomen: soft, no focal tenderness, with active bowel sounds. No guarding  or palpable hepatosplenomegaly. Skin; warm and dry, no jaundice or rash   Labs:   ___________________________________________ Radiologic studies:  CLINICAL DATA:  Left lower quadrant abdominal pain   EXAM: CT ABDOMEN AND PELVIS WITHOUT CONTRAST   TECHNIQUE: Multidetector CT imaging of the abdomen and pelvis was performed following the standard protocol without IV contrast.   RADIATION DOSE REDUCTION: This exam was performed according to the departmental dose-optimization program which includes automated exposure control, adjustment of the mA and/or kV according to patient size and/or use of iterative reconstruction technique.   COMPARISON:  01/03/2019   FINDINGS: Lower chest:  No contributory findings.   Hepatobiliary: No focal liver abnormality.Cholecystectomy. No biliary dilatation   Pancreas: Unremarkable.   Spleen: Unremarkable.   Adrenals/Urinary Tract: Negative adrenals. No hydronephrosis or stone. Unremarkable bladder.   Stomach/Bowel: Fat stranding surrounds a diverticulum at the upper sigmoid colon. No evidence of perforation or abscess. Colonic diverticulosis is primarily distal. No bowel obstruction or wall thickening. Moderate generalized stool retention.   Vascular/Lymphatic: No acute vascular abnormality. Atheromatous calcification of the aorta and branch vessels. No mass or adenopathy.   Reproductive:No pathologic findings.   Other: No ascites or pneumoperitoneum.   Musculoskeletal: No acute abnormalities. Generalized spinal degeneration. L4-5 and L5-S1 degenerative anterolisthesis. Lower thoracic bridging osteophytes.   IMPRESSION: Mild diverticulitis at the upper sigmoid colon.     Electronically Signed   By: Tiburcio Pea M.D.   On: 12/03/2022 05:48  ______________________________________ CLINICAL DATA:  Abdominal cramping and pain after eating.   EXAM: CT ABDOMEN AND PELVIS WITHOUT CONTRAST   TECHNIQUE: Multidetector CT  imaging of the abdomen and pelvis was performed following the standard protocol without IV contrast.   RADIATION DOSE REDUCTION: This exam was performed according to the departmental dose-optimization program which includes automated exposure control, adjustment of the mA and/or kV according to patient size and/or use of iterative reconstruction technique.   COMPARISON:  03/17/2021   FINDINGS: Lower chest: The lung bases are clear of acute process. No pleural effusion or pulmonary lesions. The heart is normal in size. No pericardial effusion. The distal esophagus and aorta are unremarkable.   Hepatobiliary: No hepatic lesions or intrahepatic biliary dilatation. The gallbladder is surgically absent. No common bile duct dilatation.   Pancreas: No mass, inflammation or ductal dilatation. Stable moderate pancreatic atrophy.   Spleen: Normal size. No focal lesions.   Adrenals/Urinary Tract: The adrenal glands and kidneys are unremarkable. No renal lesions, renal calculi or hydroureteronephrosis. No bladder lesions or bladder calculi.   Stomach/Bowel: The stomach,  duodenum and small bowel are unremarkable. No acute inflammatory process, mass lesions or obstructive findings. The terminal ileum is normal. The appendix is normal. Scattered colonic diverticulosis and focal uncomplicated diverticulitis involving the upper sigmoid colon. No free air or abscess.   Vascular/Lymphatic: Stable aortic and branch vessel calcifications. No aneurysm. No mesenteric or retroperitoneal mass or adenopathy. Small scattered lymph nodes are stable.   Reproductive: The uterus and ovaries are unremarkable.   Other: No pelvic mass or adenopathy. No free pelvic fluid collections. No inguinal mass or adenopathy. No abdominal wall hernia or subcutaneous lesions.   Musculoskeletal: No acute bony findings. Stable degenerative changes involving the spine.   IMPRESSION: 1. Acute uncomplicated  diverticulitis involving the upper sigmoid colon. 2. Status post cholecystectomy. No biliary dilatation. 3. Aortic atherosclerosis.   Aortic Atherosclerosis (ICD10-I70.0).     Electronically Signed   By: Rudie Meyer M.D.   On: 01/02/2022 15:43   (Images of both CT scans personally reviewed and limited by lack of IV contrast due to dye allergy) ____________________________________________ Other:   _____________________________________________   Encounter Diagnoses  Name Primary?   Acute diverticulitis Yes   Lower abdominal pain    Abdominal bloating     Assessment and Plan    Recurrent Diverticulitis Multiple episodes of abdominal pain, nausea, and vomiting. Recent change in bowel habits from diarrhea to constipation. Recent treatment with Augmentin and Metronidazole. CT findings suggestive of diverticulitis, but clinical picture may suggest constipation as a significant contributing factor.  I personally reviewed the most recent CT images, and the reported focal fat stranding around a diverticulum is a subtle finding that I think might not necessarily represent true diverticulitis.  These images are also limited by the lack of IV contrast due to her dye allergy. Given that and her overall symptom complex as well as multiple allergies and intolerances of antibiotics and other medicines, I would prefer to focus her treatment on relief of the constipation and see what happens with this episodic abdominal pain as a result rather than keep giving her antibiotics. -Continue Miralax daily, increase to twice daily if no bowel movement or insufficient bowel movement by end of day. -Consider resuming a magnesium supplement at bedtime for its colon-stimulating effect. -She will contact us with an update in the near future and we can consider a low-dose of either Amitiza or Linzess if she is still struggling with the constipation despite the above treatments.  She is understandably  reluctant given her previous experiences with some medicines. -Consider colonoscopy to evaluate for potential diverticular associated colitis or other colonic pathology, although patient is hesitant due to previous negative experience. She also has many health issues to address right now and feels somewhat overwhelmed by that.  So I did not pressure her on this and I left it as an available option of further testing depending on how she feels. No current indication for surgical evaluation, as it has not been clear that all these episodes are truly diverticulitis, and we need to focus on trying to regulate her bowel habits as best we can.  Doubtful that surgery would offer a sigmoid resection at this point given the risks versus questionable benefits.  Diabetes Elevated blood sugars despite decreased appetite and food intake, possibly due to insulin resistance. Recently stopped Ozempic due to current gastrointestinal issues. -Hold Ozempic for now due to potential contribution to constipation. -Resume diabetes management once gastrointestinal issues are resolved.  She has an appointment scheduled with me in February, so we  will keep that for now.  40 minutes were spent on this encounter (including chart review, history/exam, counseling/coordination of care, and documentation) > 50% of that time was spent on counseling and coordination of care.   [name]

## 2023-01-09 ENCOUNTER — Other Ambulatory Visit: Payer: Self-pay | Admitting: Pharmacist

## 2023-01-09 NOTE — Progress Notes (Signed)
01/09/2023 Name: [name] MRN: [id number] DOB: January 01, 1951  Chief Complaint  Patient presents with   Diabetes    [name] is a 72 y.o. year old female who presented for a telephone visit.   They were referred to the pharmacist by their PCP for assistance in managing diabetes, medication access, and complex medication management.    Subjective:  Diabetes:  Current medications: Novolin N 40 units twice a day and Novolin R 12 to 15 units twice a day  Past Therapies: 12/13/2022 Started Ozempic 0.25mg  weekly for 4 weeks. Plan was to increase to 0.5mg  weekly 01/10/2023 but pt stopped Ozempic due to constipation and GI issues. Her - last dose was 12/27/2022.  She had taken Ozempic in another time in the past but stopped due to cost. She thinks she got up to 0.5mg  weekly dose. She report positive effects on blood glucose but did not feel that she lost much weight in the past.   She has seen GI recently due to constipation. He had noted that her appetite had decreased. Pt felt was possibly due to diverticulitis but decreased appetite is usually seen with Ozempic.  Also started [place] 3 sensor on Friday 11/8.    Patient reports affordability concerns with their medications:  only ozempic cost but she is not currently taking Ozempic. In the past  we discussed applying for Novo medication assistance program in past but patient preferred to wait to see what cost of Ozempic will be in 2025. If she restarts or tries another GLP1 like agent can assess for medication assistance program in 2025.  Patient reports access/transportation concerns to their pharmacy: No  Patient reports adherence concerns with their medications:  Yes  didn't check blood glucose much by fingerstick because she is needle phobic   Date of Download: 01/09/2023 % Time CGM is active: 94%  Average Glucose: 247 mg/dL(was 259  2 weeks ago)  Glucose Management Indicator: 9.2% (was 9.0% previously) Glucose Variability:  30.5%(goal <36%) Time in Goal:  - Time in range 70-180: 18% (2  weeks ago was 29%) - Time above range: 71% (was 79%) - Time below range: 0% -  patient did have 1 low on 11/25 at around 10pm that lasted about 70 minutes. She did not recall this low or what might have caused it (skipping a meal, too much insulin)   Observed patterns: Blood glucose has increase over the last 2 weeks since patient stopped Ozempic but she reports she feels better off Ozempic.        Current medication access support: none. At previous visit, discussed 2025 Medicare changes. Patient will have [employer] plan in 2025. She reports it has about a $200 deductible which is managable for her. Then cost of brand meds will be $47 until she reaches $2000 out of pocket max - after that cost will be $0.  Provided medication assistance program application on 12/13/2022 for Novo Nordisk if she decides she needs in 2025.   Hyperlipidemia:   Current therapy - none Past therapies - patient has tried lovastatin, rosuvastatin and atorvastatin in past. Patient reports muscle pain and weakness with all 3 statins.  Ezetimibe - also caused muscle aches and pains; patient stopped on own - removed from med list during ED visit 12/03/2022.    Objective:  Lab Results  Component Value Date   HGBA1C 9.5 (H) 09/10/2022    Lab Results  Component Value Date   CREATININE 0.95 12/03/2022   BUN 22 12/03/2022  NA 135 12/03/2022   K 3.8 12/03/2022   CL 94 (L) 12/03/2022   CO2 29 12/03/2022    Lab Results  Component Value Date   CHOL 204 (H) 09/10/2022   HDL 52.70 09/10/2022   LDLCALC 121 (H) 09/10/2022   TRIG 153.0 (H) 09/10/2022   CHOLHDL 4 09/10/2022    Medications Reviewed Today   Medications were not reviewed in this encounter      Assessment/Plan:   Diabetes: Currently uncontrolled -unable to tolerate Ozempic - blood glucose has increased since she stopped.  - Continue 40 units twice a day and Novolin R 12 to 15  units twice a day.  - Will forward recent Continuous Glucose Monitor report to Dr Ruthine Dose. Consider increasing Novolin N 44 units twice a day or change to longer acting insulin Tresiba 65 units daily (usually lower total daily NPH dose by about 20%). Cost of Evaristo [facility] for all Medicare patients is $35 per month.  Another options is to increase Novoling R dose at breakfast and lunch since those seem to be the times that her blood glucose is highest.  - Continue to monitor Continuous Glucose.   Hyperlipidemia: LDL and Tg not at goal; patient is intolerant to statins and ezetimibe.  Limit intake of saturated fat. Follow [place] type diet.  Patient prefers not to retry statin therapy due to past side effect of myalgias.  Recheck lipids in 2 to 3 months - consider Repatha or Nexletol if LDL still > 100  Follow up in 2 to 4 weeks    Henrene Pastor, PharmD Clinical Pharmacist [facility]  Population Health [phone]

## 2023-01-20 DIAGNOSIS — M47816 Spondylosis without myelopathy or radiculopathy, lumbar region: Secondary | ICD-10-CM | POA: Diagnosis not present

## 2023-01-24 ENCOUNTER — Ambulatory Visit (INDEPENDENT_AMBULATORY_CARE_PROVIDER_SITE_OTHER): Payer: Medicare HMO | Admitting: Physician Assistant

## 2023-01-24 VITALS — BP 124/70 | HR 79 | Temp 97.8°F | Ht 65.0 in

## 2023-01-24 DIAGNOSIS — R300 Dysuria: Secondary | ICD-10-CM

## 2023-01-24 LAB — POCT URINALYSIS DIPSTICK
Bilirubin, UA: NEGATIVE
Glucose, UA: POSITIVE — AB
Ketones, UA: NEGATIVE
Leukocytes, UA: NEGATIVE
Nitrite, UA: POSITIVE
Protein, UA: NEGATIVE
Spec Grav, UA: 1.025 (ref 1.010–1.025)
Urobilinogen, UA: 0.2 U/dL
pH, UA: 5 (ref 5.0–8.0)

## 2023-01-24 MED ORDER — AMOXICILLIN-POT CLAVULANATE 875-125 MG PO TABS: 1.0000 | ORAL_TABLET | Freq: Two times a day (BID) | ORAL | 0 refills | Status: DC

## 2023-01-24 NOTE — Progress Notes (Signed)
[name] is a 72 y.o. female here for a new problem.  History of Present Illness:   Chief Complaint  Patient presents with   Dysuria    Pt c/o burning with urination and frequency, started on Wed. Took Azo OTC   HPI  Dysuria: Complains of dysuria and frequency starting Wednesday, 12/18.  Reports that she took 1 dose of Azo the day before yesterday but that didn't  provide much relief so she did not take any more doses.   Denies diarrhea, fever/chills, or nausea.  Denies unusual back pain. Has had recent issues with diverticulitis and recently has had some more looser stools than normal.  Past Medical History:  Diagnosis Date   Arthritis    C. difficile colitis    Diabetes mellitus    Diverticulitis    Fibromyalgia    Hypercholesteremia    Hypertension    IBS (irritable bowel syndrome)    Migraines    Nasal fracture    Stein-Leventhal ovaries     Social History   Tobacco Use   Smoking status: Never   Smokeless tobacco: Never  Vaping Use   Vaping status: Never Used  Substance Use Topics   Alcohol use: Yes    Comment: rare-once a year at a celebration   Drug use: No   Past Surgical History:  Procedure Laterality Date   CHOLECYSTECTOMY  1998   KNEE ARTHROSCOPY Right 2017   KNEE ARTHROSCOPY Left    LEFT HEART CATHETERIZATION WITH CORONARY ANGIOGRAM N/A 12/19/2011   Procedure: LEFT HEART CATHETERIZATION WITH CORONARY ANGIOGRAM;  Surgeon: Donato Schultz, MD;  Location: [facility] CATH LAB;  Service: Cardiovascular;  Laterality: N/A;   ROTATOR CUFF REPAIR Right    TONSILLECTOMY  1959   Family History  Adopted: Yes  Problem Relation Age of Onset   Failure to thrive Mother    Heart failure Father    Neuropathy Neg Hx    Allergies  Allergen Reactions   Ceftriaxone Hives, Itching and Other (See Comments)   Dicyclomine Hives    Hives, itching, swelling, vomiting, felt like something was sitting on chest   Erythromycin Anaphylaxis and Other (See Comments)   Iodinated  Contrast Media Hives, Other (See Comments) and Shortness Of Breath   Canagliflozin Rash and Other (See Comments)    Dehydration Dehydration Dehydration    Statins Other (See Comments)    Severe muscle pain/weakness Muscle pain/ weekness    Sulfa Antibiotics Other (See Comments)    blackout PER PT SHE BLACKS OUT    Corticosteroids Other (See Comments)   Insulin Glargine Other (See Comments)    Severe ankle swelling   Metformin And Related Diarrhea and Nausea And Vomiting    Violently ill for 2 yrs   Metformin Hcl Other (See Comments)   Scallops [Shellfish Allergy] Diarrhea and Nausea And Vomiting    Severe GI upset   Tramadol Hcl Other (See Comments)   Cipro [Ciprofloxacin Hcl] Rash    10/29-new rash   Nsaids Other (See Comments)    Stomach problems Stomach problems    Respiratory Syncytial Virus Mrna Vaccine Rash    Left Swelling arm, hot, fatigue   Tramadol Rash   Current Medications:   Current Outpatient Medications:    acetaminophen (TYLENOL) 650 MG CR tablet, Take 650 mg by mouth every 8 (eight) hours as needed for pain., Disp: , Rfl:    acetaminophen-codeine (TYLENOL #3) 300-30 MG tablet, Take 1 tablet by mouth every 6 (six) hours as needed for  moderate pain (pain score 4-6)., Disp: 30 tablet, Rfl: 0   albuterol (VENTOLIN HFA) 108 (90 Base) MCG/ACT inhaler, Inhale 2 puffs into the lungs every 6 (six) hours as needed for wheezing or shortness of breath., Disp: 8 g, Rfl: 0   amoxicillin-clavulanate (AUGMENTIN) 875-125 MG tablet, Take 1 tablet by mouth 2 (two) times daily., Disp: 14 tablet, Rfl: 0   cholecalciferol (VITAMIN D) 1000 UNITS tablet, Take 1,000 Units by mouth every evening. , Disp: , Rfl:    Continuous Glucose Sensor (FREESTYLE LIBRE 3 PLUS SENSOR) MISC, 1 each by Does not apply route every 14 (fourteen) days., Disp: 2 each, Rfl: 11   hydrochlorothiazide (HYDRODIURIL) 50 MG tablet, Take 1/2 (one-half) tablet by mouth twice daily, Disp: 90 tablet, Rfl: 2    HYDROcodone-acetaminophen (NORCO) 7.5-325 MG tablet, Take 1 tablet by mouth every 6 (six) hours as needed for moderate pain (pain score 4-6)., Disp: 20 tablet, Rfl: 0   insulin NPH Human (NOVOLIN N) 100 UNIT/ML injection, Inject 0.3 mLs (30 Units total) into the skin 2 (two) times daily before a meal. (Patient taking differently: Inject 40 Units into the skin 2 (two) times daily before a meal.), Disp: 20 mL, Rfl: 0   insulin regular (NOVOLIN R) 100 units/mL injection, Inject 15 Units into the skin 3 (three) times daily before meals., Disp: , Rfl:    metoprolol tartrate (LOPRESSOR) 50 MG tablet, Take 0.5 tablets (25 mg total) by mouth 2 (two) times daily., Disp: 90 tablet, Rfl: 3   potassium chloride (KLOR-CON) 8 MEQ tablet, Take 8 mEq by mouth 2 (two) times daily., Disp: , Rfl:    PRESCRIPTION MEDICATION, Patient receives a Steroid injection in the neck once a year with Dr. Regino Schultze at [facility] pt is unable to recall the exact name and i have been unable to verify name from the Dr's office. Patient also states " it shoots up my blood sugars really high, but i still get it because i can't take anything else for my neck pain", Disp: , Rfl:    tiZANidine (ZANAFLEX) 4 MG tablet, Take 1 tablet (4 mg total) by mouth every 6 (six) hours as needed for muscle spasms. (Patient taking differently: Take 2-4 mg by mouth every 6 (six) hours as needed for muscle spasms.), Disp: 30 tablet, Rfl: 3  Review of Systems:   ROS See pertinent positives and negatives as per the HPI.  Vitals:   Vitals:   01/24/23 1358  BP: 124/70  Pulse: 79  Temp: 97.8 F (36.6 C)  TempSrc: Temporal  SpO2: 98%  Height: 5\' 5"  (1.651 m)     Body mass index is 30.62 kg/m.  Physical Exam:   Physical Exam Vitals and nursing note reviewed.  Constitutional:      General: She is not in acute distress.    Appearance: She is well-developed. She is not ill-appearing or toxic-appearing.  Cardiovascular:     Rate and Rhythm:  Normal rate and regular rhythm.     Pulses: Normal pulses.     Heart sounds: Normal heart sounds, S1 normal and S2 normal.  Pulmonary:     Effort: Pulmonary effort is normal.     Breath sounds: Normal breath sounds.  Abdominal:     Tenderness: There is no right CVA tenderness or left CVA tenderness.  Skin:    General: Skin is warm and dry.  Neurological:     Mental Status: She is alert.     GCS: GCS eye subscore is 4.  GCS verbal subscore is 5. GCS motor subscore is 6.  Psychiatric:        Speech: Speech normal.        Behavior: Behavior normal. Behavior is cooperative.    Results for orders placed or performed in visit on 01/24/23  POCT urinalysis dipstick  Result Value Ref Range   Color, UA amber    Clarity, UA cloudy    Glucose, UA Positive (A) Negative   Bilirubin, UA Negative    Ketones, UA Negative    Spec Grav, UA 1.025 1.010 - 1.025   Blood, UA positve    pH, UA 5.0 5.0 - 8.0   Protein, UA Negative Negative   Urobilinogen, UA 0.2 0.2 or 1.0 E.U./dL   Nitrite, UA Positive    Leukocytes, UA Negative Negative   Appearance     Odor       Assessment and Plan:   Dysuria No red flags on exam.   Will initiate augmentin per orders to empirically treat urinary tract infection. Discussed taking medications as prescribed.  Reviewed return precautions including new or worsening sx Push fluids and rest.  I recommend that patient follow-up if symptoms worsen or persist despite treatment x 2-3 days, sooner if needed.   I,Emily Lagle,acting as a Neurosurgeon for [employer], PA.,have documented all relevant documentation on the behalf of Jarold Motto, PA,as directed by  Jarold Motto, PA while in the presence of Jarold Motto, [place].  I, Jarold Motto, [place], have reviewed all documentation for this visit. The documentation on 01/24/23 for the exam, diagnosis, procedures, and orders are all accurate and complete.  Jarold Motto, PA-C

## 2023-01-26 LAB — URINE CULTURE
MICRO NUMBER:: 15877066
SPECIMEN QUALITY:: ADEQUATE

## 2023-01-28 ENCOUNTER — Telehealth: Payer: Self-pay | Admitting: Pharmacist

## 2023-01-28 NOTE — Progress Notes (Signed)
Noted that patient's blood glucose has been higher the last 2 weeks. Had discussed with Dr Ruthine Dose about increasing her Novolin N dose after last appointment and Dr Ruthine Dose approved increasing Novolin N to 44 units twice a day.  Tried to call patient to discuss but had to LM on VM with new instructions.  Provided CB# if she has questions - [phone] (my direct line) or office number [phone]  Henrene Pastor, PharmD Clinical Pharmacist [facility]  Population Health [phone]

## 2023-03-13 ENCOUNTER — Ambulatory Visit (INDEPENDENT_AMBULATORY_CARE_PROVIDER_SITE_OTHER): Payer: Medicare (Managed Care) | Admitting: Family Medicine

## 2023-03-13 ENCOUNTER — Encounter: Payer: Self-pay | Admitting: Family Medicine

## 2023-03-13 VITALS — BP 140/81 | HR 74 | Temp 97.5°F | Resp 16 | Ht 65.0 in

## 2023-03-13 DIAGNOSIS — I10 Essential (primary) hypertension: Secondary | ICD-10-CM

## 2023-03-13 DIAGNOSIS — Z794 Long term (current) use of insulin: Secondary | ICD-10-CM | POA: Diagnosis not present

## 2023-03-13 DIAGNOSIS — E782 Mixed hyperlipidemia: Secondary | ICD-10-CM | POA: Diagnosis not present

## 2023-03-13 DIAGNOSIS — E1165 Type 2 diabetes mellitus with hyperglycemia: Secondary | ICD-10-CM

## 2023-03-13 DIAGNOSIS — G43009 Migraine without aura, not intractable, without status migrainosus: Secondary | ICD-10-CM | POA: Diagnosis not present

## 2023-03-13 DIAGNOSIS — G720 Drug-induced myopathy: Secondary | ICD-10-CM

## 2023-03-13 LAB — POCT GLYCOSYLATED HEMOGLOBIN (HGB A1C): Hemoglobin A1C: 9.9 % — AB (ref 4.0–5.6)

## 2023-03-13 LAB — MICROALBUMIN / CREATININE URINE RATIO
Creatinine,U: 66.4 mg/dL
Microalb Creat Ratio: 2.3 mg/g (ref 0.0–30.0)
Microalb, Ur: 1.5 mg/dL (ref 0.0–1.9)

## 2023-03-13 MED ORDER — HYDROCODONE-ACETAMINOPHEN 7.5-325 MG PO TABS: 1.0000 | ORAL_TABLET | Freq: Four times a day (QID) | ORAL | 0 refills | Status: AC | PRN

## 2023-03-13 MED ORDER — TEGADERM FILM 2-3/8"X2-3/4" MISC: 1.0000 | 3 refills | Status: DC

## 2023-03-13 MED ORDER — HYDROCHLOROTHIAZIDE 50 MG PO TABS: ORAL_TABLET | ORAL | 1 refills | Status: DC

## 2023-03-13 MED ORDER — METOPROLOL TARTRATE 50 MG PO TABS: 25.0000 mg | ORAL_TABLET | Freq: Two times a day (BID) | ORAL | 3 refills | Status: AC

## 2023-03-13 NOTE — Assessment & Plan Note (Signed)
 Chronic.  Mostly manageable  occ has to take hydrocodone .  Pdmp checked

## 2023-03-13 NOTE — Assessment & Plan Note (Signed)
 Chronic.  Uncontrolled.  Patient is intolerant of statins, zetia

## 2023-03-13 NOTE — Assessment & Plan Note (Signed)
 Chronic.  Uncontrolled with hyperglycemia.  She has not been compliant with diet.  Exercise is limited due to chronic back pain.  She has not been taking lunchtime insulin .  Does not want to go back to endocrinology.  Continue Novolin R 15 units bid ac and NPH 40bid.  seeing PharmD for assist w/CGM,insulin  mgmt.  Needs to get sugar controlled!

## 2023-03-13 NOTE — Progress Notes (Signed)
 Subjective:     Patient ID: [name], female    DOB: [birth year], 73 y.o.   MRN: [id number]  Chief Complaint  Patient presents with   Medical Management of Chronic Issues    3 month follow-up Not fasting    HPI HTN - Pt is on metoprolol  50 mg BID and hydrochloroTHIAZIDE  25 mg BID. Bp's running not checked.. No dizziness/cp/palp//sob.    Migraines. - Frequent migraines-about 6 in 2 months. On hydrocodone  7.5-325 mg PRN for pain every 6 hours.   Type 2 DM - Patient Is on Novolin R 12-15 units  BID and Novolin N 40 units BID. Sugars are not checked. Has needle phobia.  Has cgm  Seeing Pharm D.  Did well on ozempic .  But, put her in donut hole too fast    New ins in Jan as well-wants to hold off d/t cost  Diverticulitis- gets freq flares.  Seeing GI.  Taking miralax more regularly.   5.  HLD-intol statins  Discussed the use of AI scribe software for clinical note transcription with the patient, who gave verbal consent to proceed.  History of Present Illness   [name] is a 73 year old female with diabetes and hypertension who presents for a follow-up visit.  She manages her diabetes with insulin  N, taking 40 units twice daily, adjusting to 30 units if her blood sugar is low. She also takes insulin  R at breakfast and supper. She experiences significant blood sugar fluctuations, even without eating, which she attributes to stress and poor dietary habits. Her glucose monitor has had issues with falling off, but it is currently functioning well.  She has not been regularly checking her blood pressure but has found her blood pressure cuff and placed it in a visible location to remind her to use it.  She experiences migraines that are more muscle-related than typical migraines and has taken her migraine medication about six times since December.  In early January, she experienced a fall, landing on her knees, shoulder, and face. She reports occasional knee buckling and sharp  pains, which subside, and her shoulder was initially painful but has improved, allowing her to lift her arm. She is trying to maintain mobility to prevent stiffness.  Since December, she has been dealing with muscle spasms in her back, which are less severe than previous episodes and resolve more quickly. She is incorporating more physical activity into her routine, including using a new exercise machine while watching TV.  She has a history of diverticulitis, which seems to be under control. She is trying to eat more healthily but finds it challenging due to fatigue and food spoilage. She uses Miralax regularly to manage her symptoms.  She has noticed worsening vision since the summer, particularly with reading, and has been advised to get computer glasses, which have not been very effective. She plans to see an eye doctor soon.  She has a history of blood clots, which she attributes to prolonged immobility during illness. She tries to get up and move every hour and wears compression stockings, although they become uncomfortable by the end of the day.  She has seborrheic keratoses on her back, which she is treating with a topical ointment. She is not concerned about them being cancerous but is addressing them for cosmetic reasons.       Health Maintenance Due  Topic Date Due   Medicare Annual Wellness (AWV)  Never done   DTaP/Tdap/Td (2 - Td or Tdap)  07/27/2019   Diabetic kidney evaluation - Urine ACR  02/21/2023   FOOT EXAM  02/21/2023    Past Medical History:  Diagnosis Date   Arthritis    C. difficile colitis    Diabetes mellitus    Diverticulitis    Fibromyalgia    Hypercholesteremia    Hypertension    IBS (irritable bowel syndrome)    Migraines    Nasal fracture    Stein-Leventhal ovaries     Past Surgical History:  Procedure Laterality Date   CHOLECYSTECTOMY  1998   KNEE ARTHROSCOPY Right 2017   KNEE ARTHROSCOPY Left    LEFT HEART CATHETERIZATION WITH CORONARY  ANGIOGRAM N/A 12/19/2011   Procedure: LEFT HEART CATHETERIZATION WITH CORONARY ANGIOGRAM;  Surgeon: Oneil Parchment, MD;  Location: [facility] CATH LAB;  Service: Cardiovascular;  Laterality: N/A;   ROTATOR CUFF REPAIR Right    TONSILLECTOMY  1959     Current Outpatient Medications:    acetaminophen  (TYLENOL ) 650 MG CR tablet, Take 650 mg by mouth every 8 (eight) hours as needed for pain., Disp: , Rfl:    acetaminophen -codeine  (TYLENOL  #3) 300-30 MG tablet, Take 1 tablet by mouth every 6 (six) hours as needed for moderate pain (pain score 4-6)., Disp: 30 tablet, Rfl: 0   albuterol  (VENTOLIN  HFA) 108 (90 Base) MCG/ACT inhaler, Inhale 2 puffs into the lungs every 6 (six) hours as needed for wheezing or shortness of breath., Disp: 8 g, Rfl: 0   Apoaequorin (PREVAGEN PO), Take by mouth., Disp: , Rfl:    cholecalciferol (VITAMIN D) 1000 UNITS tablet, Take 1,000 Units by mouth every evening. , Disp: , Rfl:    Continuous Glucose Sensor (FREESTYLE LIBRE 3 PLUS SENSOR) MISC, 1 each by Does not apply route every 14 (fourteen) days., Disp: 2 each, Rfl: 11   hydrochlorothiazide  (HYDRODIURIL ) 50 MG tablet, Take 1/2 (one-half) tablet by mouth twice daily, Disp: 90 tablet, Rfl: 2   HYDROcodone -acetaminophen  (NORCO) 7.5-325 MG tablet, Take 1 tablet by mouth every 6 (six) hours as needed for moderate pain (pain score 4-6)., Disp: 20 tablet, Rfl: 0   insulin  NPH Human (NOVOLIN N) 100 UNIT/ML injection, Inject 0.3 mLs (30 Units total) into the skin 2 (two) times daily before a meal. (Patient taking differently: Inject 40 Units into the skin 2 (two) times daily before a meal.), Disp: 20 mL, Rfl: 0   insulin  regular (NOVOLIN R) 100 units/mL injection, Inject 15 Units into the skin 3 (three) times daily before meals., Disp: , Rfl:    metoprolol  tartrate (LOPRESSOR ) 50 MG tablet, Take 0.5 tablets (25 mg total) by mouth 2 (two) times daily., Disp: 90 tablet, Rfl: 3   Multiple Vitamins-Minerals (PRESERVISION AREDS PO), Take by  mouth., Disp: , Rfl:    potassium chloride  (KLOR-CON ) 8 MEQ tablet, Take 8 mEq by mouth 2 (two) times daily., Disp: , Rfl:    PRESCRIPTION MEDICATION, Patient receives a Steroid injection in the neck once a year with Dr. Cesario at [facility] pt is unable to recall the exact name and i have been unable to verify name from the Dr's office. Patient also states  it shoots up my blood sugars really high, but i still get it because i can't take anything else for my neck pain, Disp: , Rfl:    tiZANidine  (ZANAFLEX ) 4 MG tablet, Take 1 tablet (4 mg total) by mouth every 6 (six) hours as needed for muscle spasms. (Patient taking differently: Take 2-4 mg by mouth every 6 (six) hours as needed  for muscle spasms.), Disp: 30 tablet, Rfl: 3  Allergies  Allergen Reactions   Ceftriaxone  Hives, Itching and Other (See Comments)   Dicyclomine  Hives    Hives, itching, swelling, vomiting, felt like something was sitting on chest   Erythromycin Anaphylaxis and Other (See Comments)   Iodinated Contrast Media Hives, Other (See Comments) and Shortness Of Breath   Canagliflozin Rash and Other (See Comments)    Dehydration Dehydration Dehydration    Statins Other (See Comments)    Severe muscle pain/weakness Muscle pain/ weekness    Sulfa Antibiotics Other (See Comments)    blackout PER PT SHE BLACKS OUT    Corticosteroids Other (See Comments)   Insulin  Glargine Other (See Comments)    Severe ankle swelling   Metformin And Related Diarrhea and Nausea And Vomiting    Violently ill for 2 yrs   Metformin Hcl Other (See Comments)   Scallops [Shellfish Allergy] Diarrhea and Nausea And Vomiting    Severe GI upset   Tramadol Hcl Other (See Comments)   Cipro  [Ciprofloxacin  Hcl] Rash    10/29-new rash   Nsaids Other (See Comments)    Stomach problems Stomach problems    Respiratory Syncytial Virus Mrna Vaccine Rash    Left Swelling arm, hot, fatigue   Tramadol Rash   ROS neg/noncontributory except as  noted HPI/below      Objective:     BP (!) 140/81   Pulse 74   Temp (!) 97.5 F (36.4 C) (Temporal)   Resp 16   Ht 5' 5 (1.651 m)   SpO2 99%   BMI 30.62 kg/m  Wt Readings from Last 3 Encounters:  01/08/23 184 lb (83.5 kg)  12/03/22 194 lb (88 kg)  12/03/22 194 lb (88 kg)    Physical Exam   Gen: WDWN NAD HEENT: NCAT, conjunctiva not injected, sclera nonicteric NECK:  supple, no thyromegaly, no nodes, no carotid bruits CARDIAC: RRR, S1S2+, no murmur. DP 2+B LUNGS: CTAB. No wheezes ABDOMEN:  BS+, soft, NTND, No HSM, no masses EXT:  chronic edema MSK: cane NEURO: A&O x3.  CN II-XII intact.  PSYCH: normal mood. Good eye contact  Sk's on back  Results for orders placed or performed in visit on 03/13/23  POCT HgB A1C   Collection Time: 03/13/23  9:25 AM  Result Value Ref Range   Hemoglobin A1C 9.9 (A) 4.0 - 5.6 %       Assessment & Plan:  Type 2 diabetes mellitus with hyperglycemia, with long-term current use of insulin  (HCC) Assessment & Plan: Chronic.  Uncontrolled with hyperglycemia.  She has not been compliant with diet.  Exercise is limited due to chronic back pain.  She has not been taking lunchtime insulin .  Does not want to go back to endocrinology.  Continue Novolin R 15 units bid ac and NPH 40bid.  seeing PharmD for assist w/CGM,insulin  mgmt.  Needs to get sugar controlled!    Orders: -     Microalbumin / creatinine urine ratio -     POCT glycosylated hemoglobin (Hb A1C)  Essential hypertension Assessment & Plan: Chronic.  Controlled.  Continue metoprolol  50 mg twice daily and hydrochlorothiazide  50 mg daily   Mixed hyperlipidemia Assessment & Plan: Chronic.  Uncontrolled.  Patient is intolerant of statins, zetia    Migraine without aura and without status migrainosus, not intractable Assessment & Plan: Chronic.  Mostly manageable  occ has to take hydrocodone .  Pdmp checked   Drug-induced myopathy  Long-term insulin  use (HCC)  Assessment and  Plan    Diabetes Mellitus Managing diabetes with insulin  N and R, currently taking 40 units of insulin  N twice daily, adjusting to 30 units if hypoglycemic. Insulin  R is administered at breakfast and supper. Reports blood glucose fluctuations and issues with CGM adherence, attributed to poor dietary habits. Emphasized the importance of postprandial glucose checks and using adhesive patches for CGM. Continue insulin  N 40 units twice daily, adjusting to 30 units if hypoglycemic, and insulin  R at breakfast and supper. Check blood glucose two hours postprandial. Contact Tammy for adhesive patches prescription for CGM. Encourage healthier eating habits and regular glucose monitoring.  Hypertension Not regularly monitoring blood pressure but has placed the cuff in a visible location. Emphasized the importance of regular monitoring for effective management. Encourage regular blood pressure monitoring.  Migraine Experiencing muscle-related migraines approximately six times since December, with muscle pain as a contributing factor. Discussed managing muscle pain to reduce migraine frequency. Continue current migraine management plan.  Fall with Knee and Shoulder Pain Fell in early January, resulting in knee and shoulder pain. Knees occasionally buckle with sharp pains, while shoulder pain has improved but was initially severe. Discussed shoulder exercises to prevent stiffness and frozen shoulder. Encourage shoulder exercises to prevent stiffness and frozen shoulder. Monitor knee pain and stability.  Depression and Fatigue Feeling fatigued, struggling with willpower, and poor dietary habits, with difficulty motivating to exercise. Discussed setting small, manageable exercise goals and using the exercise peddler for five minutes daily while watching TV. Encourage use of exercise peddler for five minutes daily while watching TV. Encourage small, manageable exercise goals.  Diverticulitis Improvement in  symptoms, on a regular Miralax regimen, and attempting healthier eating. Discussed the importance of a healthy diet with regular fiber intake. Continue Miralax as needed. Encourage a healthy diet with regular fiber intake.  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 Discussed the option of topical treatment on accessible keratoses, though results may vary. May continue using topical treatment on accessible keratoses.  General Health Maintenance Due for routine health maintenance checks. Discussed the importance of regular monitoring for diabetes-related complications. Perform finger stick A1c today and a urine test for protein and kidney function. Schedule a follow-up appointment in three months for comprehensive labs.  Follow-up Follow up with the eye doctor next week and with the orthopedic specialist for a knee check in three months.         Return in about 3 months (around 06/10/2023) for chronic follow-up.  Jenkins CHRISTELLA Carrel, MD

## 2023-03-13 NOTE — Patient Instructions (Signed)

## 2023-03-13 NOTE — Progress Notes (Signed)
Urine protein ok

## 2023-03-13 NOTE — Assessment & Plan Note (Signed)
Chronic.  Controlled.  Continue metoprolol 50 mg twice daily and hydrochlorothiazide 50 mg daily

## 2023-03-24 ENCOUNTER — Other Ambulatory Visit (HOSPITAL_BASED_OUTPATIENT_CLINIC_OR_DEPARTMENT_OTHER): Payer: Self-pay

## 2023-03-24 ENCOUNTER — Encounter: Payer: Self-pay | Admitting: Physician Assistant

## 2023-03-24 ENCOUNTER — Ambulatory Visit (INDEPENDENT_AMBULATORY_CARE_PROVIDER_SITE_OTHER): Payer: Medicare (Managed Care) | Admitting: Physician Assistant

## 2023-03-24 VITALS — BP 138/88 | HR 83 | Temp 97.4°F | Ht 65.0 in | Wt 189.0 lb

## 2023-03-24 DIAGNOSIS — R300 Dysuria: Secondary | ICD-10-CM | POA: Diagnosis not present

## 2023-03-24 DIAGNOSIS — R350 Frequency of micturition: Secondary | ICD-10-CM | POA: Diagnosis not present

## 2023-03-24 LAB — POCT URINALYSIS DIPSTICK
Bilirubin, UA: 3
Blood, UA: 3
Glucose, UA: POSITIVE — AB
Nitrite, UA: POSITIVE
Protein, UA: POSITIVE — AB
Spec Grav, UA: 1.015 (ref 1.010–1.025)
Urobilinogen, UA: >=8 U/dL — AB
pH, UA: 6 (ref 5.0–8.0)

## 2023-03-24 MED ORDER — AMOXICILLIN-POT CLAVULANATE 875-125 MG PO TABS: 1.0000 | ORAL_TABLET | Freq: Two times a day (BID) | ORAL | 0 refills | Status: DC

## 2023-03-24 MED ORDER — TEGADERM FILM 2-3/8"X2-3/4" MISC
1.0000 | 3 refills | Status: DC
  Filled 2023-03-24: qty 20, fill #0

## 2023-03-24 NOTE — Patient Instructions (Signed)
 It was great to see you!  Start the Augmentin for your suspected urinary tract infection (UTI) I will be in touch with culture results when they return  General instructions Make sure you: Pee until your bladder is empty. Do not hold pee for a long time. Empty your bladder after sex. Wipe from front to back after pooping if you are a female. Use each tissue one time when you wipe. Drink enough fluid to keep your pee pale yellow. Keep all follow-up visits as told by your doctor. This is important. Contact a doctor if: You do not get better after 1-2 days. Your symptoms go away and then come back. Get help right away if: You have very bad back pain. You have very bad pain in your lower belly. You have a fever. You are sick to your stomach (nauseous). You are throwing up.   Take care,  Jarold Motto PA-C

## 2023-03-24 NOTE — Progress Notes (Signed)
 [name] is a 73 y.o. female here for a new problem.  History of Present Illness:   Chief Complaint  Patient presents with   Urinary Frequency    Pt c/o urinary frequency started on Sat and then dysuria started. Has been taking Azo. Denies fever or chills.    Urinary Frequency  Patient complains today of urinary frequency that started this past Thursday. Associated symptoms include dysuria and itching that started on Friday. Does report a history of similar symptoms this past December.  Does report taking Azo to help relief her symptoms.  She was previously treated with Augmentin. She states that she does report mild constipation but is currently managed with Miralax and followed by GI.  Denies any diarrhea, fever,chills, or nausea.   Past Medical History:  Diagnosis Date   Arthritis    C. difficile colitis    Diabetes mellitus    Diverticulitis    Fibromyalgia    Hypercholesteremia    Hypertension    IBS (irritable bowel syndrome)    Migraines    Nasal fracture    Stein-Leventhal ovaries      Social History   Tobacco Use   Smoking status: Never   Smokeless tobacco: Never  Vaping Use   Vaping status: Never Used  Substance Use Topics   Alcohol use: Yes    Comment: rare-once a year at a celebration   Drug use: No    Past Surgical History:  Procedure Laterality Date   CHOLECYSTECTOMY  1998   KNEE ARTHROSCOPY Right 2017   KNEE ARTHROSCOPY Left    LEFT HEART CATHETERIZATION WITH CORONARY ANGIOGRAM N/A 12/19/2011   Procedure: LEFT HEART CATHETERIZATION WITH CORONARY ANGIOGRAM;  Surgeon: Donato Schultz, MD;  Location: [facility] CATH LAB;  Service: Cardiovascular;  Laterality: N/A;   ROTATOR CUFF REPAIR Right    TONSILLECTOMY  1959    Family History  Adopted: Yes  Problem Relation Age of Onset   Failure to thrive Mother    Heart failure Father    Neuropathy Neg Hx     Allergies  Allergen Reactions   Ceftriaxone Hives, Itching and Other (See Comments)    Dicyclomine Hives    Hives, itching, swelling, vomiting, felt like something was sitting on chest   Erythromycin Anaphylaxis and Other (See Comments)   Iodinated Contrast Media Hives, Other (See Comments) and Shortness Of Breath   Canagliflozin Rash and Other (See Comments)    Dehydration Dehydration Dehydration    Statins Other (See Comments)    Severe muscle pain/weakness Muscle pain/ weekness    Sulfa Antibiotics Other (See Comments)    blackout PER PT SHE BLACKS OUT    Corticosteroids Other (See Comments)   Insulin Glargine Other (See Comments)    Severe ankle swelling   Metformin And Related Diarrhea and Nausea And Vomiting    Violently ill for 2 yrs   Metformin Hcl Other (See Comments)   Scallops [Shellfish Allergy] Diarrhea and Nausea And Vomiting    Severe GI upset   Tramadol Hcl Other (See Comments)   Cipro [Ciprofloxacin Hcl] Rash    10/29-new rash   Nsaids Other (See Comments)    Stomach problems Stomach problems    Respiratory Syncytial Virus Mrna Vaccine Rash    Left Swelling arm, hot, fatigue   Tramadol Rash    Current Medications:   Current Outpatient Medications:    acetaminophen (TYLENOL) 650 MG CR tablet, Take 650 mg by mouth every 8 (eight) hours as needed for pain., Disp: ,  Rfl:    acetaminophen-codeine (TYLENOL #3) 300-30 MG tablet, Take 1 tablet by mouth every 6 (six) hours as needed for moderate pain (pain score 4-6)., Disp: 30 tablet, Rfl: 0   albuterol (VENTOLIN HFA) 108 (90 Base) MCG/ACT inhaler, Inhale 2 puffs into the lungs every 6 (six) hours as needed for wheezing or shortness of breath., Disp: 8 g, Rfl: 0   amoxicillin-clavulanate (AUGMENTIN) 875-125 MG tablet, Take 1 tablet by mouth 2 (two) times daily., Disp: 14 tablet, Rfl: 0   Apoaequorin (PREVAGEN PO), Take by mouth., Disp: , Rfl:    cholecalciferol (VITAMIN D) 1000 UNITS tablet, Take 1,000 Units by mouth every evening. , Disp: , Rfl:    Continuous Glucose Sensor (FREESTYLE LIBRE 3 PLUS  SENSOR) MISC, 1 each by Does not apply route every 14 (fourteen) days., Disp: 2 each, Rfl: 11   hydrochlorothiazide (HYDRODIURIL) 50 MG tablet, Take 1/2 (one-half) tablet by mouth twice daily, Disp: 90 tablet, Rfl: 1   HYDROcodone-acetaminophen (NORCO) 7.5-325 MG tablet, Take 1 tablet by mouth every 6 (six) hours as needed for moderate pain (pain score 4-6)., Disp: 20 tablet, Rfl: 0   insulin NPH Human (NOVOLIN N) 100 UNIT/ML injection, Inject 0.3 mLs (30 Units total) into the skin 2 (two) times daily before a meal. (Patient taking differently: Inject 40 Units into the skin 2 (two) times daily before a meal.), Disp: 20 mL, Rfl: 0   insulin regular (NOVOLIN R) 100 units/mL injection, Inject 15 Units into the skin 3 (three) times daily before meals., Disp: , Rfl:    metoprolol tartrate (LOPRESSOR) 50 MG tablet, Take 0.5 tablets (25 mg total) by mouth 2 (two) times daily., Disp: 90 tablet, Rfl: 3   Multiple Vitamins-Minerals (PRESERVISION AREDS PO), Take by mouth., Disp: , Rfl:    potassium chloride (KLOR-CON) 8 MEQ tablet, Take 8 mEq by mouth 2 (two) times daily., Disp: , Rfl:    PRESCRIPTION MEDICATION, Patient receives a Steroid injection in the neck once a year with Dr. Regino Schultze at [facility] pt is unable to recall the exact name and i have been unable to verify name from the Dr's office. Patient also states " it shoots up my blood sugars really high, but i still get it because i can't take anything else for my neck pain", Disp: , Rfl:    tiZANidine (ZANAFLEX) 4 MG tablet, Take 1 tablet (4 mg total) by mouth every 6 (six) hours as needed for muscle spasms. (Patient taking differently: Take 2-4 mg by mouth every 6 (six) hours as needed for muscle spasms.), Disp: 30 tablet, Rfl: 3   Transparent Dressings (TEGADERM FILM 2-3/8"X2-3/4") MISC, 1 each by Does not apply route once a week. Put over glucometer sensor to keep in place, Disp: 20 each, Rfl: 3   Review of Systems:   Review of Systems   Constitutional:  Negative for chills and fever.  Gastrointestinal:  Negative for diarrhea and nausea.  Genitourinary:  Positive for dysuria and frequency.    Vitals:   Vitals:   03/24/23 1048  BP: 138/88  Pulse: 83  Temp: (!) 97.4 F (36.3 C)  TempSrc: Temporal  SpO2: 97%  Weight: 189 lb (85.7 kg)  Height: 5\' 5"  (1.651 m)     Body mass index is 31.45 kg/m.  Physical Exam:   Physical Exam Vitals and nursing note reviewed.  Constitutional:      General: She is not in acute distress.    Appearance: She is well-developed. She is not ill-appearing  or toxic-appearing.  Cardiovascular:     Rate and Rhythm: Normal rate and regular rhythm.     Pulses: Normal pulses.     Heart sounds: Normal heart sounds, S1 normal and S2 normal.  Pulmonary:     Effort: Pulmonary effort is normal.     Breath sounds: Normal breath sounds.  Abdominal:     Tenderness: There is no right CVA tenderness or left CVA tenderness.  Skin:    General: Skin is warm and dry.  Neurological:     Mental Status: She is alert.     GCS: GCS eye subscore is 4. GCS verbal subscore is 5. GCS motor subscore is 6.  Psychiatric:        Speech: Speech normal.        Behavior: Behavior normal. Behavior is cooperative.    Results for orders placed or performed in visit on 03/24/23  POCT urinalysis dipstick  Result Value Ref Range   Color, UA orange    Clarity, UA cloudy    Glucose, UA Positive (A) Negative   Bilirubin, UA 3    Ketones, UA Trace    Spec Grav, UA 1.015 1.010 - 1.025   Blood, UA 3    pH, UA 6.0 5.0 - 8.0   Protein, UA Positive (A) Negative   Urobilinogen, UA >=8.0 (A) 0.2 or 1.0 E.U./dL   Nitrite, UA Positive    Leukocytes, UA Large (3+) (A) Negative   Appearance     Odor      Assessment and Plan:   Frequency of urination No red flags on exam.   Will initiate Augmentin for suspect urinary tract infection (UTI) per orders.  Discussed taking medications as prescribed.  Reviewed return  precautions and provided on AVS Push fluids and rest.    Jarold Motto, PA-C  I,Safa M Kadhim,acting as a scribe for Jarold Motto, PA.,have documented all relevant documentation on the behalf of Jarold Motto, PA,as directed by  Jarold Motto, PA while in the presence of Jarold Motto, [place].   I, Jarold Motto, [place], have reviewed all documentation for this visit. The documentation on 03/24/23 for the exam, diagnosis, procedures, and orders are all accurate and complete.

## 2023-03-25 ENCOUNTER — Ambulatory Visit: Payer: Medicare (Managed Care) | Admitting: Gastroenterology

## 2023-03-25 ENCOUNTER — Encounter: Payer: Self-pay | Admitting: Gastroenterology

## 2023-03-25 VITALS — BP 134/72 | HR 88 | Ht 64.25 in

## 2023-03-25 DIAGNOSIS — K5909 Other constipation: Secondary | ICD-10-CM

## 2023-03-25 DIAGNOSIS — R140 Abdominal distension (gaseous): Secondary | ICD-10-CM

## 2023-03-25 DIAGNOSIS — R1030 Lower abdominal pain, unspecified: Secondary | ICD-10-CM | POA: Diagnosis not present

## 2023-03-25 NOTE — Patient Instructions (Addendum)
   _______________________________________________________  If your blood pressure at your visit was 140/90 or greater, please contact your primary care physician to follow up on this.  _______________________________________________________  If you are age 74 or older, your body mass index should be between 23-30. Your Body mass index is 32.19 kg/m. If this is out of the aforementioned range listed, please consider follow up with your Primary Care Provider.  If you are age 68 or younger, your body mass index should be between 19-25. Your Body mass index is 32.19 kg/m. If this is out of the aformentioned range listed, please consider follow up with your Primary Care Provider.   ________________________________________________________  The [facility] providers would like to encourage you to use [facility] to communicate with providers for non-urgent requests or questions.  Due to long hold times on the telephone, sending your provider a message by [facility] may be a faster and more efficient way to get a response.  Please allow 48 business hours for a response.  Please remember that this is for non-urgent requests.  _______________________________________________________ It was a pleasure to see you today!  Thank you for trusting me with your gastrointestinal care!

## 2023-03-25 NOTE — Progress Notes (Addendum)
 [name]  Chief Complaint: Abdominal pain and constipation, history of diverticulitis.  Subjective  Prior history  [name] saw me for consultation in May 2023 for recurrent diverticulitis based on ED visits and CT scans in July and September 2022), clinical details outlined in that Sexton.  She has had recurrent episodes of primarily left-sided abdominal pain and constipation.  C. difficile testing negative in June 2021, but positive in March 2023 with a PCR study, symptom improvement after vancomycin. Multiple medication allergies, including multiple antibiotics.  She also reported an allergic reaction to a trial of dicyclomine in the past. At the time of consultation with me in May 2023, she was not having ongoing diarrhea, so was not felt likely to have recurrent C. difficile.  She had ongoing bloating and cramps and a suspicion of possible colonic dysbiosis from multiple antibiotic treatments.   Previous colonoscopy with Dr. Marca Ancona at [facility] in 2021, and [name] recall that there had been polyps.  We requested those records after her visit with me but did not receive them.     ED visit 12/03/2022 for abdominal pain and bloating with constipation.  CT report as below (subtle stranding around an area in the proximal sigmoid).  Treated with antibiotics, then had an immediate rash allergic reaction that was believed likely due to Cipro rather than the Flagyl.  (Prompted a second ED visit that day) she was then switched to Augmentin, had more severe constipation with intense abdominal spasms nausea and vomiting. She had also recently started Ozempic. Primary care saw her for this on 12/30/2022, was uncertain how to proceed and reached out to me asking to expedite a follow-up visit.  Primary care was holding Ozempic and gave her another round of Augmentin.  At the time of December 2024 office visit with me, I was not convinced that this subtle reported stranding around the left  colon diverticulum necessarily represented true diverticulitis (images reviewed).  It seemed to needed to focus on her constipation treatment, some plans for which I outlined in that Sexton.  I also offered her a repeat colonoscopy to evaluate for the possibility of SCAD, which she did not feel she was up to doing at that point with some other medical issues going on.   Discussed the use of AI scribe software for clinical Sexton transcription with the patient, who gave verbal consent to proceed.  History of Present Illness   [name] is a 73 year old female who presents for follow-up on bowel habits and abdominal pain.  She has experienced significant improvement in bowel habits since her last visit in December. Previously, she had frequent bowel movements, up to five or six times a day, after starting Miralax. She now adjusts her Miralax intake based on symptoms, taking it only when she feels bloated or has not eaten properly, leading to more regular bowel movements and reduced abdominal pain. No severe abdominal pain has occurred recently. She also mentions a recent urinary tract infection for which she started antibiotics, resulting in symptom improvement.  Regarding diabetes management, she notes fluctuations in blood sugar levels. She was initially on Ozempic, which was discontinued, and her general practitioner has since adjusted her insulin regimen. She now uses a sliding scale for insulin dosing, with doses ranging from 30 to 40 units depending on her blood sugar levels. She experiences some episodes of hypoglycemia and hyperglycemia.  She has difficulty walking and has incorporated a seated pedal exercise machine to  maintain some level of physical activity. She also mentions upcoming spinal injections to aid in mobility.   ROS: Cardiovascular:  no chest pain Respiratory: no dyspnea  The patient's Past Medical, Family and Social History were reviewed and are on file in the EMR. Past  Medical History:  Diagnosis Date   Arthritis    C. difficile colitis    Diabetes mellitus    Diverticulitis    Fibromyalgia    Hypercholesteremia    Hypertension    IBS (irritable bowel syndrome)    Migraines    Nasal fracture    Stein-Leventhal ovaries     Past Surgical History:  Procedure Laterality Date   CHOLECYSTECTOMY  1998   KNEE ARTHROSCOPY Right 2017   KNEE ARTHROSCOPY Left    LEFT HEART CATHETERIZATION WITH CORONARY ANGIOGRAM N/A 12/19/2011   Procedure: LEFT HEART CATHETERIZATION WITH CORONARY ANGIOGRAM;  Surgeon: Donato Schultz, Sexton;  Location: [facility] CATH LAB;  Service: Cardiovascular;  Laterality: N/A;   ROTATOR CUFF REPAIR Right    TONSILLECTOMY  1959     Objective:  Med list reviewed  Current Outpatient Medications:    acetaminophen (TYLENOL) 650 MG CR tablet, Take 650 mg by mouth every 8 (eight) hours as needed for pain., Disp: , Rfl:    acetaminophen-codeine (TYLENOL #3) 300-30 MG tablet, Take 1 tablet by mouth every 6 (six) hours as needed for moderate pain (pain score 4-6)., Disp: 30 tablet, Rfl: 0   albuterol (VENTOLIN HFA) 108 (90 Base) MCG/ACT inhaler, Inhale 2 puffs into the lungs every 6 (six) hours as needed for wheezing or shortness of breath., Disp: 8 g, Rfl: 0   amoxicillin-clavulanate (AUGMENTIN) 875-125 MG tablet, Take 1 tablet by mouth 2 (two) times daily., Disp: 14 tablet, Rfl: 0   Apoaequorin (PREVAGEN PO), Take by mouth., Disp: , Rfl:    cholecalciferol (VITAMIN D) 1000 UNITS tablet, Take 1,000 Units by mouth every evening. , Disp: , Rfl:    Continuous Glucose Sensor (FREESTYLE LIBRE 3 PLUS SENSOR) MISC, 1 each by Does not apply route every 14 (fourteen) days., Disp: 2 each, Rfl: 11   hydrochlorothiazide (HYDRODIURIL) 50 MG tablet, Take 1/2 (one-half) tablet by mouth twice daily, Disp: 90 tablet, Rfl: 1   HYDROcodone-acetaminophen (NORCO) 7.5-325 MG tablet, Take 1 tablet by mouth every 6 (six) hours as needed for moderate pain (pain score 4-6)., Disp:  20 tablet, Rfl: 0   insulin NPH Human (NOVOLIN N) 100 UNIT/ML injection, Inject 0.3 mLs (30 Units total) into the skin 2 (two) times daily before a meal. (Patient taking differently: Inject 40 Units into the skin 2 (two) times daily before a meal.), Disp: 20 mL, Rfl: 0   insulin regular (NOVOLIN R) 100 units/mL injection, Inject 15 Units into the skin 3 (three) times daily before meals., Disp: , Rfl:    metoprolol tartrate (LOPRESSOR) 50 MG tablet, Take 0.5 tablets (25 mg total) by mouth 2 (two) times daily., Disp: 90 tablet, Rfl: 3   Multiple Vitamins-Minerals (PRESERVISION AREDS PO), Take by mouth., Disp: , Rfl:    potassium chloride (KLOR-CON) 8 MEQ tablet, Take 8 mEq by mouth 2 (two) times daily., Disp: , Rfl:    PRESCRIPTION MEDICATION, Patient receives a Steroid injection in the neck once a year with Dr. Regino Schultze at [facility] pt is unable to recall the exact name and i have been unable to verify name from the Dr's office. Patient also states " it shoots up my blood sugars really high, but i still get it  because i can't take anything else for my neck pain", Disp: , Rfl:    tiZANidine (ZANAFLEX) 4 MG tablet, Take 1 tablet (4 mg total) by mouth every 6 (six) hours as needed for muscle spasms. (Patient taking differently: Take 2-4 mg by mouth every 6 (six) hours as needed for muscle spasms.), Disp: 30 tablet, Rfl: 3   Transparent Dressings (TEGADERM FILM 2-3/8"X2-3/4") MISC, 1 each by Does not apply route once a week. Put over glucometer sensor to keep in place, Disp: 20 each, Rfl: 3   Vital signs in last 24 hrs: Vitals:   03/25/23 1347  BP: 134/72  Pulse: 88   Wt Readings from Last 3 Encounters:  03/24/23 189 lb (85.7 kg)  01/08/23 184 lb (83.5 kg)  12/03/22 194 lb (88 kg)    Physical Exam  Antalgic gait, gets on exam table without assistance HEENT: sclera anicteric, oral mucosa moist without lesions Neck: supple, no thyromegaly, JVD or lymphadenopathy Cardiac: Regular without  appreciable murmur,  no peripheral edema Pulm: clear to auscultation bilaterally, normal RR and effort noted Abdomen: soft, no tenderness, with active bowel sounds. No guarding or palpable hepatosplenomegaly.   Labs:   ___________________________________________ Radiologic studies:   ____________________________________________ Other:   _____________________________________________   Encounter Diagnoses  Name Primary?   Lower abdominal pain Yes   Abdominal bloating     Assessment and Plan    Chronic Constipation Improved with discontinuation of Ozempic and initiation of Miralax. Patient self-adjusting dose based on symptoms. No recent abdominal pain. -Continue current regimen of Miralax as needed. -Consider reducing dose to half a capful if bowel movements become too frequent.  Regulating her bowels seems to have decreased the abdominal pain and episodes that were previously concerning for diverticulitis.  Continuing current management plan, dose adjust MiraLAX as needed, seemingly needed   [name]  Record review addendum 05/07/2023  Colonoscopy and pathology results from [facility] (Dr. Marca Ancona) September 2021 received and reviewed. Diverticulosis, redundant colon, 5 subcentimeter tubular adenomas.  Portal message sent to patient today with that update for her to consider when she feels ready to have a surveillance colonoscopy.  [name]

## 2023-03-26 LAB — URINE CULTURE
MICRO NUMBER:: 16092962
SPECIMEN QUALITY:: ADEQUATE

## 2023-03-27 ENCOUNTER — Encounter: Payer: Self-pay | Admitting: Physician Assistant

## 2023-05-05 ENCOUNTER — Ambulatory Visit (INDEPENDENT_AMBULATORY_CARE_PROVIDER_SITE_OTHER): Payer: Medicare (Managed Care) | Admitting: Physician Assistant

## 2023-05-05 ENCOUNTER — Ambulatory Visit (HOSPITAL_COMMUNITY)
Admission: RE | Admit: 2023-05-05 | Discharge: 2023-05-05 | Disposition: A | Discharge status: Home / Self Care | Payer: Medicare (Managed Care) | Source: Ambulatory Visit | Attending: Physician Assistant | Admitting: Physician Assistant

## 2023-05-05 ENCOUNTER — Encounter: Payer: Self-pay | Admitting: Physician Assistant

## 2023-05-05 VITALS — BP 120/70 | HR 70 | Temp 97.5°F | Ht 64.25 in

## 2023-05-05 DIAGNOSIS — M7989 Other specified soft tissue disorders: Secondary | ICD-10-CM | POA: Insufficient documentation

## 2023-05-05 DIAGNOSIS — M79661 Pain in right lower leg: Secondary | ICD-10-CM

## 2023-05-05 DIAGNOSIS — R300 Dysuria: Secondary | ICD-10-CM | POA: Diagnosis not present

## 2023-05-05 LAB — POCT URINALYSIS DIPSTICK
Bilirubin, UA: POSITIVE
Blood, UA: POSITIVE
Glucose, UA: POSITIVE — AB
Ketones, UA: NEGATIVE
Nitrite, UA: POSITIVE
Protein, UA: NEGATIVE
Spec Grav, UA: 1.02 (ref 1.010–1.025)
Urobilinogen, UA: 2 U/dL — AB
pH, UA: 6 (ref 5.0–8.0)

## 2023-05-05 MED ORDER — AMOXICILLIN-POT CLAVULANATE 875-125 MG PO TABS: 1.0000 | ORAL_TABLET | Freq: Two times a day (BID) | ORAL | 0 refills | Status: DC

## 2023-05-05 NOTE — Patient Instructions (Signed)
 It was great to see you!  We will schedule ultrasound of your leg -- someone will call you to schedule If chest pain, shortness of breath or other worsening symptom(s) in the meantime, please go to the ER  Start Augmentin We will be in touch with your urine culture results  General instructions Make sure you: Pee until your bladder is empty. Do not hold pee for a long time. Empty your bladder after sex. Wipe from front to back after pooping if you are a female. Use each tissue one time when you wipe. Drink enough fluid to keep your pee pale yellow. Keep all follow-up visits as told by your doctor. This is important. Contact a doctor if: You do not get better after 1-2 days. Your symptoms go away and then come back. Get help right away if: You have very bad back pain. You have very bad pain in your lower belly. You have a fever. You are sick to your stomach (nauseous). You are throwing up.   Take care,  Jarold Motto PA-C

## 2023-05-05 NOTE — Progress Notes (Signed)
 Lower extremity venous duplex completed. Please see CV Procedures for preliminary results.  Shona Simpson, RVT 05/05/23 1:33 PM

## 2023-05-05 NOTE — Progress Notes (Signed)
 [name] is a 73 y.o. female here for a recurrence of a previously resolved problem.  History of Present Illness:   Chief Complaint  Patient presents with   Dysuria    Pt c/o going frequently in small amounts on Saturday and started burning this morning, took Azo.    HPI  Dysuria: Pt complains of dysuria, frequency, and decreased urine output, starting Saturday.  She also endorses burning sensation started this morning.  Pt reports increased stress due to being without water and heat for about 3 weeks.  She was boiling water to shower and take baths.  She did take a bubble bath with epsom salt  Pt reports wiping front to back after using the bathroom.  She has been taking AZO.  Denies any sexual activity.  Denies holding her bladder, states she works from home.   Leg swelling: Pt complains of right leg swelling with associated pain and tightness.  Pt has hx of blood clots in 02/2022 and was put on Xarelto for about 3 months. She said that this was due to immobilization from being ill. States she had a similar presentation when she had her last blood clot.  Typically she wears compression socks daily, but did not wear today.  She notes having a "shredded tendon" in her right leg.   Past Medical History:  Diagnosis Date   Arthritis    C. difficile colitis    Diabetes mellitus    Diverticulitis    Fibromyalgia    Hypercholesteremia    Hypertension    IBS (irritable bowel syndrome)    Migraines    Nasal fracture    Stein-Leventhal ovaries      Social History   Tobacco Use   Smoking status: Never   Smokeless tobacco: Never  Vaping Use   Vaping status: Never Used  Substance Use Topics   Alcohol use: Yes    Comment: rare-once a year at a celebration   Drug use: No    Past Surgical History:  Procedure Laterality Date   CHOLECYSTECTOMY  1998   KNEE ARTHROSCOPY Right 2017   KNEE ARTHROSCOPY Left    LEFT HEART CATHETERIZATION WITH CORONARY ANGIOGRAM N/A  12/19/2011   Procedure: LEFT HEART CATHETERIZATION WITH CORONARY ANGIOGRAM;  Surgeon: Donato Schultz, MD;  Location: [facility] CATH LAB;  Service: Cardiovascular;  Laterality: N/A;   ROTATOR CUFF REPAIR Right    TONSILLECTOMY  1959    Family History  Adopted: Yes  Problem Relation Age of Onset   Failure to thrive Mother    Heart failure Father    Neuropathy Neg Hx     Allergies  Allergen Reactions   Ceftriaxone Hives, Itching and Other (See Comments)   Dicyclomine Hives    Hives, itching, swelling, vomiting, felt like something was sitting on chest   Erythromycin Anaphylaxis and Other (See Comments)   Iodinated Contrast Media Hives, Other (See Comments) and Shortness Of Breath   Canagliflozin Rash and Other (See Comments)    Dehydration Dehydration Dehydration    Statins Other (See Comments)    Severe muscle pain/weakness Muscle pain/ weekness    Sulfa Antibiotics Other (See Comments)    blackout PER PT SHE BLACKS OUT    Corticosteroids Other (See Comments)   Insulin Glargine Other (See Comments)    Severe ankle swelling   Metformin And Related Diarrhea and Nausea And Vomiting    Violently ill for 2 yrs   Metformin Hcl Other (See Comments)   Scallops [Shellfish Allergy] Diarrhea  and Nausea And Vomiting    Severe GI upset   Tramadol Hcl Other (See Comments)   Cipro [Ciprofloxacin Hcl] Rash    10/29-new rash   Nsaids Other (See Comments)    Stomach problems Stomach problems    Respiratory Syncytial Virus Mrna Vaccine Rash    Left Swelling arm, hot, fatigue   Tramadol Rash    Current Medications:   Current Outpatient Medications:    acetaminophen (TYLENOL) 650 MG CR tablet, Take 650 mg by mouth every 8 (eight) hours as needed for pain., Disp: , Rfl:    acetaminophen-codeine (TYLENOL #3) 300-30 MG tablet, Take 1 tablet by mouth every 6 (six) hours as needed for moderate pain (pain score 4-6)., Disp: 30 tablet, Rfl: 0   albuterol (VENTOLIN HFA) 108 (90 Base) MCG/ACT  inhaler, Inhale 2 puffs into the lungs every 6 (six) hours as needed for wheezing or shortness of breath., Disp: 8 g, Rfl: 0   amoxicillin-clavulanate (AUGMENTIN) 875-125 MG tablet, Take 1 tablet by mouth 2 (two) times daily., Disp: 14 tablet, Rfl: 0   Apoaequorin (PREVAGEN PO), Take by mouth., Disp: , Rfl:    cholecalciferol (VITAMIN D) 1000 UNITS tablet, Take 1,000 Units by mouth every evening. , Disp: , Rfl:    Continuous Glucose Sensor (FREESTYLE LIBRE 3 PLUS SENSOR) MISC, 1 each by Does not apply route every 14 (fourteen) days., Disp: 2 each, Rfl: 11   hydrochlorothiazide (HYDRODIURIL) 50 MG tablet, Take 1/2 (one-half) tablet by mouth twice daily, Disp: 90 tablet, Rfl: 1   HYDROcodone-acetaminophen (NORCO) 7.5-325 MG tablet, Take 1 tablet by mouth every 6 (six) hours as needed for moderate pain (pain score 4-6)., Disp: 20 tablet, Rfl: 0   insulin NPH Human (NOVOLIN N) 100 UNIT/ML injection, Inject 0.3 mLs (30 Units total) into the skin 2 (two) times daily before a meal. (Patient taking differently: Inject 40 Units into the skin 2 (two) times daily before a meal.), Disp: 20 mL, Rfl: 0   insulin regular (NOVOLIN R) 100 units/mL injection, Inject 15 Units into the skin 3 (three) times daily before meals., Disp: , Rfl:    metoprolol tartrate (LOPRESSOR) 50 MG tablet, Take 0.5 tablets (25 mg total) by mouth 2 (two) times daily., Disp: 90 tablet, Rfl: 3   Multiple Vitamins-Minerals (PRESERVISION AREDS PO), Take by mouth., Disp: , Rfl:    potassium chloride (KLOR-CON) 8 MEQ tablet, Take 8 mEq by mouth 2 (two) times daily., Disp: , Rfl:    PRESCRIPTION MEDICATION, Patient receives a Steroid injection in the neck once a year with Dr. Regino Schultze at [facility] pt is unable to recall the exact name and i have been unable to verify name from the Dr's office. Patient also states " it shoots up my blood sugars really high, but i still get it because i can't take anything else for my neck pain", Disp: , Rfl:     tiZANidine (ZANAFLEX) 4 MG tablet, Take 1 tablet (4 mg total) by mouth every 6 (six) hours as needed for muscle spasms. (Patient taking differently: Take 2-4 mg by mouth every 6 (six) hours as needed for muscle spasms.), Disp: 30 tablet, Rfl: 3   Transparent Dressings (TEGADERM FILM 2-3/8"X2-3/4") MISC, 1 each by Does not apply route once a week. Put over glucometer sensor to keep in place, Disp: 20 each, Rfl: 3   Review of Systems:   Negative unless otherwise specified per HPI.  Vitals:   Vitals:   05/05/23 1111  BP: 120/70  Pulse:  70  Temp: (!) 97.5 F (36.4 C)  TempSrc: Temporal  SpO2: 98%  Height: 5' 4.25" (1.632 m)     Body mass index is 32.19 kg/m.  Physical Exam:   Physical Exam Vitals and nursing note reviewed.  Constitutional:      General: She is not in acute distress.    Appearance: She is well-developed. She is not ill-appearing or toxic-appearing.  Cardiovascular:     Rate and Rhythm: Normal rate and regular rhythm.     Pulses: Normal pulses.     Heart sounds: Normal heart sounds, S1 normal and S2 normal.  Pulmonary:     Effort: Pulmonary effort is normal.     Breath sounds: Normal breath sounds.  Abdominal:     Tenderness: There is no right CVA tenderness or left CVA tenderness.  Musculoskeletal:     Comments: R foot, lateral ankle and calf with generalized edema Point tenderness to two distinct areas on calf both with significant cluster of spider veins  Right calf approximately 2 cm > left calf   Skin:    General: Skin is warm and dry.  Neurological:     Mental Status: She is alert.     GCS: GCS eye subscore is 4. GCS verbal subscore is 5. GCS motor subscore is 6.  Psychiatric:        Speech: Speech normal.        Behavior: Behavior normal. Behavior is cooperative.    Results for orders placed or performed in visit on 05/05/23  POCT urinalysis dipstick  Result Value Ref Range   Color, UA Orange    Clarity, UA Cluoudy    Glucose, UA Positive  (A) Negative   Bilirubin, UA Positive    Ketones, UA Negaitve    Spec Grav, UA 1.020 1.010 - 1.025   Blood, UA Positive    pH, UA 6.0 5.0 - 8.0   Protein, UA Negative Negative   Urobilinogen, UA 2.0 (A) 0.2 or 1.0 E.U./dL   Nitrite, UA Positive    Leukocytes, UA Large (3+) (A) Negative   Appearance     Odor      Assessment and Plan:   1. Dysuria (Primary) No red flag symptom(s) Due to recent history of confirmed positive urine cultures, will go ahead and empirically treat with keflex until culture has returned (she took azo which has skewed our Point of Care (POC) results) Continue efforts at pushing fluids  If new/worsening symptom(s), reach out Will adjust treatment(s) based on culture results - POCT urinalysis dipstick - Urine Culture  2. Pain and swelling of lower leg, right Due to swelling, pain and DVT history, we must obtain ultrasound for further evaluation Follow up based on results She was instructed that if any chest pain, shortness of breath, or new symptom(s), needs to go to the ER in the meantime - VAS [place] LOWER EXTREMITY VENOUS (DVT); Future    I, Isabelle Course, acting as a Neurosurgeon for Jarold Motto, [place]., have documented all relevant documentation on the behalf of Jarold Motto, [place], as directed by  Jarold Motto, PA while in the presence of Jarold Motto, [place].  I, Jarold Motto, [place], have reviewed all documentation for this visit. The documentation on 05/05/23 for the exam, diagnosis, procedures, and orders are all accurate and complete.  Jarold Motto, PA-C

## 2023-05-07 ENCOUNTER — Encounter: Payer: Self-pay | Admitting: Gastroenterology

## 2023-05-07 ENCOUNTER — Encounter: Payer: Self-pay | Admitting: Physician Assistant

## 2023-05-07 ENCOUNTER — Other Ambulatory Visit: Payer: Self-pay | Admitting: Family Medicine

## 2023-05-07 DIAGNOSIS — M5412 Radiculopathy, cervical region: Secondary | ICD-10-CM

## 2023-05-07 LAB — URINE CULTURE
MICRO NUMBER:: 16268126
SPECIMEN QUALITY:: ADEQUATE

## 2023-05-13 DIAGNOSIS — M47816 Spondylosis without myelopathy or radiculopathy, lumbar region: Secondary | ICD-10-CM | POA: Diagnosis not present

## 2023-05-21 DIAGNOSIS — J302 Other seasonal allergic rhinitis: Secondary | ICD-10-CM | POA: Insufficient documentation

## 2023-05-21 DIAGNOSIS — E66811 Obesity, class 1: Secondary | ICD-10-CM | POA: Insufficient documentation

## 2023-05-21 DIAGNOSIS — Z8719 Personal history of other diseases of the digestive system: Secondary | ICD-10-CM | POA: Insufficient documentation

## 2023-05-26 ENCOUNTER — Telehealth: Payer: Self-pay

## 2023-05-26 ENCOUNTER — Encounter: Payer: Self-pay | Admitting: Family Medicine

## 2023-05-26 ENCOUNTER — Ambulatory Visit: Payer: Self-pay

## 2023-05-26 ENCOUNTER — Other Ambulatory Visit (HOSPITAL_COMMUNITY): Payer: Self-pay

## 2023-05-26 ENCOUNTER — Ambulatory Visit (INDEPENDENT_AMBULATORY_CARE_PROVIDER_SITE_OTHER): Payer: Medicare (Managed Care) | Admitting: Family Medicine

## 2023-05-26 VITALS — BP 138/63 | HR 79 | Temp 97.5°F | Resp 16 | Ht 64.25 in

## 2023-05-26 DIAGNOSIS — R1030 Lower abdominal pain, unspecified: Secondary | ICD-10-CM

## 2023-05-26 MED ORDER — AMOXICILLIN-POT CLAVULANATE 875-125 MG PO TABS: 1.0000 | ORAL_TABLET | Freq: Two times a day (BID) | ORAL | 0 refills | Status: DC

## 2023-05-26 MED ORDER — PROMETHAZINE HCL 25 MG PO TABS: 25.0000 mg | ORAL_TABLET | Freq: Three times a day (TID) | ORAL | 1 refills | Status: AC | PRN

## 2023-05-26 NOTE — Telephone Encounter (Signed)
 Patient seen on 05/26/23 at 2 pm to discuss sx.

## 2023-05-26 NOTE — Telephone Encounter (Signed)
 Copied from CRM [id number]. Topic: Clinical - Red Word Triage >> May 26, 2023  9:29 AM [employer] wrote: Red Word that prompted transfer to Nurse Triage: Patient believes her Diverticulitis is back. Pain level is 8. Lower abdomen stomach pain. Feeling Nauseous.  Chief Complaint: Abdominal pain Symptoms: Nausea Frequency: Weekend, worsened today Pertinent Negatives: Patient denies vomiting Disposition: [] ED /[x] Urgent Care (no appt availability in office) / [] Appointment(In office/virtual)/ []  [facility]/ [] Home Care/ [x] Refused Recommended Disposition /[] [employer] [facility]/ []  Follow-up with PCP Additional Notes: Patient called in to report a flare-up of abdominal pain. Patient stated pain started intermittently over the weekend and intensified this morning. Patient has a history of diverticulitis. Advised UC today, due to no appointment in the office. Patient declined and stated a provider typically prescribes an antibiotic whenever this happens. Patient is requesting medication to be prescribed without an appointment. Advised that I would route this conversation to office for their discretion. Provided care advice and instructed patient to call back if symptoms worsen. Patient complied. Patient requesting call back from office.   Reason for Disposition  [1] MILD-MODERATE pain AND [2] constant AND [3] present > 2 hours  Answer Assessment - Initial Assessment Questions 1. LOCATION: "Where does it hurt?"      Lower abdomen, left side is worse  2. RADIATION: "Does the pain shoot anywhere else?" (e.g., chest, back)     Localized to abdomen 3. ONSET: "When did the pain begin?" (e.g., minutes, hours or days ago)      Intermittent over the weekend, worsened this morning 4. SUDDEN: "Gradual or sudden onset?"     Gradual, intermittent at first 5. PATTERN "Does the pain come and go, or is it constant?"    - If it comes and goes: "How long does it last?" "Do you have pain now?"      (Note: Comes and goes means the pain is intermittent. It goes away completely between bouts.)    - If constant: "Is it getting better, staying the same, or getting worse?"      (Note: Constant means the pain never goes away completely; most serious pain is constant and gets worse.)      Constant 6. SEVERITY: "How bad is the pain?"  (e.g., Scale 1-10; mild, moderate, or severe)    - MILD (1-3): Doesn't interfere with normal activities, abdomen soft and not tender to touch.     - MODERATE (4-7): Interferes with normal activities or awakens from sleep, abdomen tender to touch.     - SEVERE (8-10): Excruciating pain, doubled over, unable to do any normal activities.       Rates pain an 8, states pain is constant at this point, crampy and sharp pains 7. RECURRENT SYMPTOM: "Have you ever had this type of stomach pain before?" If Yes, ask: "When was the last time?" and "What happened that time?"      Yes, states it has been "awhile" 8. CAUSE: "What do you think is causing the stomach pain?"     Diverticulitis flare-up 9. RELIEVING/AGGRAVATING FACTORS: "What makes it better or worse?" (e.g., antacids, bending or twisting motion, bowel movement)     Bland diet helps 10. OTHER SYMPTOMS: "Do you have any other symptoms?" (e.g., back pain, diarrhea, fever, urination pain, vomiting)     Nausea  Protocols used: Abdominal Pain - Female-A-AH

## 2023-05-26 NOTE — Progress Notes (Signed)
 Subjective:     Patient ID: [name], female    DOB: [birth year], 73 y.o.   MRN: [id number]  Chief Complaint  Patient presents with   Abdominal Pain    Lower abdominal pain that started Friday, took Tylenol  and Advil, has helped with pain some Nausea started Saturday Possible diverticulitis    HPI Discussed the use of AI scribe software for clinical note transcription with the patient, who gave verbal consent to proceed.  History of Present Illness [name] is a 72 year old female with diverticulitis and diabetes who presents with abdominal pain and nausea.  She has been experiencing abdominal cramping since Friday, May 23, 2023. The pain is located in the lower abdomen, predominantly on the left side, but can spread when severe. She has adjusted her diet to bland foods, consuming small portions and increasing fluid intake. She reports frequent bowel movements, approximately five to six times daily, and has not used Miralax during this episode.  Over the weekend, she experienced worsening pain and constant nausea, though she has not vomited. She recalls being prescribed ondansetron  for nausea after a previous hospital visit, but it was ineffective, and she no longer has the medication. She has taken Tylenol  and Advil for pain relief.  She was treated with Augmentin  for a urinary tract infection (UTI) on May 05, 2023, and reports that the UTI symptoms resolved. She expresses concern that her current abdominal pain might not be related to a UTI recurrence. No fever or burning during urination since completing the Augmentin  course.  She has a history of C. difficile infection and mentions that a previous doctor questioned whether her past episodes were truly diverticulitis, suggesting the possibility of segmental colitis associated with diverticulosis (SCAD). She declined a repeat colonoscopy at that time.  She mentions significant stress due to recent personal  circumstances, including living without heat and hot water for 17 days, which she believes may have exacerbated her symptoms. She works in [employer] and notes that the end of tax season has not been particularly stressful for her. Abdominal spasms sometimes radiate to her spine, causing additional discomfort.    Health Maintenance Due  Topic Date Due   Medicare Annual Wellness (AWV)  Never done   FOOT EXAM  02/21/2023   OPHTHALMOLOGY EXAM  05/07/2023    Past Medical History:  Diagnosis Date   Arthritis    C. difficile colitis    Diabetes mellitus    Diverticulitis    Fibromyalgia    Hypercholesteremia    Hypertension    IBS (irritable bowel syndrome)    Migraines    Nasal fracture    Stein-Leventhal ovaries     Past Surgical History:  Procedure Laterality Date   CHOLECYSTECTOMY  1998   KNEE ARTHROSCOPY Right 2017   KNEE ARTHROSCOPY Left    LEFT HEART CATHETERIZATION WITH CORONARY ANGIOGRAM N/A 12/19/2011   Procedure: LEFT HEART CATHETERIZATION WITH CORONARY ANGIOGRAM;  Surgeon: Dorothye Gathers, MD;  Location: [facility] CATH LAB;  Service: Cardiovascular;  Laterality: N/A;   ROTATOR CUFF REPAIR Right    TONSILLECTOMY  1959     Current Outpatient Medications:    acetaminophen  (TYLENOL ) 650 MG CR tablet, Take 650 mg by mouth every 8 (eight) hours as needed for pain., Disp: , Rfl:    acetaminophen -codeine  (TYLENOL  #3) 300-30 MG tablet, Take 1 tablet by mouth every 6 (six) hours as needed for moderate pain (pain score 4-6)., Disp: 30 tablet, Rfl: 0   albuterol  (VENTOLIN   HFA) 108 (90 Base) MCG/ACT inhaler, Inhale 2 puffs into the lungs every 6 (six) hours as needed for wheezing or shortness of breath., Disp: 8 g, Rfl: 0   Apoaequorin (PREVAGEN PO), Take by mouth., Disp: , Rfl:    cholecalciferol (VITAMIN D) 1000 UNITS tablet, Take 1,000 Units by mouth every evening. , Disp: , Rfl:    Continuous Glucose Sensor (FREESTYLE LIBRE 3 PLUS SENSOR) MISC, 1 each by Does not apply route every 14  (fourteen) days., Disp: 2 each, Rfl: 11   hydrochlorothiazide  (HYDRODIURIL ) 50 MG tablet, Take 1/2 (one-half) tablet by mouth twice daily, Disp: 90 tablet, Rfl: 1   HYDROcodone -acetaminophen  (NORCO) 7.5-325 MG tablet, Take 1 tablet by mouth every 6 (six) hours as needed for moderate pain (pain score 4-6)., Disp: 20 tablet, Rfl: 0   insulin  NPH Human (NOVOLIN N) 100 UNIT/ML injection, Inject 0.3 mLs (30 Units total) into the skin 2 (two) times daily before a meal. (Patient taking differently: Inject 40 Units into the skin 2 (two) times daily before a meal.), Disp: 20 mL, Rfl: 0   insulin  regular (NOVOLIN R) 100 units/mL injection, Inject 15 Units into the skin 3 (three) times daily before meals., Disp: , Rfl:    metoprolol  tartrate (LOPRESSOR ) 50 MG tablet, Take 0.5 tablets (25 mg total) by mouth 2 (two) times daily., Disp: 90 tablet, Rfl: 3   Multiple Vitamins-Minerals (PRESERVISION AREDS PO), Take by mouth., Disp: , Rfl:    potassium chloride  (KLOR-CON ) 8 MEQ tablet, Take 8 mEq by mouth 2 (two) times daily., Disp: , Rfl:    PRESCRIPTION MEDICATION, Patient receives a Steroid injection in the neck once a year with Dr. Donna Fus at [facility] pt is unable to recall the exact name and i have been unable to verify name from the Dr's office. Patient also states " it shoots up my blood sugars really high, but i still get it because i can't take anything else for my neck pain", Disp: , Rfl:    promethazine  (PHENERGAN ) 25 MG tablet, Take 1 tablet (25 mg total) by mouth every 8 (eight) hours as needed for nausea or vomiting., Disp: 20 tablet, Rfl: 1   tiZANidine  (ZANAFLEX ) 4 MG tablet, Take 1 tablet (4 mg total) by mouth every 6 (six) hours as needed for muscle spasms. (Patient taking differently: Take 2-4 mg by mouth every 6 (six) hours as needed for muscle spasms.), Disp: 30 tablet, Rfl: 3   Transparent Dressings (TEGADERM FILM 2-3/8"X2-3/4") MISC, 1 each by Does not apply route once a week. Put over  glucometer sensor to keep in place, Disp: 20 each, Rfl: 3   amoxicillin -clavulanate (AUGMENTIN ) 875-125 MG tablet, Take 1 tablet by mouth 2 (two) times daily., Disp: 20 tablet, Rfl: 0  Allergies  Allergen Reactions   Ceftriaxone  Hives, Itching and Other (See Comments)   Dicyclomine  Hives    Hives, itching, swelling, vomiting, felt like something was sitting on chest   Erythromycin Anaphylaxis and Other (See Comments)   Iodinated Contrast Media Hives, Other (See Comments) and Shortness Of Breath   Canagliflozin Rash and Other (See Comments)    Dehydration Dehydration Dehydration    Statins Other (See Comments)    Severe muscle pain/weakness Muscle pain/ weekness    Sulfa Antibiotics Other (See Comments)    blackout PER PT SHE BLACKS OUT    Corticosteroids Other (See Comments)   Insulin  Glargine Other (See Comments)    Severe ankle swelling   Metformin And Related Diarrhea and Nausea And  Vomiting    Violently ill for 2 yrs   Metformin Hcl Other (See Comments)   Scallops [Shellfish Allergy] Diarrhea and Nausea And Vomiting    Severe GI upset   Tramadol Hcl Other (See Comments)   Cipro  [Ciprofloxacin  Hcl] Rash    10/29-new rash   Nsaids Other (See Comments)    Stomach problems Stomach problems    Respiratory Syncytial Virus Mrna Vaccine Rash    Left Swelling arm, hot, fatigue   Tramadol Rash   ROS neg/noncontributory except as noted HPI/below      Objective:     BP 138/63   Pulse 79   Temp (!) 97.5 F (36.4 C) (Temporal)   Resp 16   Ht 5' 4.25" (1.632 m)   SpO2 97%   BMI 32.19 kg/m  Wt Readings from Last 3 Encounters:  03/24/23 189 lb (85.7 kg)  01/08/23 184 lb (83.5 kg)  12/03/22 194 lb (88 kg)    Physical Exam   Gen: WDWN NAD HEENT: NCAT, conjunctiva not injected, sclera nonicteric ABDOMEN:  BS+, soft,diffusely, mild tenderness, No HSM, no masses EXT: compression stockings MSK: no gross abnormalities.  NEURO: A&O x3.  CN II-XII intact.  PSYCH:  normal mood. Good eye contact     Assessment & Plan:  Lower abdominal pain  Other orders -     Amoxicillin -Pot Clavulanate; Take 1 tablet by mouth 2 (two) times daily.  Dispense: 20 tablet; Refill: 0 -     Promethazine  HCl; Take 1 tablet (25 mg total) by mouth every 8 (eight) hours as needed for nausea or vomiting.  Dispense: 20 tablet; Refill: 1  Assessment and Plan Assessment & Plan Diverticulitis   Recurrent abdominal pain with cramping in the lower left abdomen has been present since May 23, 2023, accompanied by nausea but no vomiting or fever. 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 Stress and recent life events may contribute to symptoms. Differential diagnosis includes irritable bowel syndrome and recurrence of diverticulitis. Due to a history of C. Diff, cautious consideration of antibiotic use is necessary. . Consider starting Augmentin  if symptoms do not improve. Contact Dr. Dominic Friendly if symptoms worsen or for further guidance. A CT scan may be considered if symptoms persist or worsen.  Urinary Tract Infection (UTI)   A recent UTI was treated with Augmentin  on May 05, 2023. There are no current symptoms such as dysuria, and the current abdominal pain is not suspected to be related to a UTI.  C. Diff   There is a history of C. Diff infection, so caution is advised with antibiotic use. Current symptoms do not suggest a recurrence.  Diabetes   No specific discussion regarding diabetes management or complications occurred during this visit.  Stress Management   Stress management and its impact on gastrointestinal symptoms were discussed. Recent stressors include significant home repair and financial strain. Encourage stress management techniques to help alleviate gastrointestinal symptoms.  Follow-up   Follow-up plans were discussed in the context of current symptoms and the potential need for further evaluation. Follow up if symptoms do not improve  or worsen after 1-2 days. Consider contacting Dr. Dominic Friendly for further evaluation if necessary.    Return if symptoms worsen or fail to improve.  Ellsworth Haas, MD

## 2023-05-26 NOTE — Telephone Encounter (Signed)
 Please see message below and advise.

## 2023-05-26 NOTE — Telephone Encounter (Signed)
 Pharmacy Patient Advocate Encounter   Received notification from CoverMyMeds that prior authorization for  Promethazine  HCl 25MG  tablets is required/requested.   Insurance verification completed.   The patient is insured through [employer] .   Per test claim: PA required; PA submitted to above mentioned insurance via CoverMyMeds Key/confirmation #/EOC [id number] Status is pending

## 2023-05-26 NOTE — Patient Instructions (Signed)
 Augmentin sent.

## 2023-05-27 ENCOUNTER — Other Ambulatory Visit (HOSPITAL_COMMUNITY): Payer: Self-pay

## 2023-06-03 NOTE — Telephone Encounter (Signed)
 Pharmacy Patient Advocate Encounter  Received notification from [employer] that Prior Authorization for Promethazine  HCl 25MG  tablets has been APPROVED from 04-26-2023 to 05-25-2024   PA #/Case ID/Reference #: [id number]

## 2023-06-03 NOTE — Telephone Encounter (Signed)
 Noted.

## 2023-06-11 ENCOUNTER — Encounter: Payer: Self-pay | Admitting: Family Medicine

## 2023-06-11 ENCOUNTER — Ambulatory Visit (INDEPENDENT_AMBULATORY_CARE_PROVIDER_SITE_OTHER): Payer: Medicare (Managed Care) | Admitting: Family Medicine

## 2023-06-11 VITALS — BP 126/88 | HR 77 | Temp 97.0°F | Resp 18 | Ht 64.25 in

## 2023-06-11 DIAGNOSIS — G43009 Migraine without aura, not intractable, without status migrainosus: Secondary | ICD-10-CM | POA: Diagnosis not present

## 2023-06-11 DIAGNOSIS — E1165 Type 2 diabetes mellitus with hyperglycemia: Secondary | ICD-10-CM

## 2023-06-11 DIAGNOSIS — Z8719 Personal history of other diseases of the digestive system: Secondary | ICD-10-CM | POA: Diagnosis not present

## 2023-06-11 DIAGNOSIS — M5412 Radiculopathy, cervical region: Secondary | ICD-10-CM | POA: Diagnosis not present

## 2023-06-11 DIAGNOSIS — E782 Mixed hyperlipidemia: Secondary | ICD-10-CM | POA: Diagnosis not present

## 2023-06-11 DIAGNOSIS — E2839 Other primary ovarian failure: Secondary | ICD-10-CM

## 2023-06-11 DIAGNOSIS — G720 Drug-induced myopathy: Secondary | ICD-10-CM

## 2023-06-11 DIAGNOSIS — I10 Essential (primary) hypertension: Secondary | ICD-10-CM

## 2023-06-11 DIAGNOSIS — Z794 Long term (current) use of insulin: Secondary | ICD-10-CM | POA: Diagnosis not present

## 2023-06-11 LAB — COMPREHENSIVE METABOLIC PANEL WITH GFR
ALT: 10 U/L (ref 0–35)
AST: 12 U/L (ref 0–37)
Albumin: 4 g/dL (ref 3.5–5.2)
Alkaline Phosphatase: 90 U/L (ref 39–117)
BUN: 20 mg/dL (ref 6–23)
CO2: 30 meq/L (ref 19–32)
Calcium: 9.4 mg/dL (ref 8.4–10.5)
Chloride: 95 meq/L — ABNORMAL LOW (ref 96–112)
Creatinine, Ser: 0.95 mg/dL (ref 0.40–1.20)
GFR: 59.13 mL/min — ABNORMAL LOW (ref >60.00)
Glucose, Bld: 324 mg/dL — ABNORMAL HIGH (ref 70–99)
Potassium: 3.7 meq/L (ref 3.5–5.1)
Sodium: 136 meq/L (ref 135–145)
Total Bilirubin: 0.7 mg/dL (ref 0.2–1.2)
Total Protein: 7.1 g/dL (ref 6.0–8.3)

## 2023-06-11 LAB — LIPID PANEL
Cholesterol: 223 mg/dL — ABNORMAL HIGH (ref 0–200)
HDL: 45.5 mg/dL (ref >39.00)
LDL Cholesterol: 141 mg/dL — ABNORMAL HIGH (ref 0–99)
NonHDL: 177.52
Total CHOL/HDL Ratio: 5
Triglycerides: 185 mg/dL — ABNORMAL HIGH (ref 0.0–149.0)
VLDL: 37 mg/dL (ref 0.0–40.0)

## 2023-06-11 LAB — MAGNESIUM: Magnesium: 1.6 mg/dL (ref 1.5–2.5)

## 2023-06-11 LAB — HEMOGLOBIN A1C: Hgb A1c MFr Bld: 10.5 % — ABNORMAL HIGH (ref 4.6–6.5)

## 2023-06-11 LAB — CBC WITH DIFFERENTIAL/PLATELET
Basophils Absolute: 0.1 10*3/uL (ref 0.0–0.1)
Basophils Relative: 1.1 % (ref 0.0–3.0)
Eosinophils Absolute: 0.2 10*3/uL (ref 0.0–0.7)
Eosinophils Relative: 3 % (ref 0.0–5.0)
HCT: 40.5 % (ref 36.0–46.0)
Hemoglobin: 13.8 g/dL (ref 12.0–15.0)
Lymphocytes Relative: 29.1 % (ref 12.0–46.0)
Lymphs Abs: 2.3 10*3/uL (ref 0.7–4.0)
MCHC: 34.2 g/dL (ref 30.0–36.0)
MCV: 89.8 fl (ref 78.0–100.0)
Monocytes Absolute: 0.7 10*3/uL (ref 0.1–1.0)
Monocytes Relative: 9.2 % (ref 3.0–12.0)
Neutro Abs: 4.5 10*3/uL (ref 1.4–7.7)
Neutrophils Relative %: 57.6 % (ref 43.0–77.0)
Platelets: 277 10*3/uL (ref 150.0–400.0)
RBC: 4.51 Mil/uL (ref 3.87–5.11)
RDW: 13.4 % (ref 11.5–15.5)
WBC: 7.9 10*3/uL (ref 4.0–10.5)

## 2023-06-11 LAB — TSH: TSH: 1.7 u[IU]/mL (ref 0.35–5.50)

## 2023-06-11 NOTE — Progress Notes (Signed)
 Bempedoic acid 180mg  daily #30/3-if willing to try for cholesterol Rest of labs same as always/expected-may need to increase the long and short acting insulin  by a few units

## 2023-06-11 NOTE — Progress Notes (Signed)
 Subjective:     Patient ID: [name], female    DOB: 1950-11-19, 73 y.o.   MRN: [id number]  Chief Complaint  Patient presents with   Medical Management of Chronic Issues    3 month follow-up Fasting     HPI Discussed the use of AI scribe software for clinical note transcription with the patient, who gave verbal consent to proceed.  History of Present Illness [name] is a 73 year old female with diverticulitis and diabetes who presents with ongoing abdominal discomfort and blood sugar management issues.  She completed a course of Augmentin  for diverticulitis, which alleviated the constant abdominal pain after a few days. However, she still experiences intermittent discomfort, particularly after eating certain foods. The abdominal pain is no longer constant.  Upon starting Augmentin , her sinus issues worsened, and she developed a persistent cough. She has used an inhaler a few times to manage the cough, which she finds aggravating. No chest pain, shortness of breath, rash, throat tightness, or heartburn associated with Augmentin  use.  Her diabetes management is challenging, with blood sugar levels fluctuating significantly. She takes 40 units of N insulin  twice a day and 15 units of R insulin  twice a day, but often misses the midday dose due to her schedule. A recent blood sugar level was 307 mg/dL in the morning after running out of insulin . She experiences hypoglycemic episodes, such as a drop to 69 mg/dL at night, often after having cereal for dinner without sufficient protein intake. Struggles w/diet as chronic pain-doesn't feel like cooking and will often eat "comfort foods"  She experiences significant pain in her spine, particularly on the left side, which affects her mobility and contributes to dietary choices for comfort. She avoids using an electric cart at stores to maintain some physical activity, despite the pain.  She takes metoprolol  50 mg twice a day and  hydrochlorothiazide  25 mg twice a day for blood pressure management, which she reports is well-controlled. She has a new wrist blood pressure monitor but has not yet used it.  She has a history of vascular issues but reports no blood clots on a recent ultrasound. She uses compression stockings but finds them uncomfortable and has tried various types to alleviate discomfort.  She has recently updated her eyeglasses due to significant changes in her vision and plans to follow up with her eye doctor. She has not had a mammogram and declines to have one. She also reports good bone density historically but cannot recall her last bone density test.  HLD-intol statins Migraine-uses T3-and sleeps.  Lately, the T3 doesn't make drowsy so not getting same relief.     Health Maintenance Due  Topic Date Due   DEXA SCAN  Never done   FOOT EXAM  02/21/2023   OPHTHALMOLOGY EXAM  05/07/2023    Past Medical History:  Diagnosis Date   Arthritis    C. difficile colitis    Diabetes mellitus    Diverticulitis    Fibromyalgia    Hypercholesteremia    Hypertension    IBS (irritable bowel syndrome)    Migraines    Nasal fracture    Stein-Leventhal ovaries     Past Surgical History:  Procedure Laterality Date   CHOLECYSTECTOMY  1998   KNEE ARTHROSCOPY Right 2017   KNEE ARTHROSCOPY Left    LEFT HEART CATHETERIZATION WITH CORONARY ANGIOGRAM N/A 12/19/2011   Procedure: LEFT HEART CATHETERIZATION WITH CORONARY ANGIOGRAM;  Surgeon: Dorothye Gathers, MD;  Location: [facility] CATH  LAB;  Service: Cardiovascular;  Laterality: N/A;   ROTATOR CUFF REPAIR Right    TONSILLECTOMY  1959     Current Outpatient Medications:    acetaminophen  (TYLENOL ) 650 MG CR tablet, Take 650 mg by mouth every 8 (eight) hours as needed for pain., Disp: , Rfl:    acetaminophen -codeine  (TYLENOL  #3) 300-30 MG tablet, Take 1 tablet by mouth every 6 (six) hours as needed for moderate pain (pain score 4-6)., Disp: 30 tablet, Rfl: 0   albuterol   (VENTOLIN  HFA) 108 (90 Base) MCG/ACT inhaler, Inhale 2 puffs into the lungs every 6 (six) hours as needed for wheezing or shortness of breath., Disp: 8 g, Rfl: 0   cholecalciferol (VITAMIN D) 1000 UNITS tablet, Take 1,000 Units by mouth every evening. , Disp: , Rfl:    Continuous Glucose Sensor (FREESTYLE LIBRE 3 PLUS SENSOR) MISC, 1 each by Does not apply route every 14 (fourteen) days., Disp: 2 each, Rfl: 11   hydrochlorothiazide  (HYDRODIURIL ) 50 MG tablet, Take 1/2 (one-half) tablet by mouth twice daily, Disp: 90 tablet, Rfl: 1   HYDROcodone -acetaminophen  (NORCO) 7.5-325 MG tablet, Take 1 tablet by mouth every 6 (six) hours as needed for moderate pain (pain score 4-6)., Disp: 20 tablet, Rfl: 0   insulin  NPH Human (NOVOLIN N) 100 UNIT/ML injection, Inject 0.3 mLs (30 Units total) into the skin 2 (two) times daily before a meal. (Patient taking differently: Inject 40 Units into the skin 2 (two) times daily before a meal.), Disp: 20 mL, Rfl: 0   insulin  regular (NOVOLIN R) 100 units/mL injection, Inject 15 Units into the skin 3 (three) times daily before meals., Disp: , Rfl:    metoprolol  tartrate (LOPRESSOR ) 50 MG tablet, Take 0.5 tablets (25 mg total) by mouth 2 (two) times daily., Disp: 90 tablet, Rfl: 3   potassium chloride  (KLOR-CON ) 8 MEQ tablet, Take 8 mEq by mouth 2 (two) times daily., Disp: , Rfl:    PRESCRIPTION MEDICATION, Patient receives a Steroid injection in the neck once a year with Dr. Donna Fus at [facility] pt is unable to recall the exact name and i have been unable to verify name from the Dr's office. Patient also states " it shoots up my blood sugars really high, but i still get it because i can't take anything else for my neck pain", Disp: , Rfl:    promethazine  (PHENERGAN ) 25 MG tablet, Take 1 tablet (25 mg total) by mouth every 8 (eight) hours as needed for nausea or vomiting., Disp: 20 tablet, Rfl: 1   tiZANidine  (ZANAFLEX ) 4 MG tablet, Take 1 tablet (4 mg total) by mouth  every 6 (six) hours as needed for muscle spasms. (Patient taking differently: Take 2-4 mg by mouth every 6 (six) hours as needed for muscle spasms.), Disp: 30 tablet, Rfl: 3   Transparent Dressings (TEGADERM FILM 2-3/8"X2-3/4") MISC, 1 each by Does not apply route once a week. Put over glucometer sensor to keep in place, Disp: 20 each, Rfl: 3   amoxicillin -clavulanate (AUGMENTIN ) 875-125 MG tablet, Take 1 tablet by mouth 2 (two) times daily. (Patient not taking: Reported on 06/11/2023), Disp: 20 tablet, Rfl: 0  Allergies  Allergen Reactions   Ceftriaxone  Hives, Itching and Other (See Comments)   Dicyclomine  Hives    Hives, itching, swelling, vomiting, felt like something was sitting on chest   Erythromycin Anaphylaxis and Other (See Comments)   Iodinated Contrast Media Hives, Other (See Comments) and Shortness Of Breath   Canagliflozin Rash and Other (See Comments)  Dehydration Dehydration Dehydration    Statins Other (See Comments)    Severe muscle pain/weakness Muscle pain/ weekness    Sulfa Antibiotics Other (See Comments)    blackout PER PT SHE BLACKS OUT    Corticosteroids Other (See Comments)   Insulin  Glargine Other (See Comments)    Severe ankle swelling   Metformin And Related Diarrhea and Nausea And Vomiting    Violently ill for 2 yrs   Metformin Hcl Other (See Comments)   Scallops [Shellfish Allergy] Diarrhea and Nausea And Vomiting    Severe GI upset   Tramadol Hcl Other (See Comments)   Cipro  [Ciprofloxacin  Hcl] Rash    10/29-new rash   Nsaids Other (See Comments)    Stomach problems Stomach problems    Respiratory Syncytial Virus Mrna Vaccine Rash    Left Swelling arm, hot, fatigue   Tramadol Rash   ROS neg/noncontributory except as noted HPI/below      Objective:     BP 126/88   Pulse 77   Temp (!) 97 F (36.1 C) (Temporal)   Resp 18   Ht 5' 4.25" (1.632 m)   SpO2 99%   BMI 32.19 kg/m  Wt Readings from Last 3 Encounters:  03/24/23 189 lb (85.7  kg)  01/08/23 184 lb (83.5 kg)  12/03/22 194 lb (88 kg)    Physical Exam   Gen: WDWN NAD HEENT: NCAT, conjunctiva not injected, sclera nonicteric NECK:  supple, no thyromegaly, no nodes, no carotid bruits CARDIAC: RRR, S1S2+, no murmur. DP 2+B LUNGS: CTAB. No wheezes ABDOMEN:  BS+, soft, NTND, No HSM, no masses EXT: chronic edema MSK: no gross abnormalities.  NEURO: A&O x3.  CN II-XII intact.  PSYCH: normal mood. Good eye contact     Assessment & Plan:  Type 2 diabetes mellitus with hyperglycemia, with long-term current use of insulin  (HCC) -     Comprehensive metabolic panel with GFR -     Hemoglobin A1c -     TSH  Essential hypertension -     CBC with Differential/Platelet -     Comprehensive metabolic panel with GFR -     Magnesium  Mixed hyperlipidemia -     TSH -     Lipid panel  Cervical radiculopathy  Drug-induced myopathy  History of diverticulitis  Migraine without aura and without status migrainosus, not intractable  Estrogen deficiency -     DG Bone Density; Future  Assessment and Plan Assessment & Plan Type 2 Diabetes Mellitus with hyperglycemia   Blood glucose levels are fluctuating, with a recent morning reading of 307 mg/dL. She is on NPH insulin  (40 units twice daily) and regular insulin  (15 units twice daily) but omits regular insulin  at lunch, causing postprandial spikes. Nocturnal hypoglycemia occurs with glucose dropping to 69 mg/dL. Inconsistent dietary habits and meal timing contribute to glucose variability, worsened by chronic pain and fatigue. Administer regular insulin  at lunch to cover midday meals. Emphasize consistent meal timing and incorporating protein to stabilize glucose levels. Recommend protein drinks or milk with cereal to prevent nocturnal hypoglycemia. Order comprehensive blood work including HbA1c.  Chronic pain   Chronic pain in the spine and left side, worsened by physical activity, affects daily activities and dietary  choices, leading to comfort eating. She avoids using mobility aids like electric carts despite significant pain during activities like shopping. Encourage use of mobility aids to prevent pain exacerbation. Discuss dietary modifications to manage pain and avoid comfort eating. Continue care per pain mgmt-getting steroid injections  intermitt.   Hypertension   Blood pressure is well-controlled with metoprolol  50 mg twice daily and hydrochlorothiazide  25 mg twice daily. No reports of chest pain or shortness of breath.  Diverticulitis   A recent episode treated with Augmentin  has improved symptoms but not completely resolved them. Occasional dietary triggers still cause discomfort. No follow-up with a gastroenterologist is planned unless symptoms recur. Monitor symptoms and consider follow-up if they persist or recur.  Cough   A persistent cough developed after starting Augmentin , possibly related to sinus issues. No signs of allergic reaction to Augmentin . The cough is bothersome but not associated with shortness of breath. Use inhaler as needed for symptom relief.  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 She has not had a recent bone density scan but reports historically good results. She is due for blood work and an eye exam release is needed to update records. Order a bone density scan at a convenient location. Obtain release for recent eye exam records. Perform comprehensive blood work.    Return in about 3 months (around 09/11/2023) for chronic follow-up.  Ellsworth Haas, MD

## 2023-06-11 NOTE — Patient Instructions (Signed)

## 2023-06-12 ENCOUNTER — Telehealth: Payer: Self-pay

## 2023-06-12 ENCOUNTER — Other Ambulatory Visit (HOSPITAL_COMMUNITY): Payer: Self-pay

## 2023-06-12 ENCOUNTER — Other Ambulatory Visit: Payer: Self-pay | Admitting: *Deleted

## 2023-06-12 MED ORDER — BEMPEDOIC ACID 180 MG PO TABS: 1.0000 | ORAL_TABLET | Freq: Every day | ORAL | 0 refills | Status: DC

## 2023-06-12 NOTE — Telephone Encounter (Signed)
 Pharmacy Patient Advocate Encounter   Received notification from Onbase that prior authorization for Nexletol 180MG  tablets is required/requested.   Insurance verification completed.   The patient is insured through [employer] .   Per test claim: PA required; PA submitted to above mentioned insurance via CoverMyMeds Key/confirmation #/EOC [id number] Status is pending   Received notification from [employer] that Prior Authorization for Nexletol 180MG  tablets has been APPROVED from 05/13/23 to 06/11/24. Ran test claim, Copay is $47.00. This test claim was processed through [facility]- copay amounts may vary at other pharmacies due to pharmacy/plan contracts, or as the patient moves through the different stages of their insurance plan.   PA #/Case ID/Reference #: [id number]

## 2023-06-19 ENCOUNTER — Encounter: Payer: Self-pay | Admitting: Family Medicine

## 2023-06-20 ENCOUNTER — Telehealth: Payer: Self-pay | Admitting: Gastroenterology

## 2023-06-20 NOTE — Telephone Encounter (Signed)
 She and I had a discussion about this at the February office visit. Not all of these episodes are truly diverticulitis, and she has tendencies to irregular bowel habits that at times also contribute to her symptoms. Probably an element of IBS but intolerant of prior dicyclomine  trial, and many meds do not agree with her.  Needs OV with me or APP.  Can have Rx for 20 tablets of ondansetron  4mg  to take instead of phenergan  if she would like to do so (not listed among her allergies), but use sparingly b/c can cause constipation.  - H. Danis

## 2023-06-20 NOTE — Telephone Encounter (Signed)
 Pt reports in the last month or so she has had 2 rounds of what feels like diverticulitis. The first one was "fairly short" and patient was on abts for 10 days. Patient states that she thought she was feeling better, until Thursday (yesterday). She reports that yesterday she woke up with severe cramping radiating across her entire abdomen and into her back. She states the cramping was so severe she had nausea and vomiting. She reports that on Wednesday she ate beef, which have caused similar but milder issues before. She also reports that she started taking a new medication on Wednesday, Nexletol , which is a non-statin cholesterol lowering medication. Reports she did not take this medication yesterday. She states that today she is feeling somewhat better. Has not been eating much, mostly simple carbs, as she did not want to throw up further. Denies diarrhea and constipation. Reports she had a small BM this morning but did not have to strain. Reports that she has Phenergan  at home, however due to working and the Phenergan  making her sleepy she was unable to take this medication.

## 2023-06-20 NOTE — Telephone Encounter (Signed)
 Inbound call from patient stating she has been having diverticulitis flare ups. Patient is concerned about how often she has flare ups. Requesting a call back. Please advise, thank you.

## 2023-06-20 NOTE — Telephone Encounter (Signed)
 Called and spoke with patient. Relayed provider message. Patient states she does not want to try the Ondansetron  at this time. OV scheduled for 06/16. Patient states she is continuing to feel better. Advised patient that if she continues to have repeat symptoms or symptoms increase to be seen for evaluation. She verbalized understanding.

## 2023-06-25 DIAGNOSIS — M25571 Pain in right ankle and joints of right foot: Secondary | ICD-10-CM | POA: Diagnosis not present

## 2023-06-25 DIAGNOSIS — M19071 Primary osteoarthritis, right ankle and foot: Secondary | ICD-10-CM | POA: Diagnosis not present

## 2023-07-02 ENCOUNTER — Ambulatory Visit: Payer: Self-pay | Admitting: Family Medicine

## 2023-07-02 ENCOUNTER — Ambulatory Visit (HOSPITAL_BASED_OUTPATIENT_CLINIC_OR_DEPARTMENT_OTHER)
Admission: RE | Admit: 2023-07-02 | Discharge: 2023-07-02 | Disposition: A | Discharge status: Home / Self Care | Payer: Medicare (Managed Care) | Source: Ambulatory Visit | Attending: Family Medicine | Admitting: Family Medicine

## 2023-07-02 DIAGNOSIS — E2839 Other primary ovarian failure: Secondary | ICD-10-CM | POA: Insufficient documentation

## 2023-07-02 NOTE — Progress Notes (Signed)
 Normal bone density

## 2023-07-15 ENCOUNTER — Other Ambulatory Visit: Payer: Self-pay | Admitting: Family Medicine

## 2023-07-21 ENCOUNTER — Encounter: Payer: Self-pay | Admitting: Gastroenterology

## 2023-07-21 ENCOUNTER — Ambulatory Visit: Payer: Medicare (Managed Care) | Admitting: Gastroenterology

## 2023-07-21 VITALS — BP 150/80 | HR 91 | Ht 65.0 in | Wt 193.0 lb

## 2023-07-21 DIAGNOSIS — R1032 Left lower quadrant pain: Secondary | ICD-10-CM | POA: Diagnosis not present

## 2023-07-21 DIAGNOSIS — K5909 Other constipation: Secondary | ICD-10-CM
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
## 2023-07-21 NOTE — Patient Instructions (Signed)
 We have given you samples of the following medication to take: Ibgard  _______________________________________________________  If your blood pressure at your visit was 140/90 or greater, please contact your primary care physician to follow up on this.  _______________________________________________________  If you are age 74 or older, your body mass index should be between 23-30. Your Body mass index is 32.12 kg/m. If this is out of the aforementioned range listed, please consider follow up with your Primary Care Provider.  If you are age 33 or younger, your body mass index should be between 19-25. Your Body mass index is 32.12 kg/m. If this is out of the aformentioned range listed, please consider follow up with your Primary Care Provider.   ________________________________________________________  The [facility] providers would like to encourage you to use MYCHART to communicate with providers for non-urgent requests or questions.  Due to long hold times on the telephone, sending your provider a message by [facility] may be a faster and more efficient way to get a response.  Please allow 48 business hours for a response.  Please remember that this is for non-urgent requests.  _______________________________________________________  Thank you for trusting me with your gastrointestinal care!    Dr. Kaleen [facility]

## 2023-07-31 DIAGNOSIS — M19071 Primary osteoarthritis, right ankle and foot: Secondary | ICD-10-CM | POA: Diagnosis not present

## 2023-08-18 ENCOUNTER — Other Ambulatory Visit: Payer: Self-pay | Admitting: Family Medicine

## 2023-08-18 DIAGNOSIS — Z794 Long term (current) use of insulin: Secondary | ICD-10-CM

## 2023-08-18 DIAGNOSIS — E1165 Type 2 diabetes mellitus with hyperglycemia: Secondary | ICD-10-CM

## 2023-08-18 MED ORDER — FREESTYLE LIBRE 3 PLUS SENSOR MISC: 1.0000 | 11 refills | Status: DC

## 2023-08-18 NOTE — Telephone Encounter (Signed)
 Copied from CRM [id number]. Topic: Clinical - Medication Refill >> Aug 18, 2023  9:24 AM Deaijah H wrote: Medication: Continuous Glucose Sensor (FREESTYLE LIBRE 3 PLUS SENSOR) MISC  Has the patient contacted their pharmacy? Yes (Agent: If no, request that the patient contact the pharmacy for the refill. If patient does not wish to contact the pharmacy document the reason why and proceed with request.) (Agent: If yes, when and what did the pharmacy advise?)  This is the patient's preferred pharmacy:  [facility] [address], [place] - [address] [place]  [address] Phone: [phone] Fax: [phone]   Is this the correct pharmacy for this prescription? Yes If no, delete pharmacy and type the correct one.   Has the prescription been filled recently? Yes  Is the patient out of the medication? Yes  Has the patient been seen for an appointment in the last year OR does the patient have an upcoming appointment? Yes  Can we respond through MyChart? Yes  Agent: Please be advised that Rx refills may take up to 3 business days. We ask that you follow-up with your pharmacy.

## 2023-08-19 ENCOUNTER — Other Ambulatory Visit: Payer: Self-pay | Admitting: Family Medicine

## 2023-08-19 DIAGNOSIS — Z794 Long term (current) use of insulin: Secondary | ICD-10-CM

## 2023-08-19 DIAGNOSIS — E1165 Type 2 diabetes mellitus with hyperglycemia: Secondary | ICD-10-CM

## 2023-08-19 MED ORDER — FREESTYLE LIBRE 3 PLUS SENSOR MISC: 11 refills | Status: AC

## 2023-08-19 NOTE — Progress Notes (Signed)
 Pharm needed q 14 days changed to 15 days

## 2023-09-01 DIAGNOSIS — M47816 Spondylosis without myelopathy or radiculopathy, lumbar region: Secondary | ICD-10-CM | POA: Diagnosis not present

## 2023-09-17 ENCOUNTER — Encounter: Payer: Self-pay | Admitting: Family Medicine

## 2023-09-17 ENCOUNTER — Ambulatory Visit (INDEPENDENT_AMBULATORY_CARE_PROVIDER_SITE_OTHER): Payer: Medicare (Managed Care) | Admitting: Family Medicine

## 2023-09-17 VITALS — BP 129/84 | HR 91 | Temp 97.5°F | Resp 18 | Ht 65.0 in

## 2023-09-17 DIAGNOSIS — G43009 Migraine without aura, not intractable, without status migrainosus: Secondary | ICD-10-CM | POA: Diagnosis not present

## 2023-09-17 DIAGNOSIS — M25571 Pain in right ankle and joints of right foot: Secondary | ICD-10-CM | POA: Diagnosis not present

## 2023-09-17 DIAGNOSIS — E1165 Type 2 diabetes mellitus with hyperglycemia: Secondary | ICD-10-CM

## 2023-09-17 DIAGNOSIS — I10 Essential (primary) hypertension: Secondary | ICD-10-CM

## 2023-09-17 DIAGNOSIS — E7800 Pure hypercholesterolemia, unspecified: Secondary | ICD-10-CM | POA: Diagnosis not present

## 2023-09-17 DIAGNOSIS — Z794 Long term (current) use of insulin: Secondary | ICD-10-CM | POA: Diagnosis not present

## 2023-09-17 DIAGNOSIS — M79671 Pain in right foot: Secondary | ICD-10-CM | POA: Diagnosis not present

## 2023-09-17 MED ORDER — HYDROCHLOROTHIAZIDE 50 MG PO TABS: ORAL_TABLET | ORAL | 1 refills | Status: AC

## 2023-09-17 MED ORDER — ALBUTEROL SULFATE HFA 108 (90 BASE) MCG/ACT IN AERS: 2.0000 | INHALATION_SPRAY | Freq: Four times a day (QID) | RESPIRATORY_TRACT | 0 refills | Status: AC | PRN

## 2023-09-17 NOTE — Progress Notes (Signed)
 Subjective:     Patient ID: [name], female    DOB: 09/07/1950, 73 y.o.   MRN: [id number]  Chief Complaint  Patient presents with   Medical Management of Chronic Issues    Follow-up Need medication refills Fasting     HPI Discussed the use of AI scribe software for clinical note transcription with the patient, who gave verbal consent to proceed.  History of Present Illness [name] is a 73 year old female with diabetes, hypertension, and hyperlipidemia who presents for follow-up on her chronic conditions.  She has ongoing issues with her right foot, including torn tendons and arthritis. Surgery is not an option due to the condition of the tendons. She uses an orthopedic shoe brace, which affects her back due to the size difference in shoes.seeing [facility]  Her diabetes management includes Novolin N and R, with doses of 40 units of long-acting insulin  in the morning and evening, and 15 units of short-acting insulin  in the morning and evening. She experiences fluctuations in her blood sugar levels, with morning readings around 128 mg/dL, postprandial spikes up to 300 mg/dL, and nighttime lows around 93 mg/dL. She has had issues with her [facility] monitor, including sensor errors and failures. Her diet includes whole wheat toast, eggs, Special K with fruit, and pretzels, and she is considering increasing protein intake.  She has a history of diverticulitis and uses Ibgard as needed for gastrointestinal symptoms, which she finds helpful. She experiences muscle pain and uses Tylenol  with codeine  for relief, and hydrocodone  for migraines, which occur once or twice a month. She takes tizanidine  for muscle spasms, which makes her sleepy.  Her blood pressure is managed with hydrochlorothiazide  50 mg daily and metoprolol  25 mg twice daily. She uses albuterol  more frequently due to congestion and weakness in the inhaler.  She has a history of blood clots and varicose  veins, leading to swelling in her legs. She uses compression stockings to manage the swelling.  She has recently acquired a rescue kitten, adding to her household of three other cats. She works from home.    Health Maintenance Due  Topic Date Due   Medicare Annual Wellness (AWV)  Never done   OPHTHALMOLOGY EXAM  05/07/2023   INFLUENZA VACCINE  09/05/2023    Past Medical History:  Diagnosis Date   Arthritis    C. difficile colitis    Diabetes mellitus    Diverticulitis    Fibromyalgia    Hypercholesteremia    Hypertension    IBS (irritable bowel syndrome)    Migraines    Nasal fracture    Stein-Leventhal ovaries    Torn tendon    right ankle   Ulcer in my 20s; & lining of stomach badly inflamed twice due to meds - took months to heal    Past Surgical History:  Procedure Laterality Date   CHOLECYSTECTOMY  1998   KNEE ARTHROSCOPY Right 2017   KNEE ARTHROSCOPY Left    LEFT HEART CATHETERIZATION WITH CORONARY ANGIOGRAM N/A 12/19/2011   Procedure: LEFT HEART CATHETERIZATION WITH CORONARY ANGIOGRAM;  Surgeon: Oneil Parchment, MD;  Location: [facility] CATH LAB;  Service: Cardiovascular;  Laterality: N/A;   ROTATOR CUFF REPAIR Right    TONSILLECTOMY  1959     Current Outpatient Medications:    acetaminophen  (TYLENOL ) 650 MG CR tablet, Take 650 mg by mouth every 8 (eight) hours as needed for pain., Disp: , Rfl:    acetaminophen -codeine  (TYLENOL  #3) 300-30 MG tablet, Take  1 tablet by mouth every 6 (six) hours as needed for moderate pain (pain score 4-6)., Disp: 30 tablet, Rfl: 0   cholecalciferol (VITAMIN D) 1000 UNITS tablet, Take 1,000 Units by mouth every evening. , Disp: , Rfl:    Continuous Glucose Sensor (FREESTYLE LIBRE 3 PLUS SENSOR) MISC, 1 each every 15 days applied to upper arm, Disp: 2 each, Rfl: 11   HYDROcodone -acetaminophen  (NORCO) 7.5-325 MG tablet, Take 1 tablet by mouth every 6 (six) hours as needed for moderate pain (pain score 4-6)., Disp: 20 tablet, Rfl: 0   insulin  NPH  Human (NOVOLIN N) 100 UNIT/ML injection, Inject 0.3 mLs (30 Units total) into the skin 2 (two) times daily before a meal. (Patient taking differently: Inject 40 Units into the skin 2 (two) times daily before a meal.), Disp: 20 mL, Rfl: 0   insulin  regular (NOVOLIN R) 100 units/mL injection, Inject 15 Units into the skin 3 (three) times daily before meals., Disp: , Rfl:    metoprolol  tartrate (LOPRESSOR ) 50 MG tablet, Take 0.5 tablets (25 mg total) by mouth 2 (two) times daily., Disp: 90 tablet, Rfl: 3   potassium chloride  (KLOR-CON ) 8 MEQ tablet, Take 8 mEq by mouth 2 (two) times daily., Disp: , Rfl:    PRESCRIPTION MEDICATION, Patient receives a Steroid injection in the neck once a year with Dr. Cesario at [facility] pt is unable to recall the exact name and i have been unable to verify name from the Dr's office. Patient also states  it shoots up my blood sugars really high, but i still get it because i can't take anything else for my neck pain, Disp: , Rfl:    promethazine  (PHENERGAN ) 25 MG tablet, Take 1 tablet (25 mg total) by mouth every 8 (eight) hours as needed for nausea or vomiting., Disp: 20 tablet, Rfl: 1   tiZANidine  (ZANAFLEX ) 4 MG tablet, Take 0.5-1 tablets (2-4 mg total) by mouth every 6 (six) hours as needed for muscle spasms., Disp: 30 tablet, Rfl: 3   albuterol  (VENTOLIN  HFA) 108 (90 Base) MCG/ACT inhaler, Inhale 2 puffs into the lungs every 6 (six) hours as needed for wheezing or shortness of breath., Disp: 8 g, Rfl: 0   hydrochlorothiazide  (HYDRODIURIL ) 50 MG tablet, Take 1/2 (one-half) tablet by mouth twice daily, Disp: 90 tablet, Rfl: 1  Allergies  Allergen Reactions   Ceftriaxone  Hives, Itching and Other (See Comments)   Dicyclomine  Hives    Hives, itching, swelling, vomiting, felt like something was sitting on chest   Erythromycin Anaphylaxis and Other (See Comments)   Iodinated Contrast Media Hives, Other (See Comments) and Shortness Of Breath   Canagliflozin Rash  and Other (See Comments)    Dehydration Dehydration Dehydration    Statins Other (See Comments)    Severe muscle pain/weakness Muscle pain/ weekness    Sulfa Antibiotics Other (See Comments)    blackout PER PT SHE BLACKS OUT    Bempedoic Acid  Nausea And Vomiting   Corticosteroids Other (See Comments)   Insulin  Glargine Other (See Comments)    Severe ankle swelling   Metformin And Related Diarrhea and Nausea And Vomiting    Violently ill for 2 yrs   Metformin Hcl Other (See Comments)   Scallops [Shellfish Allergy] Diarrhea and Nausea And Vomiting    Severe GI upset   Tramadol Hcl Other (See Comments)   Cipro  [Ciprofloxacin  Hcl] Rash    10/29-new rash   Nsaids Other (See Comments)    Stomach problems Stomach problems  Respiratory Syncytial Virus Mrna Vaccine Rash    Left Swelling arm, hot, fatigue   Tramadol Rash   ROS neg/noncontributory except as noted HPI/below      Objective:     BP 129/84   Pulse 91   Temp (!) 97.5 F (36.4 C) (Temporal)   Resp 18   Ht 5' 5 (1.651 m)   SpO2 96%   BMI 32.12 kg/m  Wt Readings from Last 3 Encounters:  07/21/23 193 lb (87.5 kg)  03/24/23 189 lb (85.7 kg)  01/08/23 184 lb (83.5 kg)    Physical Exam   Gen: WDWN NAD HEENT: NCAT, conjunctiva not injected, sclera nonicteric NECK:  supple, no thyromegaly, no nodes, no carotid bruits CARDIAC: RRR, S1S2+, no murmur. DP 2+B LUNGS: CTAB. No wheezes ABDOMEN:  BS+, soft, NTND, No HSM, no masses EXT:  1+  edema.  Varicose veins R MSK: no gross abnormalities x cane and brace R ankle.  NEURO: A&O x3.  CN II-XII intact.  PSYCH: normal mood. Good eye contact     Assessment & Plan:  Pure hypercholesterolemia  Type 2 diabetes mellitus with hyperglycemia, with long-term current use of insulin  (HCC)  Essential hypertension -     hydroCHLOROthiazide ; Take 1/2 (one-half) tablet by mouth twice daily  Dispense: 90 tablet; Refill: 1  Migraine without aura and without status  migrainosus, not intractable  Long-term insulin  use (HCC)  Other orders -     Albuterol  Sulfate HFA; Inhale 2 puffs into the lungs every 6 (six) hours as needed for wheezing or shortness of breath.  Dispense: 8 g; Refill: 0  Assessment and Plan Assessment & Plan Type 2 diabetes mellitus   Blood glucose levels fluctuate significantly, with postprandial levels reaching 300 mg/dL and dropping to 836 mg/dL. Issues with the [facility] sensor have led to inconsistent readings. Collaborate with pharmacist Tammy to troubleshoot sensor issues. Monitor blood glucose levels more consistently, especially before supper. Consider increasing morning Novolin N dosage if postprandial levels remain high. Increase morning protein intake to stabilize blood glucose levels. Will get copy ophth exam.  Declined labs/urine  Hypertension   Blood pressure is managed with hydrochlorothiazide  and metoprolol , each 25 mg twice daily. Send prescription for hydrochlorothiazide  to pharmacy.  Hyperlipidemia   No significant changes in management were made as intolerant to meds. .  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 Surgery is not an option due to tendon damage. Continue using orthopedic shoe brace for support.  Chronic low back pain   Exacerbated by uneven footwear due to the orthopedic brace. Managed with pain management injections. Continue with scheduled injections.  Migraine (muscle-related)   Migraines occur once or twice a month, likely muscle-related due to spine issues. Managed with hydrocodone  and tizanidine  as needed.  Chronic venous insufficiency with lower extremity edema and varicose veins   Intermittent swelling in the lower extremities due to vein damage from previous blood clots. Elevate legs as much as possible and wear compression stockings to manage swelling.  Asthma   Managed with albuterol , but  the inhaler is not functioning optimally. Experiences congestion and uses DayQuil for relief. Rinse albuterol  inhaler to improve function. Send new albuterol  inhaler prescription to pharmacy. Use Flonase  or similar nasal spray for congestion.  Diverticulitis (history of, currently stable)   Managed with Ibogard as needed for gastrointestinal symptoms. Continue using Ibogard as needed.    Return in about 3 months (  around 12/18/2023) for awv-tina-soon.    me in 3 mo.  Jenkins CHRISTELLA Carrel, MD

## 2023-09-17 NOTE — Patient Instructions (Signed)

## 2023-11-03 DIAGNOSIS — M47816 Spondylosis without myelopathy or radiculopathy, lumbar region: Secondary | ICD-10-CM | POA: Diagnosis not present

## 2023-12-18 ENCOUNTER — Ambulatory Visit: Payer: Medicare (Managed Care)

## 2024-01-19 ENCOUNTER — Encounter: Payer: Self-pay | Admitting: Family Medicine

## 2024-01-19 ENCOUNTER — Other Ambulatory Visit (HOSPITAL_BASED_OUTPATIENT_CLINIC_OR_DEPARTMENT_OTHER): Payer: Self-pay

## 2024-01-19 ENCOUNTER — Other Ambulatory Visit: Payer: Self-pay

## 2024-01-19 ENCOUNTER — Ambulatory Visit: Payer: Medicare (Managed Care) | Admitting: Family Medicine

## 2024-01-19 VITALS — BP 134/76 | HR 80 | Temp 97.2°F | Ht 65.0 in

## 2024-01-19 DIAGNOSIS — E7800 Pure hypercholesterolemia, unspecified: Secondary | ICD-10-CM | POA: Diagnosis not present

## 2024-01-19 DIAGNOSIS — I10 Essential (primary) hypertension: Secondary | ICD-10-CM

## 2024-01-19 DIAGNOSIS — T466X5A Adverse effect of antihyperlipidemic and antiarteriosclerotic drugs, initial encounter: Secondary | ICD-10-CM | POA: Diagnosis not present

## 2024-01-19 DIAGNOSIS — M791 Myalgia, unspecified site: Secondary | ICD-10-CM | POA: Diagnosis not present

## 2024-01-19 DIAGNOSIS — Z794 Long term (current) use of insulin: Secondary | ICD-10-CM

## 2024-01-19 DIAGNOSIS — E119 Type 2 diabetes mellitus without complications: Secondary | ICD-10-CM | POA: Diagnosis not present

## 2024-01-19 DIAGNOSIS — J0101 Acute recurrent maxillary sinusitis: Secondary | ICD-10-CM | POA: Diagnosis not present

## 2024-01-19 DIAGNOSIS — E1165 Type 2 diabetes mellitus with hyperglycemia: Secondary | ICD-10-CM

## 2024-01-19 LAB — COMPREHENSIVE METABOLIC PANEL WITH GFR
ALT: 11 U/L (ref 0–35)
AST: 14 U/L (ref 0–37)
Albumin: 4.1 g/dL (ref 3.5–5.2)
Alkaline Phosphatase: 91 U/L (ref 39–117)
BUN: 22 mg/dL (ref 6–23)
CO2: 29 meq/L (ref 19–32)
Calcium: 9.4 mg/dL (ref 8.4–10.5)
Chloride: 99 meq/L (ref 96–112)
Creatinine, Ser: 0.87 mg/dL (ref 0.40–1.20)
GFR: 65.43 mL/min (ref >60.00)
Glucose, Bld: 170 mg/dL — ABNORMAL HIGH (ref 70–99)
Potassium: 3.8 meq/L (ref 3.5–5.1)
Sodium: 139 meq/L (ref 135–145)
Total Bilirubin: 0.6 mg/dL (ref 0.2–1.2)
Total Protein: 7.4 g/dL (ref 6.0–8.3)

## 2024-01-19 LAB — MICROALBUMIN / CREATININE URINE RATIO
Creatinine,U: 126.3 mg/dL
Microalb Creat Ratio: 40.9 mg/g — ABNORMAL HIGH (ref 0.0–30.0)
Microalb, Ur: 5.2 mg/dL — ABNORMAL HIGH (ref 0.0–1.9)

## 2024-01-19 LAB — CBC WITH DIFFERENTIAL/PLATELET
Basophils Absolute: 0 K/uL (ref 0.0–0.1)
Basophils Relative: 0.6 % (ref 0.0–3.0)
Eosinophils Absolute: 0.2 K/uL (ref 0.0–0.7)
Eosinophils Relative: 2.1 % (ref 0.0–5.0)
HCT: 40.3 % (ref 36.0–46.0)
Hemoglobin: 13.7 g/dL (ref 12.0–15.0)
Lymphocytes Relative: 27.6 % (ref 12.0–46.0)
Lymphs Abs: 2.2 K/uL (ref 0.7–4.0)
MCHC: 34 g/dL (ref 30.0–36.0)
MCV: 90.2 fl (ref 78.0–100.0)
Monocytes Absolute: 0.8 K/uL (ref 0.1–1.0)
Monocytes Relative: 10 % (ref 3.0–12.0)
Neutro Abs: 4.8 K/uL (ref 1.4–7.7)
Neutrophils Relative %: 59.7 % (ref 43.0–77.0)
Platelets: 269 K/uL (ref 150.0–400.0)
RBC: 4.47 Mil/uL (ref 3.87–5.11)
RDW: 13.3 % (ref 11.5–15.5)
WBC: 8 K/uL (ref 4.0–10.5)

## 2024-01-19 LAB — CK: Total CK: 55 U/L (ref 17–177)

## 2024-01-19 LAB — HEMOGLOBIN A1C: Hgb A1c MFr Bld: 10.2 % — ABNORMAL HIGH (ref 4.6–6.5)

## 2024-01-19 MED ORDER — DOXYCYCLINE HYCLATE 100 MG PO TABS
100.0000 mg | ORAL_TABLET | Freq: Two times a day (BID) | ORAL | 0 refills | Status: DC
  Filled 2024-01-19: qty 20, 10d supply, fill #0

## 2024-01-19 MED ORDER — IMIQUIMOD 5 % EX CREA
TOPICAL_CREAM | CUTANEOUS | 0 refills | Status: AC
  Filled 2024-01-19: qty 12, 12d supply, fill #0
  Filled 2024-01-19: qty 12, 28d supply, fill #0

## 2024-01-19 NOTE — Patient Instructions (Addendum)
 It was very nice to see you today!  Happy Holidays  Nasal saline multiple times/day   PLEASE NOTE:  If you had any lab tests please let us  know if you have not heard back within a few days. You may see your results on MyChart before we have a chance to review them but we will give you a call once they are reviewed by us . If we ordered any referrals today, please let us  know if you have not heard from their office within the next week.   Please try these tips to maintain a healthy lifestyle:  Eat most of your calories during the day when you are active. Eliminate processed foods including packaged sweets (pies, cakes, cookies), reduce intake of potatoes, white bread, white pasta, and white rice. Look for whole grain options, oat flour or almond flour.  Each meal should contain half fruits/vegetables, one quarter protein, and one quarter carbs (no bigger than a computer mouse).  Cut down on sweet beverages. This includes juice, soda, and sweet tea. Also watch fruit intake, though this is a healthier sweet option, it still contains natural sugar! Limit to 3 servings daily.  Drink at least 1 glass of water with each meal and aim for at least 8 glasses per day  Exercise at least 150 minutes every week.

## 2024-01-19 NOTE — Progress Notes (Signed)
 Subjective:     Patient ID: [name], female    DOB: March 09, 1950, 73 y.o.   MRN: [id number]  Chief Complaint  Patient presents with   Hyperlipidemia   Diabetes    Pt is here for chronic issues    Discussed the use of AI scribe software for clinical note transcription with the patient, who gave verbal consent to proceed.  History of Present Illness [name] is a 73 year old female with diabetes, hypertension, and hyperlipidemia who presents for follow-up.  She is experiencing muscle issues, particularly in her upper body, and mentions her neck is collapsing, which may require surgery. She receives injections for her lower spine but notes they do not help with her upper body issues. She experiences shortness of breath and fatigue when loading and unloading items, which she attributes to her muscle issues.  Her diabetes management includes long-acting insulin  N at 40 units twice a day and R at 15 units twice a day. She purchases her insulin  over the counter at [employer]. She reports episodes of hypoglycemia, with a recent incident where her blood sugar dropped to 68 mg/dL, causing weakness and necessitating the consumption of cookies to stabilize her levels. She has not seen her endocrinologist in years. Has a lot of dietary indiscretions  She has tried various diets, including a Himalayan salt diet and a patch with herbal ingredients, both of which adversely affected her blood pressure and blood sugar. She is currently using Almased, a protein meal replacement, twice a day, but struggles with maintaining weight loss due to stress eating. Recent family stressors, including the death of her aunt and a cousin, and her niece's health struggles with a double lung transplant rejection, contribute to her eating habits and overall health.  She experiences chronic sinus drainage leading to coughing and choking, especially at night. She has tried DayQuil, Nyquil, Benadryl , and an inhaler for  relief. She has a history of a nasal injury from 2000, which may contribute to her symptoms. She is concerned about her chronic sinus drainage and the possibility of it worsening.  She has white spots on her hands that are growing and causing skin tension, which she is concerned about due to a friend's experience with a similar issue that led to cancer.  HTN-taking hydrochlorothiazide  and metoprolol  25mg  bid.  No problems HLD-intol statins,zetia ,nexlitol     Health Maintenance Due  Topic Date Due   Medicare Annual Wellness (AWV)  Never done   FOOT EXAM  02/21/2023   OPHTHALMOLOGY EXAM  05/07/2023    Past Medical History:  Diagnosis Date   Arthritis    C. difficile colitis    Diabetes mellitus    Diverticulitis    Fibromyalgia    Hypercholesteremia    Hypertension    IBS (irritable bowel syndrome)    Migraines    Nasal fracture    Stein-Leventhal ovaries    Torn tendon    right ankle   Ulcer in my 20s; & lining of stomach badly inflamed twice due to meds - took months to heal    Past Surgical History:  Procedure Laterality Date   CHOLECYSTECTOMY  1998   KNEE ARTHROSCOPY Right 2017   KNEE ARTHROSCOPY Left    LEFT HEART CATHETERIZATION WITH CORONARY ANGIOGRAM N/A 12/19/2011   Procedure: LEFT HEART CATHETERIZATION WITH CORONARY ANGIOGRAM;  Surgeon: Oneil Parchment, MD;  Location[facility] C[facility] LAB;  Service: Cardiovascular;  Laterality: N/A;   ROTATOR CUFF REPAIR Right    TONSILLECTOMY  1959    Current Medications[1]  Allergies[2] ROS neg/noncontributory except as noted HPI/below      Objective:     BP 134/76 (BP Location: Left Arm, Patient Position: Sitting, Cuff Size: Normal)   Pulse 80   Temp (!) 97.2 F (36.2 C) (Temporal)   Ht 5' 5 (1.651 m)   SpO2 97%   BMI 32.12 kg/m  Wt Readings from Last 3 Encounters:  07/21/23 193 lb (87.5 kg)  03/24/23 189 lb (85.7 kg)  01/08/23 184 lb (83.5 kg)    Physical Exam GENERAL: Well developed, well nourished, no acute  distress. HEAD EYES EARS NOSE THROAT: Normocephalic, atraumatic, conjunctiva not injected, sclera nonicteric. CARDIAC: Regular rate and rhythm, S1 S2 present, no murmur, dorsalis pedis 2 plus bilaterally. NECK: Supple, no thyromegaly, no nodes, no carotid bruits. LUNGS: Clear to auscultation bilaterally, no wheezes. ABDOMEN: Bowel sounds present, soft, non-tender, non-distended, no hepatosplenomegaly, no masses. EXTREMITIES: No edema. MUSCULOSKELETAL: No tenderness, cane and brace on R foot NEUROLOGICAL: Alert and oriented x3, cranial nerves II through XII intact. PSYCHIATRIC: Normal mood, good eye contact. SKIN: White spots on dorsum of left index and middle finger near PIP joints, resembling seborrheic keratoses or warts.       Assessment & Plan:  Type 2 diabetes mellitus with hyperglycemia, with long-term current use of insulin  (HCC) -     Comprehensive metabolic panel with GFR -     Hemoglobin A1c -     Microalbumin / creatinine urine ratio  Essential hypertension -     CBC with Differential/Platelet -     Comprehensive metabolic panel with GFR  Pure hypercholesterolemia  Acute recurrent maxillary sinusitis  Myalgia due to statin -     CK  Long-term insulin  use (HCC)  Other orders -     Doxycycline  Hyclate; Take 1 tablet (100 mg total) by mouth 2 (two) times daily.  Dispense: 20 tablet; Refill: 0 -     Imiquimod ; Apply topically 3 (three) times a week.  Dispense: 12 each; Refill: 0    Assessment and Plan Assessment & Plan Type 2 diabetes mellitus   Blood glucose levels are fluctuating with recent hypoglycemic episodes, exacerbated by stress eating. Her current insulin  regimen includes NPH 40 units twice daily and regular insulin  15 units twice daily, . Continue the current insulin  regimen and monitor blood glucose levels regularly. Address stress eating to improve glycemic control. Won't take insulin  at lunch as inconsistent times and having insulin  at work  Essential  hypertension   Blood pressure is managed with metoprolol  and HCTZ. She is salt-sensitive, and recent dietary changes have increased blood pressure. Potassium and magnesium supplements are taken regularly. Continue metoprolol , HCTZ, and supplements. Avoid high-salt diets.  Pure hypercholesterolemia   A previous trial of Zetia  was not well tolerated. Nexletol  was not tolerated, and there is no current statin therapy d/t intolerance.   Acute recurrent maxillary sinusitis   She experiences chronic sinus drainage with nocturnal choking and coughing. Previous treatments with DayQuil, NyQuil, Benadryl , and an inhaler have been ineffective. There may be an anatomical issue due to past nasal trauma. Allergies include sulfa, ceftriaxone , erythromycin, and Cipro , with an uncertain allergy to doxycycline . Use nasal saline spray regularly to manage symptoms. Consider referral to ENT if symptoms persist.  Seborrheic keratosis of left hand vs wart White spots on the dorsum of the left index and middle finger near the PIP joints are likely seborrheic keratosis vs warts. Apply aldara  three nights a week for up to  16 weeks.     Return in about 4 months (around 05/19/2024) for chronic follow-up.  Jenkins CHRISTELLA Carrel, MD     [1]  Current Outpatient Medications:    acetaminophen  (TYLENOL ) 650 MG CR tablet, Take 650 mg by mouth every 8 (eight) hours as needed for pain., Disp: , Rfl:    acetaminophen -codeine  (TYLENOL  #3) 300-30 MG tablet, Take 1 tablet by mouth every 6 (six) hours as needed for moderate pain (pain score 4-6)., Disp: 30 tablet, Rfl: 0   albuterol  (VENTOLIN  HFA) 108 (90 Base) MCG/ACT inhaler, Inhale 2 puffs into the lungs every 6 (six) hours as needed for wheezing or shortness of breath., Disp: 8 g, Rfl: 0   cholecalciferol (VITAMIN D) 1000 UNITS tablet, Take 1,000 Units by mouth every evening. , Disp: , Rfl:    Continuous Glucose Sensor (FREESTYLE LIBRE 3 PLUS SENSOR) MISC, 1 each every 15 days applied to  upper arm, Disp: 2 each, Rfl: 11   doxycycline  (VIBRA -TABS) 100 MG tablet, Take 1 tablet (100 mg total) by mouth 2 (two) times daily., Disp: 20 tablet, Rfl: 0   hydrochlorothiazide  (HYDRODIURIL ) 50 MG tablet, Take 1/2 (one-half) tablet by mouth twice daily, Disp: 90 tablet, Rfl: 1   HYDROcodone -acetaminophen  (NORCO) 7.5-325 MG tablet, Take 1 tablet by mouth every 6 (six) hours as needed for moderate pain (pain score 4-6)., Disp: 20 tablet, Rfl: 0   imiquimod  (ALDARA ) 5 % cream, Apply topically 3 (three) times a week., Disp: 12 each, Rfl: 0   insulin  NPH Human (NOVOLIN N) 100 UNIT/ML injection, Inject 0.3 mLs (30 Units total) into the skin 2 (two) times daily before a meal. (Patient taking differently: Inject 40 Units into the skin 2 (two) times daily before a meal. Pt is taking 40 units bid), Disp: 20 mL, Rfl: 0   insulin  regular (NOVOLIN R) 100 units/mL injection, Inject 15 Units into the skin 3 (three) times daily before meals., Disp: , Rfl:    metoprolol  tartrate (LOPRESSOR ) 50 MG tablet, Take 0.5 tablets (25 mg total) by mouth 2 (two) times daily., Disp: 90 tablet, Rfl: 3   potassium chloride  (KLOR-CON ) 8 MEQ tablet, Take 8 mEq by mouth 2 (two) times daily., Disp: , Rfl:    PRESCRIPTION MEDICATION, Patient receives a Steroid injection in the neck once a year with Dr. Cesario at [facility] pt is unable to recall the exact name and i have been unable to verify name from the Dr's office. Patient also states  it shoots up my blood sugars really high, but i still get it because i can't take anything else for my neck pain, Disp: , Rfl:    promethazine  (PHENERGAN ) 25 MG tablet, Take 1 tablet (25 mg total) by mouth every 8 (eight) hours as needed for nausea or vomiting., Disp: 20 tablet, Rfl: 1   tiZANidine  (ZANAFLEX ) 4 MG tablet, Take 0.5-1 tablets (2-4 mg total) by mouth every 6 (six) hours as needed for muscle spasms., Disp: 30 tablet, Rfl: 3 [2]  Allergies Allergen Reactions   Ceftriaxone   Hives, Itching and Other (See Comments)   Dicyclomine  Hives    Hives, itching, swelling, vomiting, felt like something was sitting on chest   Erythromycin Anaphylaxis and Other (See Comments)   Iodinated Contrast Media Hives, Other (See Comments) and Shortness Of Breath   Canagliflozin Rash and Other (See Comments)    Dehydration Dehydration Dehydration    Statins Other (See Comments)    Severe muscle pain/weakness Muscle pain/ weekness    Sulfa  Antibiotics Other (See Comments)    blackout PER PT SHE BLACKS OUT    Bempedoic Acid  Nausea And Vomiting   Corticosteroids Other (See Comments)   Insulin  Glargine Other (See Comments)    Severe ankle swelling   Metformin And Related Diarrhea and Nausea And Vomiting    Violently ill for 2 yrs   Metformin Hcl Other (See Comments)   Scallops [Shellfish Allergy] Diarrhea and Nausea And Vomiting    Severe GI upset   Tramadol Hcl Other (See Comments)   Cipro  [Ciprofloxacin  Hcl] Rash    10/29-new rash   Nsaids Other (See Comments)    Stomach problems Stomach problems    Respiratory Syncytial Virus Mrna Vaccine Rash    Left Swelling arm, hot, fatigue   Tramadol Rash

## 2024-01-22 ENCOUNTER — Ambulatory Visit: Payer: Self-pay | Admitting: Family Medicine

## 2024-01-23 NOTE — Progress Notes (Signed)
 Called left message for return call smk

## 2024-01-23 NOTE — Progress Notes (Signed)
 Sugars too high-she knows what to do Some early protein in urine-she should be on meds but in past, jardiance too expensive.  Has she ever been on Losartan?  Or is she even willing to take meds

## 2024-02-04 ENCOUNTER — Telehealth: Payer: Self-pay

## 2024-02-04 NOTE — Telephone Encounter (Signed)
 Spoke to patient. Expressed understanding of lab results. Wants to research Losartan before taking. Has follow-up appointment 05/19/2024  Copied from CRM #[id number]. Topic: Clinical - Lab/Test Results >> Feb 04, 2024  8:27 AM [name] wrote: Reason for CRM:  Patient called in regarding lab results from 12/19.. Would like a callback regarding this

## 2024-02-18 ENCOUNTER — Ambulatory Visit (INDEPENDENT_AMBULATORY_CARE_PROVIDER_SITE_OTHER): Payer: Medicare (Managed Care)

## 2024-02-18 VITALS — BP 130/74 | HR 90 | Temp 97.2°F | Ht 65.0 in | Wt 194.0 lb

## 2024-02-18 DIAGNOSIS — Z0000 Encounter for general adult medical examination without abnormal findings: Secondary | ICD-10-CM | POA: Diagnosis not present

## 2024-02-18 NOTE — Patient Instructions (Signed)
 [name],  Thank you for taking the time for your Medicare Wellness Visit. I appreciate your continued commitment to your health goals. Please review the care plan we discussed, and feel free to reach out if I can assist you further.  Please note that Annual Wellness Visits do not include a physical exam. Some assessments may be limited, especially if the visit was conducted virtually. If needed, we may recommend an in-person follow-up with your provider.  Ongoing Care Seeing your primary care provider every 3 to 6 months helps us  monitor your health and provide consistent, personalized care.   Referrals If a referral was made during today's visit and you haven't received any updates within two weeks, please contact the referred provider directly to check on the status.  Recommended Screenings:  Health Maintenance  Topic Date Due   Pneumococcal Vaccine for age over 61 (1 of 2 - PCV) Never done   DTaP/Tdap/Td vaccine (2 - Td or Tdap) 07/27/2019   Complete foot exam   02/21/2023   Eye exam for diabetics  05/07/2023   Zoster (Shingles) Vaccine (1 of 2) 04/18/2024*   Flu Shot  05/04/2024*   Breast Cancer Screening  06/10/2024*   COVID-19 Vaccine (5 - 2025-26 season) 02/03/2025*   Hemoglobin A1C  07/19/2024   Yearly kidney function blood test for diabetes  01/18/2025   Yearly kidney health urinalysis for diabetes  01/18/2025   Medicare Annual Wellness Visit  02/17/2025   Colon Cancer Screening  10/20/2029   Osteoporosis screening with Bone Density Scan  Completed   Hepatitis C Screening  Completed   Meningitis B Vaccine  Aged Out  *Topic was postponed. The date shown is not the original due date.       12/03/2022    7:37 AM  Advanced Directives  Does Patient Have a Medical Advance Directive? No    Vision: Annual vision screenings are recommended for early detection of glaucoma, cataracts, and diabetic retinopathy. These exams can also reveal signs of chronic conditions such as  diabetes and high blood pressure.  Dental: Annual dental screenings help detect early signs of oral cancer, gum disease, and other conditions linked to overall health, including heart disease and diabetes.  Please see the attached documents for additional preventive care recommendations.

## 2024-02-18 NOTE — Progress Notes (Signed)
 "  Chief Complaint  Patient presents with   Medicare Wellness     Subjective:   [name] is a 74 y.o. female who presents for a Medicare Annual Wellness Visit.  Visit info / Clinical Intake: Medicare Wellness Visit Type:: Initial Annual Wellness Visit Persons participating in visit and providing information:: patient Medicare Wellness Visit Mode:: In-person (required for WTM) Interpreter Needed?: No Pre-visit prep was completed: yes AWV questionnaire completed by patient prior to visit?: yes Date:: 02/17/24 Living arrangements:: (!) (Patient-Rptd) lives alone Patient's Overall Health Status Rating: (!) (Patient-Rptd) fair Typical amount of pain: (!) (Patient-Rptd) a lot Does pain affect daily life?: (!) (Patient-Rptd) yes Are you currently prescribed opioids?: (!) yes  Dietary Habits and Nutritional Risks How many meals a day?: (Patient-Rptd) 3 Eats fruit and vegetables daily?: (Patient-Rptd) yes Most meals are obtained by: (Patient-Rptd) preparing own meals In the last 2 weeks, have you had any of the following?: none Diabetic:: (!) yes Any non-healing wounds?: no How often do you check your BS?: continuous glucose monitor Would you like to be referred to a Nutritionist or for Diabetic Management? : no  Functional Status Activities of Daily Living (to include ambulation/medication): (Patient-Rptd) Independent Ambulation: Independent with device- listed below Home Assistive Devices/Equipment: Cane; Eyeglasses; Brace (specify type) (right foot) Medication Administration: (Patient-Rptd) Independent Home Management (perform basic housework or laundry): (Patient-Rptd) Independent Manage your own finances?: (Patient-Rptd) yes Primary transportation is: (Patient-Rptd) driving Concerns about vision?: no *vision screening is required for WTM* Concerns about hearing?: (!) yes (in loud rooms difficult to hear) Uses hearing aids?: no Hear whispered voice?: yes  Fall  Screening Falls in the past year?: (Patient-Rptd) 0 Number of falls in past year: 0 Was there an injury with Fall?: 0 Fall Risk Category Calculator: 0 Patient Fall Risk Level: Low Fall Risk  Fall Risk Patient at Risk for Falls Due to: Impaired balance/gait; Impaired mobility Fall risk Follow up: Falls evaluation completed  Home and Transportation Safety: All rugs have non-skid backing?: (Patient-Rptd) yes All stairs or steps have railings?: (Patient-Rptd) yes Grab bars in the bathtub or shower?: (Patient-Rptd) yes Have non-skid surface in bathtub or shower?: (!) (Patient-Rptd) no Good home lighting?: (Patient-Rptd) yes Regular seat belt use?: (Patient-Rptd) yes Hospital stays in the last year:: (Patient-Rptd) no  Cognitive Assessment Difficulty concentrating, remembering, or making decisions? : (Patient-Rptd) no Will 6CIT or Mini Cog be Completed: yes What year is it?: 0 points What month is it?: 0 points Give patient an address phrase to remember (5 components): [address] [place]  About what time is it?: 0 points Count backwards from 20 to 1: 0 points Say the months of the year in reverse: 0 points Repeat the address phrase from earlier: 0 points 6 CIT Score: 0 points  Advance Directives (For Healthcare) Does Patient Have a Medical Advance Directive?: No Would patient like information on creating a medical advance directive?: No - Patient declined  Reviewed/Updated  Reviewed/Updated: Reviewed All (Medical, Surgical, Family, Medications, Allergies, Care Teams, Patient Goals)    Allergies (verified) Ceftriaxone , Dicyclomine , Erythromycin, Iodinated contrast media, Canagliflozin, Statins, Sulfa antibiotics, Bempedoic acid , Corticosteroids, Insulin  glargine, Metformin and related, Metformin hcl, Scallops [shellfish allergy], Tramadol hcl, Cipro  [ciprofloxacin  hcl], Nsaids, Respiratory syncytial virus mrna vaccine, and Tramadol   Current Medications (verified) Outpatient  Encounter Medications as of 02/18/2024  Medication Sig   acetaminophen  (TYLENOL ) 650 MG CR tablet Take 650 mg by mouth every 8 (eight) hours as needed for pain.   acetaminophen -codeine  (TYLENOL  #3)  300-30 MG tablet Take 1 tablet by mouth every 6 (six) hours as needed for moderate pain (pain score 4-6).   albuterol  (VENTOLIN  HFA) 108 (90 Base) MCG/ACT inhaler Inhale 2 puffs into the lungs every 6 (six) hours as needed for wheezing or shortness of breath.   cholecalciferol (VITAMIN D) 1000 UNITS tablet Take 1,000 Units by mouth every evening.    Continuous Glucose Sensor (FREESTYLE LIBRE 3 PLUS SENSOR) MISC 1 each every 15 days applied to upper arm   hydrochlorothiazide  (HYDRODIURIL ) 50 MG tablet Take 1/2 (one-half) tablet by mouth twice daily   HYDROcodone -acetaminophen  (NORCO) 7.5-325 MG tablet Take 1 tablet by mouth every 6 (six) hours as needed for moderate pain (pain score 4-6).   imiquimod  (ALDARA ) 5 % cream Apply topically 3 (three) times a week.   insulin  NPH Human (NOVOLIN N) 100 UNIT/ML injection Inject 0.3 mLs (30 Units total) into the skin 2 (two) times daily before a meal. (Patient taking differently: Inject 40 Units into the skin 2 (two) times daily before a meal. Pt is taking 40 units bid)   insulin  regular (NOVOLIN R) 100 units/mL injection Inject 15 Units into the skin 3 (three) times daily before meals.   metoprolol  tartrate (LOPRESSOR ) 50 MG tablet Take 0.5 tablets (25 mg total) by mouth 2 (two) times daily.   potassium chloride  (KLOR-CON ) 8 MEQ tablet Take 8 mEq by mouth 2 (two) times daily.   PRESCRIPTION MEDICATION Patient receives a Steroid injection in the neck once a year with Dr. Cesario at [facility] pt is unable to recall the exact name and i have been unable to verify name from the Dr's office. Patient also states  it shoots up my blood sugars really high, but i still get it because i can't take anything else for my neck pain   promethazine  (PHENERGAN ) 25 MG tablet  Take 1 tablet (25 mg total) by mouth every 8 (eight) hours as needed for nausea or vomiting.   tiZANidine  (ZANAFLEX ) 4 MG tablet Take 0.5-1 tablets (2-4 mg total) by mouth every 6 (six) hours as needed for muscle spasms.   [DISCONTINUED] doxycycline  (VIBRA -TABS) 100 MG tablet Take 1 tablet (100 mg total) by mouth 2 (two) times daily.   No facility-administered encounter medications on file as of 02/18/2024.    History: Past Medical History:  Diagnosis Date   Arthritis    C. difficile colitis    Diabetes mellitus    Diverticulitis    Fibromyalgia    Hypercholesteremia    Hypertension    IBS (irritable bowel syndrome)    Migraines    Nasal fracture    Stein-[place]hal ovaries    Torn tendon    right ankle   Ulcer in my 20s; & lining of stomach badly inflamed twice due to meds - took months to heal   Past Surgical History:  Procedure Laterality Date   CHOLECYSTECTOMY  1998   KNEE ARTHROSCOPY Right 2017   KNEE ARTHROSCOPY Left    LEFT HEART CATHETERIZATION WITH CORONARY ANGIOGRAM N/A 12/19/2011   Procedure: LEFT HEART CATHETERIZATION WITH CORONARY ANGIOGRAM;  Surgeon: Oneil Parchment, MD;  Location[facility] CATH LAB;  Service: Cardiovascular;  Laterality: N/A;   ROTATOR CUFF REPAIR Right    TONSILLECTOMY  1959   Family History  Adopted: Yes  Problem Relation Age of Onset   Failure to thrive Mother    Heart failure Father    Neuropathy Neg Hx    Social History   Occupational History   Occupation:  accounting    Comment: [employer]  Tobacco Use   Smoking status: Never   Smokeless tobacco: Never  Vaping Use   Vaping status: Never Used  Substance and Sexual Activity   Alcohol use: Yes    Comment: rare-once a year at a celebration   Drug use: No   Sexual activity: Not on file   Tobacco Counseling Counseling given: Not Answered  SDOH Screenings   Food Insecurity: No Food Insecurity (02/18/2024)  Housing: Low Risk (02/18/2024)  Transportation Needs: No Transportation  Needs (02/18/2024)  Utilities: Not At Risk (02/18/2024)  Depression (PHQ2-9): Low Risk (02/18/2024)  Financial Resource Strain: Low Risk (01/15/2024)  Physical Activity: Insufficiently Active (02/18/2024)  Social Connections: Moderately Integrated (02/18/2024)  Stress: No Stress Concern Present (02/18/2024)  Tobacco Use: Low Risk (02/18/2024)  Health Literacy: Adequate Health Literacy (02/18/2024)   See flowsheets for full screening details  Depression Screen PHQ 2 & 9 Depression Scale- Over the past 2 weeks, how often have you been bothered by any of the following problems? Little interest or pleasure in doing things: 0 Feeling down, depressed, or hopeless (PHQ Adolescent also includes...irritable): 0 PHQ-2 Total Score: 0     Goals Addressed               This Visit's Progress     weight loss (pt-stated)        Weight loss             Objective:    Today's Vitals   02/18/24 0810  BP: 130/74  Pulse: 90  Temp: (!) 97.2 F (36.2 C)  SpO2: 98%  Weight: 194 lb (88 kg)  Height: 5' 5 (1.651 m)   Body mass index is 32.28 kg/m.  Hearing/Vision screen Hearing Screening - Comments:: HOH in loud rooms  Vision Screening - Comments:: Wears rx glasses - up to date with routine eye exams with Dr Devere [facility]  Immunizations and Health Maintenance Health Maintenance  Topic Date Due   Pneumococcal Vaccine: 50+ Years (1 of 2 - PCV) Never done   DTaP/Tdap/Td (2 - Td or Tdap) 07/27/2019   FOOT EXAM  02/21/2023   OPHTHALMOLOGY EXAM  05/07/2023   Zoster Vaccines- Shingrix (1 of 2) 04/18/2024 (Originally 03/14/1999)   Influenza Vaccine  05/04/2024 (Originally 09/05/2023)   Mammogram  06/10/2024 (Originally [birth year]1989)   COVID-19 Vaccine (5 - 2025-26 season) 02/03/2025 (Originally 10/06/2023)   HEMOGLOBIN A1C  07/19/2024   Diabetic kidney evaluation - eGFR measurement  01/18/2025   Diabetic kidney evaluation - Urine ACR  01/18/2025   Medicare Annual Wellness (AWV)  02/17/2025    Colonoscopy  10/20/2029   Bone Density Scan  Completed   Hepatitis C Screening  Completed   Meningococcal B Vaccine  Aged Out        Assessment/Plan:  This is a routine wellness examination for [place] Kretz.  Patient Care Team: Wendolyn Jenkins Jansky, MD as PCP - General (Family Medicine)  I have personally reviewed and noted the following in the patients chart:   Medical and social history Use of alcohol, tobacco or illicit drugs  Current medications and supplements including opioid prescriptions. Functional ability and status Nutritional status Physical activity Advanced directives List of other physicians Hospitalizations, surgeries, and ER visits in previous 12 months Vitals Screenings to include cognitive, depression, and falls Referrals and appointments  No orders of the defined types were placed in this encounter.  In addition, I have reviewed and discussed with patient certain preventive protocols, quality  metrics, and best practice recommendations. A written personalized care plan for preventive services as well as general preventive health recommendations were provided to patient.   Ellouise VEAR Haws, LPN   [id number]/85/7973   Return in about 1 year (around 02/21/2025).  After Visit Summary: (In Person-Printed) AVS printed and given to the patient  Nurse Notes: Patient advised to keep follow-up appointment with PCP (05/20/24) HM Addressed: Vaccines Due: and discussed  Diabetic Foot Exam recommended  "

## 2024-05-19 ENCOUNTER — Ambulatory Visit: Payer: Medicare (Managed Care) | Admitting: Family Medicine

## 2024-05-20 ENCOUNTER — Ambulatory Visit: Payer: Medicare (Managed Care) | Admitting: Family Medicine

## 2025-02-22 ENCOUNTER — Ambulatory Visit: Payer: Medicare (Managed Care)
